# Patient Record
Sex: Female | Born: 1990 | Race: White | Hispanic: No | Marital: Single
Health system: Midwestern US, Community
[De-identification: ages and names within clinical notes are randomized; demographics above are authoritative.]

## PROBLEM LIST (undated history)

## (undated) DIAGNOSIS — F419 Anxiety disorder, unspecified: Secondary | ICD-10-CM

## (undated) DIAGNOSIS — D649 Anemia, unspecified: Secondary | ICD-10-CM

## (undated) DIAGNOSIS — Z789 Other specified health status: Secondary | ICD-10-CM

## (undated) DIAGNOSIS — F329 Major depressive disorder, single episode, unspecified: Secondary | ICD-10-CM

## (undated) DIAGNOSIS — F32A Depression, unspecified: Secondary | ICD-10-CM

## (undated) HISTORY — DX: Other specified health status: Z78.9

## (undated) HISTORY — DX: Anxiety disorder, unspecified: F41.9

## (undated) HISTORY — PX: OTHER SURGICAL HISTORY: SHX169

## (undated) HISTORY — PX: APPENDECTOMY: SHX54

## (undated) HISTORY — DX: Major depressive disorder, single episode, unspecified: F32.9

## (undated) HISTORY — DX: Depression, unspecified: F32.A

## (undated) HISTORY — DX: Anemia, unspecified: D64.9

---
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
## 2009-07-29 ENCOUNTER — Emergency Department (HOSPITAL_COMMUNITY): Admission: EM | Admit: 2009-07-29 | Discharge: 2009-07-29 | Payer: Self-pay | Admitting: Emergency Medicine

## 2010-12-11 LAB — POCT PREGNANCY, URINE: Preg Test, Ur: NEGATIVE

## 2010-12-11 LAB — POCT I-STAT, CHEM 8
BUN: 5 mg/dL — ABNORMAL LOW (ref 6–23)
Calcium, Ion: 1.05 mmol/L — ABNORMAL LOW (ref 1.12–1.32)
Glucose, Bld: 114 mg/dL — ABNORMAL HIGH (ref 70–99)
TCO2: 22 mmol/L (ref 0–100)

## 2010-12-11 LAB — RAPID URINE DRUG SCREEN, HOSP PERFORMED
Benzodiazepines: NOT DETECTED
Cocaine: NOT DETECTED
Tetrahydrocannabinol: NOT DETECTED

## 2010-12-11 LAB — ETHANOL: Alcohol, Ethyl (B): 182 mg/dL — ABNORMAL HIGH (ref 0–10)

## 2011-01-21 NOTE — H&P (Signed)
NAMEKRISHANA, LUTZE              ACCOUNT NO.:  [id number]   MEDICAL RECORD NO.:  [id number]          PATIENT TYPE:  EMS   LOCATION:  MAJO                         FACILITY:  [facility]   PHYSICIAN:  Clovis Pu. Cornett, M.D.DATE OF BIRTH:  1991/01/27   DATE OF ADMISSION:  04/20/2008  DATE OF DISCHARGE:                              HISTORY & PHYSICAL   CHIEF COMPLAINT:  Right lower quadrant pain.   HISTORY OF PRESENT ILLNESS:  The patient is an 20 year old female with  the onset of abdominal pain yesterday at about 3 o'clock.  The pain now  is in her right lower quadrant and is sharp in nature.  It is a 7 or 8  out of 10, especially with palpation.  There is no radiation.  The pain  is not radiating.  It is sharp in nature.  It is made worse with  movement and palpation.  Denies any dysuria or back pain.  She was seen  in the emergency room, and CT scan showed acute appendicitis.  I was  asked to see her for this reason.   PAST MEDICAL HISTORY:  None.   PAST SURGICAL HISTORY:  None.   FAMILY HISTORY:  Noncontributory.   SOCIAL HISTORY:  Denies tobacco use, does drink alcohol occasionally.   ALLERGIES:  AMOXICILLIN and PENICILLIN.   MEDICATIONS:  Currently none.   REVIEW OF SYSTEMS:  Negative x15.   PHYSICAL EXAMINATION:  VITAL SIGNS:  Temperature is 97, pulse 75, blood  pressure 112/68.  GENERAL APPEARANCE:  She is a pleasant female in no distress,  accompanied by her mother.  HEENT:  Extraocular movements are intact.  Oropharynx is clear.  No  evidence of jaundice.  NECK:  Supple, nontender.  Trachea midline.  CHEST:  Lungs sounds are clear bilaterally.  Chest wall motion normal.  CARDIOVASCULAR:  Regular rate and rhythm without rub, murmur, or gallop.  Occasional PVC heard.  ABDOMEN:  Tender right lower quadrant, but positive rebound and guarding  at McBurney point.  No abscess.  No hernia.  EXTREMITIES:  Warm and well perfused.  Muscle tone normal.  Range of  motion  normal.  NEURO:  Glasgow Coma Scale is 15.  Motor and sensory functions are  grossly intact.   DIAGNOSTIC STUDIES:  I reviewed her CT scan of her abdomen and pelvis,  which showed significant amount of bowel rupture, abscess, and  perforation.   Her white count is 11,200 with no left shift, hemoglobin is 12.8.  Sodium 139, potassium 3.6, chloride 104, CO2 28, BUN 10, creatinine 0.7,  glucose 95.  Urine pregnancy test is negative.  Urinalysis is normal.   IMPRESSION:  Acute appendicitis.   PLAN:  She will need laparoscopic or open appendectomy this morning.  We  will get her admitted to the operating room for the above.  Risk of  bleeding, infection, injury to the organs, as well as a potential need  for open surgery were discussed.  I will speak with Dr. Zachery Dakins who is  the __________ to coordinate this.      Thomas A. Cornett, M.D.  Electronically Signed  TAC/MEDQ  D:  04/20/2008  T:  04/21/2008  Job:  [id number]

## 2011-01-21 NOTE — Op Note (Signed)
NAMETIAJA, HAGAN              ACCOUNT NO.:  [id number]   MEDICAL RECORD NO.:  [id number]          PATIENT TYPE:  INP   LOCATION:  3030                         FACILITY:  [facility]   PHYSICIAN:  Anselm Pancoast. Weatherly, M.D.DATE OF BIRTH:  05-18-1991   DATE OF PROCEDURE:  DATE OF DISCHARGE:                               OPERATIVE REPORT   PREOPERATIVE DIAGNOSIS:  Acute appendicitis.   POSTOPERATIVE DIAGNOSIS:  Acute appendicitis.   OPERATION:  Open appendectomy.   ANESTHESIA:  General.   SURGEON:  Anselm Pancoast. Zachery Dakins, MD   ASSISTANT:  Nurse.   HISTORY:  Donna Leach is an 20 year old female who presented to the  emergency room about midnight with about 12+ hours of increase in right  lower quadrant abdominal pain.  She was seen by the ER physician.  Lab  studies showed a mildly elevated white count.  CT showed acutely  inflamed appendix in the right lower quadrant.  It is not truly  retrocecal but it is kind of down under the tip of the cecum and I saw  her and because of her very thin structure and etc, gave the option of  whether to proceed with a laparoscopic or an open appendectomy.  She  elected for the open appendectomy and because of her size, I think we  can do it through a very small incision.  The patient had been started  on antibiotics per Dr. Luisa Hart who saw her at approximately 6 a.m. and  no additional antibiotics were given.  The patient was taken to the  operative suite, induction of general anesthesia endotracheal tube, oral  tube to the stomach, and the abdomen was prepped with Betadine solution  and draped in a sterile manner.  A small incision in the skin fold at  McBurney point area was made, sharply dissected into the skin and  subcutaneous Scarpa fascia.  The external oblique was split in direction  of its fibers.  The internal oblique and transverse side walls were  picked up and then very carefully opened into the peritoneum.  Two [employer] photographer were used and I switched these to the [employer]  and the cecum was right under the area.  We followed the cecum down and  the appendix which was not truly retrocecal but was kind of within the  mesentery medially, was kind of flipped up.  I started freeing up the  mesentery in the antegrade fashion then could bring the most distal  portion of the inflamed appendix, so I could unclamp it with Kelly's.  The  little pedicles were tied with 2-0 Vicryl.  The appendix was  inflamed on the distal 3/4th of it but not ruptured.  The base of the  appendix was crushed and tied with a 2-0 Vicryl.  The appendix removed,  stump inverted, purse-string suture with 3-0 silk and then the cecum  dropped back in its normal position.  There was minimal bleeding and the  peritoneum and transverse side wall was closed with a running 2-0  Vicryl.  The internal oblique was also with couple of stitches of  running 2-0 Vicryl.  The external oblique was closed with a running 2-0  Vicryl.  Scarpa fasc1ia was closed with a couple of 4-0 Vicryl.  Subcuticular stitches and three 1/2-inch Steri-Strips on the incision.  I measured the incision, it is approximately 3 cm in length and  hopefully the patient will be able to tolerate a liquid diet and may be  discharged later this afternoon or in the morning, hold that if she is  nauseous.  She is hoping to enroll at [employer] today, and her mother will be  talking with the people there to see if she can somehow or another be  given a delay, so she should be able to enroll tomorrow.  Sponge and  needle counts were correct.  Estimated blood loss was minimal.           ______________________________  Anselm Pancoast. Zachery Dakins, M.D.     WJW/MEDQ  D:  04/20/2008  T:  04/21/2008  Job:  [id number]

## 2011-01-24 NOTE — Discharge Summary (Signed)
NAMESIMRANJIT, Donna Leach              ACCOUNT NO.:  [id number]   MEDICAL RECORD NO.:  [id number]          PATIENT TYPE:  INP   LOCATION:  3030                         FACILITY:  [facility]   PHYSICIAN:  Anselm Pancoast. Weatherly, M.D.DATE OF BIRTH:  1990-09-26   DATE OF ADMISSION:  04/20/2008  DATE OF DISCHARGE:  04/21/2008                               DISCHARGE SUMMARY   ADMITTING PHYSICIAN:  Maisie Fus A. Cornett, M.D.   DISCHARGING PHYSICIAN:  Anselm Pancoast. Zachery Dakins, MD   REASON FOR ADMISSION:  Ms. Smoots is an 20 year old female patient  who developed right lower quadrant pain.  On the date of admission, she  presented to the ER where she was found to have clinical exam and  tenderness of the right lower quadrant consistent with acute  appendicitis.  Her white count was 11,200.  CT scan was performed that  demonstrated appendicitis with abscess and possible perforation.   The patient was admitted with following diagnoses:  Acute appendicitis,  possible perforation.   HOSPITAL COURSE:  The patient was admitted during the night by Dr.  Luisa Hart in the early morning hours of April 20, 2008, Dr. Zachery Dakins  assumed care of the patient and later took her to the operating room  where she underwent an open appendectomy for acute appendicitis.   By postop day #1, the patient was afebrile, vital signs were stable.  She was tolerating a solid diet.  Her abdomen was soft.  Bowel sounds  were present and Steri-Strips were in place over her surgical incision.  She was tolerating oral pain medications and it was determined that she  could be discharged home with follow up later after discharge.   FINAL DISCHARGE DIAGNOSIS:  Acute appendicitis, status post appendectomy  per Dr. Zachery Dakins.   DISCHARGE MEDICATIONS:  Vicodin 5/325 1-2 tablets every 4-6 hours as  needed for pain, over-the-counter ibuprofen 1-3 tabs every 8 hours as  needed for pain, may take an addition of Vicodin, always take with  food.   WOUND CARE:  May shower beginning Saturday.   DIET:  No restrictions.   ACTIVITY:  Return to school next week, otherwise, no lifting greater  than 10 pounds for 2 weeks, no driving for 1 week.   FOLLOW UP APPOINTMENT:  She is to see Dr. Zachery Dakins in the office on  May 02, 2008, at 2:45 p.m.   OTHER INSTRUCTIONS:  She is to call the surgeon if fever greater than or  equal to 101.5 degrees Fahrenheit, pain, nausea, vomiting, diarrhea, see  redness or drainage from wounds.      Allison L. Rennis Harding, N.P.    ______________________________  Anselm Pancoast. Zachery Dakins, M.D.    ALE/MEDQ  D:  05/30/2008  T:  05/31/2008  Job:  [id number]

## 2011-06-06 LAB — DIFFERENTIAL
Eosinophils Relative: 1
Lymphocytes Relative: 25
Monocytes Absolute: 0.7
Monocytes Relative: 6
Neutro Abs: 7.6

## 2011-06-06 LAB — COMPREHENSIVE METABOLIC PANEL
AST: 17
Albumin: 4.2
BUN: 10
Chloride: 104
Creatinine, Ser: 0.73
GFR calc Af Amer: >60
Potassium: 3.6
Total Bilirubin: 1
Total Protein: 6.7

## 2011-06-06 LAB — URINALYSIS, ROUTINE W REFLEX MICROSCOPIC
Nitrite: NEGATIVE
Protein, ur: NEGATIVE
Specific Gravity, Urine: 1.008
Urobilinogen, UA: 1

## 2011-06-06 LAB — CBC
HCT: 36.5
Platelets: 205
RDW: 12.2
WBC: 11.2 — ABNORMAL HIGH

## 2011-06-06 LAB — POCT PREGNANCY, URINE: Preg Test, Ur: NEGATIVE

## 2011-08-28 ENCOUNTER — Ambulatory Visit (INDEPENDENT_AMBULATORY_CARE_PROVIDER_SITE_OTHER): Payer: BC Managed Care – PPO

## 2011-08-28 DIAGNOSIS — B9789 Other viral agents as the cause of diseases classified elsewhere: Secondary | ICD-10-CM

## 2011-12-03 ENCOUNTER — Encounter (INDEPENDENT_AMBULATORY_CARE_PROVIDER_SITE_OTHER): Payer: BC Managed Care – PPO | Admitting: Obstetrics and Gynecology

## 2011-12-03 DIAGNOSIS — Z30017 Encounter for initial prescription of implantable subdermal contraceptive: Secondary | ICD-10-CM

## 2011-12-31 ENCOUNTER — Encounter: Payer: BC Managed Care – PPO | Admitting: Obstetrics and Gynecology

## 2012-01-21 ENCOUNTER — Telehealth: Payer: Self-pay

## 2012-01-21 NOTE — Telephone Encounter (Signed)
Dr. Merla Riches,  Donna Leach is in need of a letter for school stating that she has been under a great deal of stress this past semester due to her father having cancer on top of already present anxiety and depression issues.

## 2012-01-26 ENCOUNTER — Encounter: Payer: Self-pay | Admitting: Internal Medicine

## 2012-01-26 NOTE — Telephone Encounter (Signed)
I routed a letter to [place] instead of clinical messaged pool/please let them know the letters ready and we can mail it to them or mail it to school or whomever needs it

## 2012-01-27 NOTE — Telephone Encounter (Signed)
Printed letter for pt and left in p/up drawer, but LMOM for pt to CB to let us know if she wants it mailed/faxed somewhere.

## 2012-01-27 NOTE — Telephone Encounter (Signed)
Left message to return call 

## 2012-01-28 NOTE — Telephone Encounter (Signed)
Spoke w/pt and she stated she will p/up letter.

## 2012-02-10 ENCOUNTER — Encounter: Payer: Self-pay | Admitting: Obstetrics and Gynecology

## 2012-02-11 ENCOUNTER — Ambulatory Visit (INDEPENDENT_AMBULATORY_CARE_PROVIDER_SITE_OTHER): Payer: BC Managed Care – PPO | Admitting: Obstetrics and Gynecology

## 2012-02-11 ENCOUNTER — Encounter: Payer: Self-pay | Admitting: Obstetrics and Gynecology

## 2012-02-11 VITALS — BP 98/52 | Wt 128.0 lb

## 2012-02-11 DIAGNOSIS — Z309 Encounter for contraceptive management, unspecified: Secondary | ICD-10-CM

## 2012-02-11 DIAGNOSIS — N390 Urinary tract infection, site not specified: Secondary | ICD-10-CM

## 2012-02-11 LAB — POCT URINALYSIS DIPSTICK
Bilirubin, UA: 1
Glucose, UA: NEGATIVE
Ketones, UA: NEGATIVE
Spec Grav, UA: 1.015
pH, UA: 5

## 2012-02-11 NOTE — Progress Notes (Signed)
F/u on nexplanon 12/03/11 Reports irregular spotting now improving C/O bladder pressure: U/A trace leukocytes now resolved  No other complaints Nexplanon nicely positioned in left arm  F-U AEX

## 2014-01-24 ENCOUNTER — Ambulatory Visit (INDEPENDENT_AMBULATORY_CARE_PROVIDER_SITE_OTHER): Payer: BC Managed Care – PPO | Admitting: Physician Assistant

## 2014-01-24 VITALS — BP 112/84 | HR 71 | Temp 99.1°F | Resp 20 | Ht 68.0 in | Wt 132.4 lb

## 2014-01-24 DIAGNOSIS — R350 Frequency of micturition: Secondary | ICD-10-CM

## 2014-01-24 DIAGNOSIS — R300 Dysuria: Secondary | ICD-10-CM

## 2014-01-24 DIAGNOSIS — N390 Urinary tract infection, site not specified: Secondary | ICD-10-CM

## 2014-01-24 DIAGNOSIS — R109 Unspecified abdominal pain: Secondary | ICD-10-CM

## 2014-01-24 LAB — POCT URINALYSIS DIPSTICK
Bilirubin, UA: NEGATIVE
Glucose, UA: NEGATIVE
Ketones, UA: NEGATIVE
Leukocytes, UA: NEGATIVE
Nitrite, UA: POSITIVE
Protein, UA: 30
Spec Grav, UA: 1.025
Urobilinogen, UA: 1
pH, UA: 5.5

## 2014-01-24 LAB — POCT CBC
Granulocyte percent: 63.6 %G (ref 37–80)
HCT, POC: 39.3 % (ref 37.7–47.9)
Hemoglobin: 12.9 g/dL (ref 12.2–16.2)
Lymph, poc: 2.1 (ref 0.6–3.4)
MCH, POC: 33 pg — AB (ref 27–31.2)
MCHC: 32.8 g/dL (ref 31.8–35.4)
MCV: 100.4 fL — AB (ref 80–97)
MID (cbc): 0.3 (ref 0–0.9)
MPV: 8.8 fL (ref 0–99.8)
POC Granulocyte: 4.3 (ref 2–6.9)
POC LYMPH PERCENT: 31.3 %L (ref 10–50)
POC MID %: 5.1 %M (ref 0–12)
Platelet Count, POC: 260 10*3/uL (ref 142–424)
RBC: 3.91 M/uL — AB (ref 4.04–5.48)
RDW, POC: 12.5 %
WBC: 6.7 10*3/uL (ref 4.6–10.2)

## 2014-01-24 LAB — POCT UA - MICROSCOPIC ONLY
Casts, Ur, LPF, POC: NEGATIVE
Crystals, Ur, HPF, POC: NEGATIVE
Mucus, UA: NEGATIVE
Yeast, UA: NEGATIVE

## 2014-01-24 MED ORDER — PHENAZOPYRIDINE HCL 200 MG PO TABS: 200.0000 mg | ORAL_TABLET | Freq: Three times a day (TID) | ORAL | Status: DC | PRN

## 2014-01-24 MED ORDER — CIPROFLOXACIN HCL 250 MG PO TABS: 250.0000 mg | ORAL_TABLET | Freq: Two times a day (BID) | ORAL | Status: DC

## 2014-01-24 NOTE — Progress Notes (Signed)
Subjective:    Patient ID: Donna SacramentoLena Leach, female    DOB: 06/01/1991, 23 y.o.   MRN: [id number]  HPI 23 year old female presents for evaluation of 1 month history of worsening UTI sx's.  States symptoms include urinary frequency and dysuria.  For the past 2-3 days, she has had significant worsening of symptoms to include flank pain, chills, subjective fever, and urinary urgency.  Denies nausea or vomiting.  Hx of UTI's in the past - takes cranberry juice tablets as needed. Last diagnosed UTI 2 years ago.   Patient is otherwise doing well with no other concerns today.     Review of Systems  Constitutional: Positive for fever (subjective) and chills.  Gastrointestinal: Positive for abdominal pain. Negative for nausea and vomiting.  Genitourinary: Positive for dysuria, frequency and flank pain.       Objective:   Physical Exam  Constitutional: She is oriented to person, place, and time. She appears well-developed and well-nourished.  HENT:  Head: Normocephalic and atraumatic.  Right Ear: External ear normal.  Left Ear: External ear normal.  Eyes: Conjunctivae are normal.  Neck: Normal range of motion.  Cardiovascular: Normal rate.   Pulmonary/Chest: Effort normal.  Abdominal: Soft. Bowel sounds are normal. There is no tenderness. There is CVA tenderness (minimal tenderness on right side). There is no rebound and no guarding.  Neurological: She is alert and oriented to person, place, and time.  Psychiatric: She has a normal mood and affect. Her behavior is normal. Judgment and thought content normal.     Results for orders placed in visit on 01/24/14  POCT CBC      Result Value Ref Range   WBC 6.7  4.6 - 10.2 K/uL   Lymph, poc 2.1  0.6 - 3.4   POC LYMPH PERCENT 31.3  10 - 50 %L   MID (cbc) 0.3  0 - 0.9   POC MID % 5.1  0 - 12 %M   POC Granulocyte 4.3  2 - 6.9   Granulocyte percent 63.6  37 - 80 %G   RBC 3.91 (*) 4.04 - 5.48 M/uL   Hemoglobin 12.9  12.2 - 16.2 g/dL   HCT, POC  40.939.3  81.137.7 - 47.9 %   MCV 100.4 (*) 80 - 97 fL   MCH, POC 33.0 (*) 27 - 31.2 pg   MCHC 32.8  31.8 - 35.4 g/dL   RDW, POC 91.412.5     Platelet Count, POC 260  142 - 424 K/uL   MPV 8.8  0 - 99.8 fL  POCT URINALYSIS DIPSTICK      Result Value Ref Range   Color, UA yellow     Clarity, UA clear     Glucose, UA neg     Bilirubin, UA neg     Ketones, UA neg     Spec Grav, UA 1.025     Blood, UA moderate     pH, UA 5.5     Protein, UA 30     Urobilinogen, UA 1.0     Nitrite, UA pos     Leukocytes, UA Negative    POCT UA - MICROSCOPIC ONLY      Result Value Ref Range   WBC, Ur, HPF, POC 1-5     RBC, urine, microscopic 4-12     Bacteria, U Microscopic 1+     Mucus, UA neg     Epithelial cells, urine per micros 0-3     Crystals, Ur, HPF, POC  neg     Casts, Ur, LPF, POC neg     Yeast, UA neg           Assessment & Plan:  Flank pain - Plan: POCT CBC, POCT urinalysis dipstick, POCT UA - Microscopic Only  Dysuria - Plan: POCT urinalysis dipstick, POCT UA - Microscopic Only  Urinary frequency - Plan: POCT urinalysis dipstick, POCT UA - Microscopic Only  Will treat as UTI - possible early pyelonephritis - with Cipro 250 mg bid x 7 days Pyridium 200 mg q8hours prn pain Push fluids Urine culture sent RTC precautions discussed.  Follow up if symptoms worsen or fail to improve.

## 2014-01-27 LAB — URINE CULTURE: Colony Count: 80000

## 2015-02-24 ENCOUNTER — Encounter (HOSPITAL_COMMUNITY): Payer: Self-pay | Admitting: *Deleted

## 2015-02-24 ENCOUNTER — Emergency Department (HOSPITAL_COMMUNITY)
Admission: EM | Admit: 2015-02-24 | Discharge: 2015-02-25 | Disposition: A | Discharge status: Home / Self Care | Payer: BLUE CROSS/BLUE SHIELD | Attending: Emergency Medicine | Admitting: Emergency Medicine

## 2015-02-24 DIAGNOSIS — S00402A Unspecified superficial injury of left ear, initial encounter: Secondary | ICD-10-CM | POA: Diagnosis present

## 2015-02-24 DIAGNOSIS — Y998 Other external cause status: Secondary | ICD-10-CM | POA: Diagnosis not present

## 2015-02-24 DIAGNOSIS — Z8659 Personal history of other mental and behavioral disorders: Secondary | ICD-10-CM | POA: Insufficient documentation

## 2015-02-24 DIAGNOSIS — Z880 Allergy status to penicillin: Secondary | ICD-10-CM | POA: Insufficient documentation

## 2015-02-24 DIAGNOSIS — Z720 Tobacco use: Secondary | ICD-10-CM | POA: Diagnosis not present

## 2015-02-24 DIAGNOSIS — W16812A Jumping or diving into other water striking water surface causing other injury, initial encounter: Secondary | ICD-10-CM | POA: Insufficient documentation

## 2015-02-24 DIAGNOSIS — S0922XA Traumatic rupture of left ear drum, initial encounter: Secondary | ICD-10-CM | POA: Diagnosis not present

## 2015-02-24 DIAGNOSIS — Y9315 Activity, underwater diving and snorkeling: Secondary | ICD-10-CM | POA: Diagnosis not present

## 2015-02-24 DIAGNOSIS — Y9289 Other specified places as the place of occurrence of the external cause: Secondary | ICD-10-CM | POA: Diagnosis not present
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
## 2015-02-25 MED ORDER — OFLOXACIN 0.3 % OT SOLN: 5.0000 [drp] | Freq: Two times a day (BID) | OTIC | Status: AC

## 2015-02-25 NOTE — Discharge Instructions (Signed)
You have a perforated ear drum on left ear.  Follow instruction below. If you are having worsening pain or fever, then use ear drops as prescribed.  Follow up with ENT as needed.  When shower, place a cotton ball with vaseline ointment to ear canal to help keep ear clean.  Take tylenol or ibuprofen as needed for pain.  Tympanic Membrane Perforation The eardrum (tympanic membrane) protects the inner ear from the outside environment. In addition to protection, the eardrum allows you to hear by transmitting sound waves to the bones in your ear and then to the nervous system. The tympanic membrane is easily perforated, which may result in damage to the inner ear. SYMPTOMS   Sometimes there are no symptoms.  Decreased hearing.  Fluid drainage from ear.  Ear pain. CAUSES   Most commonly, a middle ear infection from built-up pressure.  Injury from a cotton swab.  Traumatic injury to the side of the head. RISK INCREASES WITH:  Frequent middle ear infections.  Use of cotton swabs. PREVENTION   Do not use cotton swabs to clean the ear canal.  If you have ear pain or pressure, see your caregiver to rule out an ear infection that needs treatment. TREATMENT  Protecting the inner ear and allowing the membrane to heal on its own is how tympanic membrane rupture is usually treated. Healing may take several weeks. In order to protect the inner ear, do not allow any fluid to enter the ear canal. Avoid being submerged in water. The use of ear drops may prevent an ear infection from developing, but they should be used with caution, as ear drops can also cause damage to the inner ear. It is important to follow up with your caregiver to confirm healing of the tympanic membrane. If the membrane does not heal, permanent hearing loss may occur. To avoid serious complications, tympanic membranes that do not heal on their own are repaired with surgery. Document Released: 08/25/2005 Document Revised: 11/17/2011  Document Reviewed: 12/07/2008 [facility] Patient Information 2015 [place], [place]. This information is not intended to replace advice given to you by your health care provider. Make sure you discuss any questions you have with your health care provider.

## 2015-08-06 ENCOUNTER — Encounter: Payer: Self-pay | Admitting: Internal Medicine

## 2016-03-12 ENCOUNTER — Ambulatory Visit (INDEPENDENT_AMBULATORY_CARE_PROVIDER_SITE_OTHER): Payer: PRIVATE HEALTH INSURANCE | Admitting: Family Medicine

## 2016-03-12 VITALS — BP 114/78 | HR 89 | Temp 98.2°F | Resp 18 | Ht 68.0 in | Wt 130.0 lb

## 2016-03-12 DIAGNOSIS — R599 Enlarged lymph nodes, unspecified: Secondary | ICD-10-CM

## 2016-03-12 DIAGNOSIS — J029 Acute pharyngitis, unspecified: Secondary | ICD-10-CM

## 2016-03-12 DIAGNOSIS — R591 Generalized enlarged lymph nodes: Secondary | ICD-10-CM

## 2016-03-12 LAB — POCT RAPID STREP A (OFFICE): Rapid Strep A Screen: NEGATIVE

## 2016-03-12 NOTE — Patient Instructions (Addendum)
IF you received an x-ray today, you will receive an invoice from [facility]. Please contact [facility] at [phone] with questions or concerns regarding your invoice.   IF you received labwork today, you will receive an invoice from [facility]. Please contact [facility] at [phone] with questions or concerns regarding your invoice.   Our billing staff will not be able to assist you with questions regarding bills from these companies.  You will be contacted with the lab results as soon as they are available. The fastest way to get your results is to activate your My Chart account. Instructions are located on the last page of this paperwork. If you have not heard from us regarding the results in 2 weeks, please contact this office.    Your strep test was negative, so I will send out the throat culture. For now try sore throat lozenges, Tylenol or Motrin as needed, and fluids as much as possible. If the swollen lymph node is not improving in the next 2 weeks, could consider other testing or treatment, or return sooner if increase in size or worsening.  Return to the clinic or go to the nearest emergency room if any of your symptoms worsen or new symptoms occur.  Sore Throat A sore throat is pain, burning, irritation, or scratchiness of the throat. There is often pain or tenderness when swallowing or talking. A sore throat may be accompanied by other symptoms, such as coughing, sneezing, fever, and swollen neck glands. A sore throat is often the first sign of another sickness, such as a cold, flu, strep throat, or mononucleosis (commonly known as mono). Most sore throats go away without medical treatment. CAUSES  The most common causes of a sore throat include:  A viral infection, such as a cold, flu, or mono.  A bacterial infection, such as strep throat, tonsillitis, or whooping cough.  Seasonal allergies.  Dryness in the  air.  Irritants, such as smoke or pollution.  Gastroesophageal reflux disease (GERD). HOME CARE INSTRUCTIONS   Only take over-the-counter medicines as directed by your caregiver.  Drink enough fluids to keep your urine clear or pale yellow.  Rest as needed.  Try using throat sprays, lozenges, or sucking on hard candy to ease any pain (if older than 4 years or as directed).  Sip warm liquids, such as broth, herbal tea, or warm water with honey to relieve pain temporarily. You may also eat or drink cold or frozen liquids such as frozen ice pops.  Gargle with salt water (mix 1 tsp salt with 8 oz of water).  Do not smoke and avoid secondhand smoke.  Put a cool-mist humidifier in your bedroom at night to moisten the air. You can also turn on a hot shower and sit in the bathroom with the door closed for 5-10 minutes. SEEK IMMEDIATE MEDICAL CARE IF:  You have difficulty breathing.  You are unable to swallow fluids, soft foods, or your saliva.  You have increased swelling in the throat.  Your sore throat does not get better in 7 days.  You have nausea and vomiting.  You have a fever or persistent symptoms for more than 2-3 days.  You have a fever and your symptoms suddenly get worse. MAKE SURE YOU:   Understand these instructions.  Will watch your condition.  Will get help right away if you are not doing well or get worse.   This information is not intended to replace advice given to  you by your health care provider. Make sure you discuss any questions you have with your health care provider.   Document Released: 10/02/2004 Document Revised: 09/15/2014 Document Reviewed: 05/02/2012 Elsevier Interactive Patient Education 2016 Elsevier Inc.  Lymphadenopathy Lymphadenopathy refers to swollen or enlarged lymph glands, also called lymph nodes. Lymph glands are part of your body's defense (immune) system, which protects the body from infections, germs, and diseases. Lymph glands  are found in many locations in your body, including the neck, underarm, and groin.  Many things can cause lymph glands to become enlarged. When your immune system responds to germs, such as viruses or bacteria, infection-fighting cells and fluid build up. This causes the glands to grow in size. Usually, this is not something to worry about. The swelling and any soreness often go away without treatment. However, swollen lymph glands can also be caused by a number of diseases. Your health care provider may do various tests to help determine the cause. If the cause of your swollen lymph glands cannot be found, it is important to monitor your condition to make sure the swelling goes away. HOME CARE INSTRUCTIONS Watch your condition for any changes. The following actions may help to lessen any discomfort you are feeling:  Get plenty of rest.  Take medicines only as directed by your health care provider. Your health care provider may recommend over-the-counter medicines for pain.  Apply moist heat compresses to the site of swollen lymph nodes as directed by your health care provider. This can help reduce any pain.  Check your lymph nodes daily for any changes.  Keep all follow-up visits as directed by your health care provider. This is important. SEEK MEDICAL CARE IF:  Your lymph nodes are still swollen after 2 weeks.  Your swelling increases or spreads to other areas.  Your lymph nodes are hard, seem fixed to the skin, or are growing rapidly.  Your skin over the lymph nodes is red and inflamed.  You have a fever.  You have chills.  You have fatigue.  You develop a sore throat.  You have abdominal pain.  You have weight loss.  You have night sweats. SEEK IMMEDIATE MEDICAL CARE IF:  You notice fluid leaking from the area of the enlarged lymph node.  You have severe pain in any area of your body.  You have chest pain.  You have shortness of breath.   This information is not  intended to replace advice given to you by your health care provider. Make sure you discuss any questions you have with your health care provider.   Document Released: 06/03/2008 Document Revised: 09/15/2014 Document Reviewed: 03/30/2014 Elsevier Interactive Patient Education [employer]2016 Elsevier Inc.

## 2016-03-12 NOTE — Progress Notes (Signed)
By signing my name below I, Shelah Lewandowsky, attest that this documentation has been prepared under the direction and in the presence of Shade Flood, MD. Electonically Signed. Shelah Lewandowsky, Scribe 03/12/2016 at 5:15 PM  Subjective:    Patient ID: Donna Leach, female    DOB: [birth year], 25 y.o.   MRN: [id number]  Chief Complaint  Patient presents with  . Sore Throat    x 1day    HPI Donna Leach is a 25 y.o. female who presents to the [facility] complaining of sore throat for the past day. Pt also reports bilat ear soreness. Pt denies any fever. Pt denies any known sick contacts. Pt has been drinking fluids. Pt states she coughed up a minor amount of blood once this morning. Pt denies any runny nose or nasal congestion. Pt denies trying any OTC medications or honey or teas.  Pt also reports having swollen lymph node on the left side of her neck that was first noticed after the sore throat started.   Pt denies any recent cat scratch or bite. Pt also denies any scratches or rashes.  PT states that she is only taking 10mg  zoloft. Pt states that she occasionally uses her trazodone at night.    Pt works as a Manufacturing engineer.  There are no active problems to display for this patient.  Past Medical History  Diagnosis Date  . No pertinent past medical history   . Depression    Past Surgical History  Procedure Laterality Date  . Appendectomy    . Adnoids     Allergies  Allergen Reactions  . Amoxicillin Hives  . Penicillins Hives  . Suprax [Cefixime]    Prior to Admission medications   Medication Sig Start Date End Date Taking? Authorizing Provider  etonogestrel (NEXPLANON) 68 MG IMPL implant Inject 1 each into the skin once. 11/11/11  Yes Historical Provider, MD  sertraline (ZOLOFT) 25 MG tablet Take 25 mg by mouth daily.   Yes Historical Provider, MD  sertraline (ZOLOFT) 50 MG tablet Take 50 mg by mouth daily. 02/18/16   Historical Provider, MD    traZODone (DESYREL) 50 MG tablet TAKE 1 TO 3 TABLETS AT BEDTIME 02/18/16   Historical Provider, MD   Social History   Social History  . Marital Status: Single    Spouse Name: N/A  . Number of Children: N/A  . Years of Education: N/A   Occupational History  . Not on file.   Social History Main Topics  . Smoking status: Current Some Day Smoker -- 2 years  . Smokeless tobacco: Never Used     Comment: 3 cigs per day  . Alcohol Use: 0.5 oz/week    1 drink(s) per week  . Drug Use: No  . Sexual Activity: Yes    Birth Control/ Protection: Implant   Other Topics Concern  . Not on file   Social History Narrative      Review of Systems  Constitutional: Negative for fever.  HENT: Positive for ear pain and sore throat. Negative for congestion and rhinorrhea.   Respiratory: Positive for cough.   Skin: Negative for rash.  Hematological: Positive for adenopathy.       Objective:   Physical Exam  Constitutional: She is oriented to person, place, and time. She appears well-developed and well-nourished. No distress.  HENT:  Head: Normocephalic and atraumatic.  Right Ear: Hearing, tympanic membrane, external ear and ear canal normal.  Left Ear: Hearing and external  ear normal. Tympanic membrane is injected (minimal).  Nose: Nose normal.  Mouth/Throat: Posterior oropharyngeal erythema (minimal L>R) present. No oropharyngeal exudate.  No tonsillar hypertrophy.   Eyes: Conjunctivae and EOM are normal. Pupils are equal, round, and reactive to light.  Cardiovascular: Normal rate, regular rhythm, normal heart sounds and intact distal pulses.   No murmur heard. Pulmonary/Chest: Effort normal and breath sounds normal. No respiratory distress. She has no wheezes. She has no rhonchi.  Lymphadenopathy:  Pt has a few small, scattered, mobile lymph nodes on the left side of her neck behind the SCM.  Neurological: She is alert and oriented to person, place, and time.  Skin: Skin is warm and  dry. No rash noted.  Psychiatric: She has a normal mood and affect. Her behavior is normal.  Vitals reviewed.    Filed Vitals:   03/12/16 1614  BP: 114/78  Pulse: 89  Temp: 98.2 F (36.8 C)  TempSrc: Oral  Resp: 18  Height: 5\' 8"  (1.727 m)  Weight: 130 lb (58.968 kg)  SpO2: 98%   Results for orders placed or performed in visit on 03/12/16  POCT rapid strep A  Result Value Ref Range   Rapid Strep A Screen Negative Negative         Assessment & Plan:   Donna Leach is a 25 y.o. female Sore throat  Lymphadenopathy of head and neck - Plan: Culture, Group A Strep, POCT rapid strep A  Small mobile lymphadenopathy left neck, associated ear pain and sore throat. Possible early viral illness versus less likely also negative strep testing.   -Check throat culture. Symptomatic care discussed, RTC precautions given in after visit summary. Meds ordered this encounter  Medications  . sertraline (ZOLOFT) 25 MG tablet    Sig: Take 25 mg by mouth daily.  Marland Kitchen. DISCONTD: sertraline (ZOLOFT) 50 MG tablet    Sig: Take 50 mg by mouth daily.    Refill:  12  . traZODone (DESYREL) 50 MG tablet    Sig: TAKE 1 TO 3 TABLETS AT BEDTIME    Refill:  12   Patient Instructions       IF you received an x-ray today, you will receive an invoice from [facility]. Please contact [facility] at [phone] with questions or concerns regarding your invoice.   IF you received labwork today, you will receive an invoice from [facility]. Please contact [facility] at [phone] with questions or concerns regarding your invoice.   Our billing staff will not be able to assist you with questions regarding bills from these companies.  You will be contacted with the lab results as soon as they are available. The fastest way to get your results is to activate your My Chart account. Instructions are located on the last page of this paperwork. If you have not heard  from us regarding the results in 2 weeks, please contact this office.    Your strep test was negative, so I will send out the throat culture. For now try sore throat lozenges, Tylenol or Motrin as needed, and fluids as much as possible. If the swollen lymph node is not improving in the next 2 weeks, could consider other testing or treatment, or return sooner if increase in size or worsening.  Return to the clinic or go to the nearest emergency room if any of your symptoms worsen or new symptoms occur.  Sore Throat A sore throat is pain, burning, irritation, or scratchiness of the throat. There is  often pain or tenderness when swallowing or talking. A sore throat may be accompanied by other symptoms, such as coughing, sneezing, fever, and swollen neck glands. A sore throat is often the first sign of another sickness, such as a cold, flu, strep throat, or mononucleosis (commonly known as mono). Most sore throats go away without medical treatment. CAUSES  The most common causes of a sore throat include:  A viral infection, such as a cold, flu, or mono.  A bacterial infection, such as strep throat, tonsillitis, or whooping cough.  Seasonal allergies.  Dryness in the air.  Irritants, such as smoke or pollution.  Gastroesophageal reflux disease (GERD). HOME CARE INSTRUCTIONS   Only take over-the-counter medicines as directed by your caregiver.  Drink enough fluids to keep your urine clear or pale yellow.  Rest as needed.  Try using throat sprays, lozenges, or sucking on hard candy to ease any pain (if older than 4 years or as directed).  Sip warm liquids, such as broth, herbal tea, or warm water with honey to relieve pain temporarily. You may also eat or drink cold or frozen liquids such as frozen ice pops.  Gargle with salt water (mix 1 tsp salt with 8 oz of water).  Do not smoke and avoid secondhand smoke.  Put a cool-mist humidifier in your bedroom at night to moisten the air. You  can also turn on a hot shower and sit in the bathroom with the door closed for 5-10 minutes. SEEK IMMEDIATE MEDICAL CARE IF:  You have difficulty breathing.  You are unable to swallow fluids, soft foods, or your saliva.  You have increased swelling in the throat.  Your sore throat does not get better in 7 days.  You have nausea and vomiting.  You have a fever or persistent symptoms for more than 2-3 days.  You have a fever and your symptoms suddenly get worse. MAKE SURE YOU:   Understand these instructions.  Will watch your condition.  Will get help right away if you are not doing well or get worse.   This information is not intended to replace advice given to you by your health care provider. Make sure you discuss any questions you have with your health care provider.   Document Released: 10/02/2004 Document Revised: 09/15/2014 Document Reviewed: 05/02/2012 Elsevier Interactive Patient Education 2016 Elsevier Inc.  Lymphadenopathy Lymphadenopathy refers to swollen or enlarged lymph glands, also called lymph nodes. Lymph glands are part of your body's defense (immune) system, which protects the body from infections, germs, and diseases. Lymph glands are found in many locations in your body, including the neck, underarm, and groin.  Many things can cause lymph glands to become enlarged. When your immune system responds to germs, such as viruses or bacteria, infection-fighting cells and fluid build up. This causes the glands to grow in size. Usually, this is not something to worry about. The swelling and any soreness often go away without treatment. However, swollen lymph glands can also be caused by a number of diseases. Your health care provider may do various tests to help determine the cause. If the cause of your swollen lymph glands cannot be found, it is important to monitor your condition to make sure the swelling goes away. HOME CARE INSTRUCTIONS Watch your condition for any  changes. The following actions may help to lessen any discomfort you are feeling:  Get plenty of rest.  Take medicines only as directed by your health care provider. Your health care provider may  recommend over-the-counter medicines for pain.  Apply moist heat compresses to the site of swollen lymph nodes as directed by your health care provider. This can help reduce any pain.  Check your lymph nodes daily for any changes.  Keep all follow-up visits as directed by your health care provider. This is important. SEEK MEDICAL CARE IF:  Your lymph nodes are still swollen after 2 weeks.  Your swelling increases or spreads to other areas.  Your lymph nodes are hard, seem fixed to the skin, or are growing rapidly.  Your skin over the lymph nodes is red and inflamed.  You have a fever.  You have chills.  You have fatigue.  You develop a sore throat.  You have abdominal pain.  You have weight loss.  You have night sweats. SEEK IMMEDIATE MEDICAL CARE IF:  You notice fluid leaking from the area of the enlarged lymph node.  You have severe pain in any area of your body.  You have chest pain.  You have shortness of breath.   This information is not intended to replace advice given to you by your health care provider. Make sure you discuss any questions you have with your health care provider.   Document Released: 06/03/2008 Document Revised: 09/15/2014 Document Reviewed: 03/30/2014 Elsevier Interactive Patient Education [employer] Elsevier Inc.     I personally performed the services described in this documentation, which was scribed in my presence. The recorded information has been reviewed and considered, and addended by me as needed.   Signed,   Meredith StaggersJeffrey Kadelyn Dimascio, MD [facility].  03/12/2016 5:24 PM

## 2016-03-14 LAB — CULTURE, GROUP A STREP: ORGANISM ID, BACTERIA: NORMAL

## 2016-04-08 ENCOUNTER — Ambulatory Visit (INDEPENDENT_AMBULATORY_CARE_PROVIDER_SITE_OTHER): Payer: PRIVATE HEALTH INSURANCE | Admitting: Family Medicine

## 2016-04-08 VITALS — BP 110/72 | HR 73 | Temp 98.3°F | Resp 16 | Ht 68.0 in | Wt 129.0 lb

## 2016-04-08 DIAGNOSIS — R590 Localized enlarged lymph nodes: Secondary | ICD-10-CM | POA: Insufficient documentation

## 2016-04-08 DIAGNOSIS — R599 Enlarged lymph nodes, unspecified: Secondary | ICD-10-CM | POA: Diagnosis not present

## 2016-04-08 DIAGNOSIS — R5383 Other fatigue: Secondary | ICD-10-CM

## 2016-04-08 DIAGNOSIS — F329 Major depressive disorder, single episode, unspecified: Secondary | ICD-10-CM

## 2016-04-08 DIAGNOSIS — R490 Dysphonia: Secondary | ICD-10-CM

## 2016-04-08 DIAGNOSIS — F32A Depression, unspecified: Secondary | ICD-10-CM

## 2016-04-08 NOTE — Assessment & Plan Note (Signed)
Persistent LAD with ongoing symptoms, will check CBC, epstein-barre, HIV.  Depression flare considered as well.  Follow up next week.   

## 2016-04-08 NOTE — Patient Instructions (Addendum)
  Please follow up 1 week to go over lab work.  Avoid contact with pregnant women for time being.  Continue fluids/rest.   IF you received an x-ray today, you will receive an invoice from [facility]. Please contact [facility] at [phone] with questions or concerns regarding your invoice.   IF you received labwork today, you will receive an invoice from [facility]. Please contact [facility] at [phone] with questions or concerns regarding your invoice.   Our billing staff will not be able to assist you with questions regarding bills from these companies.  You will be contacted with the lab results as soon as they are available. The fastest way to get your results is to activate your My Chart account. Instructions are located on the last page of this paperwork. If you have not heard from [place] regarding the results in 2 weeks, please contact this office.

## 2016-04-08 NOTE — Progress Notes (Signed)
Subjective:    Patient ID: Donna Leach, female    DOB: [birth year], 25 y.o.   MRN: [id number]  HPI Presents in follow up for lymphadenopathy and continuation of fatigue and some upper respiratory symptoms.  She reports the lymph nodes have not changed in size.  She endorses some ear pain, but less than previous and minimal sore throat. She has noticed that her voice has become more hoarse than previous.  She denies cough, chest pain.  Has been feeling more fatigue when climbing stairs.  No fevers, chills, change in weight, bone pain.  She is a smoker.  She is sexually active with 1 partner and they have been monogamous for years.  She has had only 1 sexual partner.   She has a history of depression and has a psychiatrist that she sees on a regular basis.  She denies SI, HI.  She states that she feels more depressed when she does not exercise as much.  She recently went on some trips over the past couple weekends, which were in different time zones.  She does not feel as though she has completely adjusted to being back home.     Review of Systems  Constitutional: Positive for fatigue. Negative for chills and fever.  HENT: Negative for congestion and rhinorrhea.   Respiratory: Positive for chest tightness. Negative for cough and shortness of breath.   Cardiovascular: Negative for chest pain and leg swelling.  Gastrointestinal: Negative for abdominal pain and nausea.  Genitourinary: Negative for dysuria, urgency and vaginal discharge.  Musculoskeletal: Negative for arthralgias, joint swelling and myalgias.  Skin: Negative for rash and wound.  Psychiatric/Behavioral: Negative for agitation and confusion.  All other systems reviewed and are negative.      Objective:   Physical Exam  Constitutional: She is oriented to person, place, and time. She appears well-developed and well-nourished. No distress.  HENT:  Head: Normocephalic and atraumatic.  Right Ear: External ear normal.  Left Ear:  External ear normal.  Mild erythema in posterior oropharynx TM without effusion, retraction Nares without erythema, discharge  Neck: Normal range of motion. Neck supple.  0.5cm cervical LN in posterior cervical chain with 2 0.2 cm LN superior on the left No LAD in right cervical chain, pre/postauricular, occipital, supraclavicular  Cardiovascular: Normal rate, regular rhythm, normal heart sounds and intact distal pulses.  Exam reveals no gallop and no friction rub.   No murmur heard. Pulmonary/Chest: Effort normal and breath sounds normal. No respiratory distress. She has no wheezes. She has no rales. She exhibits no tenderness.  Musculoskeletal: Normal range of motion. She exhibits no edema.  Lymphadenopathy:    She has cervical adenopathy.  Neurological: She is alert and oriented to person, place, and time.  Skin: Skin is warm. No rash noted. She is not diaphoretic. No erythema.  Psychiatric: She has a normal mood and affect. Her behavior is normal. Judgment and thought content normal.  Nursing note and vitals reviewed.  BP 110/72 (BP Location: Right Arm, Patient Position: Sitting, Cuff Size: Normal)   Pulse 73   Temp 98.3 F (36.8 C)   Resp 16   Ht  (1.727 m)   Wt 129 lb (58.5 kg)   LMP 04/08/2016   SpO2 100%   BMI 19.61 kg/m         Assessment & Plan:  Lymphadenopathy of left cervical region Persistent LAD with ongoing symptoms, will check CBC, epstein-barBrock Mokryepression flare considered as well.  Follow up next  week.    Hoarseness Will assess next week how this is doing and see if she needs to go to ENT, given that she is a smoker.    Other fatigue Persistent LAD with ongoing symptoms, will check CBC, epstein-barre, HIV.  Depression flare considered as well.  Follow up next week.    Depression This is managed by outside psychiatrist, her screen was positive because she has not been active and this is abnormal for her.

## 2016-04-08 NOTE — Assessment & Plan Note (Addendum)
Persistent LAD with ongoing symptoms, will check CBC, epstein-barre, HIV.  Depression flare considered as well.  Follow up next week.

## 2016-04-08 NOTE — Assessment & Plan Note (Signed)
This is managed by outside psychiatrist, her screen was positive because she has not been active and this is abnormal for her.

## 2016-04-08 NOTE — Assessment & Plan Note (Signed)
Will assess next week how this is doing and see if she needs to go to ENT, given that she is a smoker.

## 2016-04-09 LAB — CBC WITH DIFFERENTIAL/PLATELET
BASOS PCT: 0 %
Basophils Absolute: 0 cells/uL (ref 0–200)
EOS PCT: 1 %
Eosinophils Absolute: 83 cells/uL (ref 15–500)
HEMATOCRIT: 40.3 % (ref 35.0–45.0)
Hemoglobin: 13.9 g/dL (ref 11.7–15.5)
LYMPHS ABS: 2656 {cells}/uL (ref 850–3900)
LYMPHS PCT: 32 %
MCH: 33.5 pg — ABNORMAL HIGH (ref 27.0–33.0)
MCHC: 34.5 g/dL (ref 32.0–36.0)
MCV: 97.1 fL (ref 80.0–100.0)
MPV: 9.7 fL (ref 7.5–12.5)
Monocytes Absolute: 664 cells/uL (ref 200–950)
Monocytes Relative: 8 %
NEUTROS PCT: 59 %
Neutro Abs: 4897 cells/uL (ref 1500–7800)
Platelets: 254 10*3/uL (ref 140–400)
RBC: 4.15 MIL/uL (ref 3.80–5.10)
RDW: 12.8 % (ref 11.0–15.0)
WBC: 8.3 10*3/uL (ref 3.8–10.8)

## 2016-04-09 LAB — HIV ANTIBODY (ROUTINE TESTING W REFLEX): HIV 1&2 Ab, 4th Generation: NONREACTIVE

## 2016-04-09 NOTE — Progress Notes (Signed)
Note reviewed, and agree with documentation and plan. Patient examined with Dr. Zachery Dauer. Agree with assessment and plan of care per her note.   Signed,   Meredith Staggers, MD [facility].  04/09/16 8:44 AM

## 2016-04-10 LAB — EPSTEIN-BARR VIRUS VCA ANTIBODY PANEL
EBV NA IGG: 26.2 U/mL — AB
EBV VCA IGG: 149 U/mL — AB

## 2016-04-11 LAB — CYTOMEGALOVIRUS ANTIBODY, IGG: Cytomegalovirus Ab-IgG: <0.6 U/mL (ref <0.60)

## 2016-04-14 ENCOUNTER — Telehealth: Payer: Self-pay

## 2016-04-15 NOTE — Addendum Note (Signed)
Addended by: Cardell PeachHITANAND, Mirtie Bastyr B on: 04/15/2016 02:46 PM   Modules accepted: Orders

## 2016-04-16 NOTE — Telephone Encounter (Signed)
Phone message placed 

## 2016-07-14 ENCOUNTER — Telehealth: Payer: Self-pay

## 2016-07-14 DIAGNOSIS — R590 Localized enlarged lymph nodes: Secondary | ICD-10-CM

## 2016-07-14 NOTE — Telephone Encounter (Signed)
Patient is requesting a referral to ENT.   States she had one, we were unable to reach her so it was cancelled.    Please call her at [phone] ([phone]

## 2016-07-17 NOTE — Telephone Encounter (Signed)
Referrals, please complete referral again.  See phone message.

## 2016-10-02 ENCOUNTER — Ambulatory Visit (INDEPENDENT_AMBULATORY_CARE_PROVIDER_SITE_OTHER): Payer: PRIVATE HEALTH INSURANCE | Admitting: Physician Assistant

## 2016-10-02 VITALS — BP 122/80 | HR 75 | Temp 98.2°F | Resp 18 | Ht 68.0 in | Wt 134.2 lb

## 2016-10-02 DIAGNOSIS — R300 Dysuria: Secondary | ICD-10-CM

## 2016-10-02 DIAGNOSIS — R350 Frequency of micturition: Secondary | ICD-10-CM | POA: Diagnosis not present

## 2016-10-02 DIAGNOSIS — N3001 Acute cystitis with hematuria: Secondary | ICD-10-CM

## 2016-10-02 LAB — POCT URINALYSIS DIP (MANUAL ENTRY)
BILIRUBIN UA: NEGATIVE
BILIRUBIN UA: NEGATIVE
GLUCOSE UA: NEGATIVE
Nitrite, UA: POSITIVE — AB
Spec Grav, UA: 1.02
Urobilinogen, UA: 1
pH, UA: 7.5

## 2016-10-02 LAB — POC MICROSCOPIC URINALYSIS (UMFC): MUCUS RE: ABSENT

## 2016-10-02 LAB — POCT URINE PREGNANCY: PREG TEST UR: NEGATIVE

## 2016-10-02 MED ORDER — NITROFURANTOIN MONOHYD MACRO 100 MG PO CAPS: 100.0000 mg | ORAL_CAPSULE | Freq: Two times a day (BID) | ORAL | 0 refills | Status: DC

## 2016-10-02 NOTE — Progress Notes (Signed)
10/02/2016 2:34 PM   DOB: 11/15/1990 / MRN: [id number]  SUBJECTIVE:  Donna Leach is a 26 y.o. female presenting for urinary frequency along with some improved dysuria 2/2 to azo.  Denies fever, chills, nausea.  Denies changes in appetite.  Feels that she is getting worse.    She is concerned that she may have the flu as she has been exposed to her boss.   She is allergic to amoxicillin; penicillins; and suprax [cefixime].   She  has a past medical history of Depression and No pertinent past medical history.    She  reports that she has been smoking.  She has smoked for the past 2.00 years. She has never used smokeless tobacco. She reports that she drinks about 0.5 oz of alcohol per week . She reports that she does not use drugs. She  reports that she currently engages in sexual activity. She reports using the following method of birth control/protection: Implant. The patient  has a past surgical history that includes Appendectomy and Adnoids.  Her family history includes Diabetes in her father and mother; Hypertension in her father.  Review of Systems  Constitutional: Negative for chills and fever.  Respiratory: Negative for cough.   Gastrointestinal: Negative for nausea.  Genitourinary: Positive for dysuria, frequency, hematuria and urgency. Negative for flank pain.  Musculoskeletal: Positive for myalgias.  Skin: Negative for rash.  Neurological: Negative for dizziness.    The problem list and medications were reviewed and updated by myself where necessary and exist elsewhere in the encounter.   OBJECTIVE:  BP 122/80   Pulse 75   Temp 98.2 F (36.8 C) (Oral)   Resp 18   Ht 5\' 8"  (1.727 m)   Wt 134 lb 3.2 oz (60.9 kg)   SpO2 96%   BMI 20.41 kg/m   Physical Exam  Constitutional: She is oriented to person, place, and time.  Cardiovascular: Normal rate and regular rhythm.   Pulmonary/Chest: Effort normal and breath sounds normal.  Abdominal: Soft. Bowel sounds are normal.  She exhibits no distension and no mass. There is no tenderness. There is no rebound, no guarding and no CVA tenderness.  Musculoskeletal: Normal range of motion.  Neurological: She is alert and oriented to person, place, and time. No cranial nerve deficit.  Skin: Skin is warm and dry.  Psychiatric: She has a normal mood and affect.  Vitals reviewed.   Results for orders placed or performed in visit on 10/02/16 (from the past 72 hour(s))  POCT urinalysis dipstick     Status: Abnormal   Collection Time: 10/02/16  2:08 PM  Result Value Ref Range   Color, UA yellow yellow   Clarity, UA cloudy (A) clear   Glucose, UA negative negative   Bilirubin, UA negative negative   Ketones, POC UA negative negative   Spec Grav, UA 1.020    Blood, UA small (A) negative   pH, UA 7.5    Protein Ur, POC =100 (A) negative   Urobilinogen, UA 1.0    Nitrite, UA Positive (A) Negative   Leukocytes, UA small (1+) (A) Negative  POCT Microscopic Urinalysis ([facility])     Status: Abnormal   Collection Time: 10/02/16  2:18 PM  Result Value Ref Range   WBC,UR,HPF,POC Many (A) None WBC/hpf   RBC,UR,HPF,POC Few (A) None RBC/hpf   Bacteria Many (A) None, Too numerous to count   Mucus Absent Absent   Epithelial Cells, UR Per Microscopy Moderate (A) None, Too numerous to count cells/hpf  POCT urine pregnancy     Status: None   Collection Time: 10/02/16  2:18 PM  Result Value Ref Range   Preg Test, Ur Negative Negative    No results found.  ASSESSMENT AND PLAN:  [name] was seen today for hematuria, urinary frequency and back pain.  Diagnoses and all orders for this visit:  Dysuria -     POCT urinalysis dipstick -     POCT Microscopic Urinalysis ([facility])  Urinary frequency -     POCT urinalysis dipstick -     POCT Microscopic Urinalysis ([facility])  Acute cystitis with hematuria -     POCT urine pregnancy -     Urine culture -     nitrofurantoin, macrocrystal-monohydrate, (MACROBID) 100 MG capsule; Take 1  capsule (100 mg total) by mouth 2 (two) times daily.    The patient is advised to call or return to clinic if she does not see an improvement in symptoms, or to seek the care of the closest emergency department if she worsens with the above plan.   Deliah Boston, MHS, PA-C [facility] 10/02/2016 2:34 PM

## 2016-10-02 NOTE — Patient Instructions (Signed)
     IF you received an x-ray today, you will receive an invoice from [facility]. Please contact [facility] at [phone] with questions or concerns regarding your invoice.   IF you received labwork today, you will receive an invoice from [facility]. Please contact [facility] at [phone] with questions or concerns regarding your invoice.   Our billing staff will not be able to assist you with questions regarding bills from these companies.  You will be contacted with the lab results as soon as they are available. The fastest way to get your results is to activate your My Chart account. Instructions are located on the last page of this paperwork. If you have not heard from us regarding the results in 2 weeks, please contact this office.     

## 2016-10-04 LAB — URINE CULTURE

## 2016-11-06 ENCOUNTER — Ambulatory Visit (INDEPENDENT_AMBULATORY_CARE_PROVIDER_SITE_OTHER): Payer: PRIVATE HEALTH INSURANCE | Admitting: Emergency Medicine

## 2016-11-06 VITALS — BP 102/68 | HR 67 | Temp 98.2°F | Resp 16 | Ht 68.0 in | Wt 137.0 lb

## 2016-11-06 DIAGNOSIS — S6000XA Contusion of unspecified finger without damage to nail, initial encounter: Secondary | ICD-10-CM

## 2016-11-06 DIAGNOSIS — S60022A Contusion of left index finger without damage to nail, initial encounter: Secondary | ICD-10-CM

## 2016-11-06 NOTE — Progress Notes (Signed)
Donna Leach 26 y.o.   Chief Complaint  Patient presents with  . Bleeding/Bruising    Left hand 2nd digit, hit against weight, feels tingly per pt    HISTORY OF PRESENT ILLNESS: This is a 26 y.o. female complaining of injury to left index finger that happened yesterday.  Hand Pain   The incident occurred 2 days ago. The injury mechanism was a direct blow. The pain is present in the left fingers. The quality of the pain is described as aching. The pain does not radiate. The pain is at a severity of 4/10. The pain has been intermittent since the incident. Associated symptoms include tingling.     Prior to Admission medications   Medication Sig Start Date End Date Taking? Authorizing Provider  etonogestrel (NEXPLANON) 68 MG IMPL implant Inject 1 each into the skin once. 11/11/11  Yes Historical Provider, MD  sertraline (ZOLOFT) 25 MG tablet Take 25 mg by mouth daily.   Yes Historical Provider, MD  traZODone (DESYREL) 50 MG tablet TAKE 1 TO 3 TABLETS AT BEDTIME 02/18/16  Yes Historical Provider, MD  nitrofurantoin, macrocrystal-monohydrate, (MACROBID) 100 MG capsule Take 1 capsule (100 mg total) by mouth 2 (two) times daily. Patient not taking: Reported on 11/06/2016 10/02/16   Donna Neas, PA-C  Phenazopyridine HCl (AZO TABS PO) Take by mouth.    Historical Provider, MD    Allergies  Allergen Reactions  . Amoxicillin Hives  . Penicillins Hives  . Suprax [Cefixime]     Patient Active Problem List   Diagnosis Date Noted  . Traumatic hematoma of finger 11/06/2016  . Depression 04/08/2016    Past Medical History:  Diagnosis Date  . Depression   . No pertinent past medical history     Past Surgical History:  Procedure Laterality Date  . Adnoids    . APPENDECTOMY      Social History   Social History  . Marital status: Single    Spouse name: N/A  . Number of children: N/A  . Years of education: N/A   Occupational History  . Not on file.   Social History Main  Topics  . Smoking status: Current Some Day Smoker    Years: 2.00  . Smokeless tobacco: Never Used     Comment: 3 cigs per day  . Alcohol use 0.5 oz/week    1 drink(s) per week  . Drug use: No  . Sexual activity: Yes    Birth control/ protection: Implant   Other Topics Concern  . Not on file   Social History Narrative  . No narrative on file    Family History  Problem Relation Age of Onset  . Hypertension Father   . Diabetes Father   . Diabetes Mother      Review of Systems  Constitutional: Negative for chills and fever.  Respiratory: Negative for shortness of breath.   Gastrointestinal: Negative for nausea and vomiting.  Musculoskeletal:       +pain left index finger  Skin:       +bruising  Neurological: Positive for tingling.  All other systems reviewed and are negative.  Vitals:   11/06/16 1515  BP: 102/68  Pulse: 67  Resp: 16  Temp: 98.2 F (36.8 C)     Physical Exam  Constitutional: She is oriented to person, place, and time. She appears well-developed and well-nourished.  HENT:  Head: Normocephalic and atraumatic.  Eyes: EOM are normal. Pupils are equal, round, and reactive to light.  Neck: Normal  range of motion. Neck supple.  Cardiovascular: Normal rate.   Pulmonary/Chest: Effort normal.  Musculoskeletal:  Left index finger: +proximal and lateral tender hematoma with abrasion; NVI with FROM; mild swelling  Neurological: She is alert and oriented to person, place, and time.  Skin: Skin is warm and dry. Capillary refill takes less than 2 seconds.  Psychiatric: She has a normal mood and affect. Her behavior is normal.  Vitals reviewed.    ASSESSMENT & PLAN: Donna Leach was seen today for bleeding/bruising.  Diagnoses and all orders for this visit:  Contusion of left index finger without damage to nail, initial encounter  Traumatic hematoma of finger, initial encounter    Patient Instructions       IF you received an x-ray today, you will  [facility]. Please contact [facility] at [phone] with questions or concerns regarding your invoice.   IF you received labwork today, you will receive an invoice from [facility]. Please contact [facility] at [phone] with questions or concerns regarding your invoice.   Our billing staff will not be able to assist you with questions regarding bills from these companies.  You will be contacted with the lab results as soon as they are available. The fastest way to get your results is to activate your My Chart account. Instructions are located on the last page of this paperwork. If you have not heard from us regarding the results in 2 weeks, please contact this office.     Hematoma A hematoma is a collection of blood. The collection of blood can turn into a hard, painful lump under the skin. Your skin may turn blue or yellow if the hematoma is close to the surface of the skin. Most hematomas get better in a few days to weeks. Some hematomas are serious and need medical care. Hematomas can be very small or very big. Follow these instructions at home:  Apply ice to the injured area:  Put ice in a plastic bag.  Place a towel between your skin and the bag.  Leave the ice on for 20 minutes, 2-3 times a day for the first 1 to 2 days.  After the first 2 days, switch to using warm packs on the injured area.  Raise (elevate) the injured area to lessen pain and puffiness (swelling). You may also wrap the area with an elastic bandage. Make sure the bandage is not wrapped too tight.  If you have a painful hematoma on your leg or foot, you may use crutches for a couple days.  Only take medicines as told by your doctor. Get help right away if:  Your pain gets worse.  Your pain is not controlled with medicine.  You have a fever.  Your puffiness gets worse.  Your skin turns more blue or yellow.  Your skin over the hematoma breaks or starts  bleeding.  Your hematoma is in your chest or belly (abdomen) and you are short of breath, feel weak, or have a change in consciousness.  Your hematoma is on your scalp and you have a headache that gets worse or a change in alertness or consciousness. This information is not intended to replace advice given to you by your health care provider. Make sure you discuss any questions you have with your health care provider. Document Released: 10/02/2004 Document Revised: 01/31/2016 Document Reviewed: 02/02/2013 Elsevier Interactive Patient Education  2017 Elsevier Inc.      Edwina BarthMiguel Reagen Goates, MD [facility]

## 2016-11-06 NOTE — Patient Instructions (Addendum)
     IF you received an x-ray today, you will receive an invoice from [facility]. Please contact [facility] at [phone] with questions or concerns regarding your invoice.   IF you received labwork today, you will receive an invoice from [facility]. Please contact [facility] at [phone] with questions or concerns regarding your invoice.   Our billing staff will not be able to assist you with questions regarding bills from these companies.  You will be contacted with the lab results as soon as they are available. The fastest way to get your results is to activate your My Chart account. Instructions are located on the last page of this paperwork. If you have not heard from us regarding the results in 2 weeks, please contact this office.      Hematoma A hematoma is a collection of blood. The collection of blood can turn into a hard, painful lump under the skin. Your skin may turn blue or yellow if the hematoma is close to the surface of the skin. Most hematomas get better in a few days to weeks. Some hematomas are serious and need medical care. Hematomas can be very small or very big. Follow these instructions at home:  Apply ice to the injured area:  Put ice in a plastic bag.  Place a towel between your skin and the bag.  Leave the ice on for 20 minutes, 2-3 times a day for the first 1 to 2 days.  After the first 2 days, switch to using warm packs on the injured area.  Raise (elevate) the injured area to lessen pain and puffiness (swelling). You may also wrap the area with an elastic bandage. Make sure the bandage is not wrapped too tight.  If you have a painful hematoma on your leg or foot, you may use crutches for a couple days.  Only take medicines as told by your doctor. Get help right away if:  Your pain gets worse.  Your pain is not controlled with medicine.  You have a fever.  Your puffiness gets worse.  Your skin turns more blue or  yellow.  Your skin over the hematoma breaks or starts bleeding.  Your hematoma is in your chest or belly (abdomen) and you are short of breath, feel weak, or have a change in consciousness.  Your hematoma is on your scalp and you have a headache that gets worse or a change in alertness or consciousness. This information is not intended to replace advice given to you by your health care provider. Make sure you discuss any questions you have with your health care provider. Document Released: 10/02/2004 Document Revised: 01/31/2016 Document Reviewed: 02/02/2013 Elsevier Interactive Patient Education  2017 Elsevier Inc.  

## 2016-11-18 ENCOUNTER — Ambulatory Visit (INDEPENDENT_AMBULATORY_CARE_PROVIDER_SITE_OTHER): Payer: PRIVATE HEALTH INSURANCE

## 2016-11-18 ENCOUNTER — Ambulatory Visit (INDEPENDENT_AMBULATORY_CARE_PROVIDER_SITE_OTHER): Payer: PRIVATE HEALTH INSURANCE | Admitting: Physician Assistant

## 2016-11-18 VITALS — BP 112/68 | HR 63 | Temp 97.7°F | Resp 16 | Ht 68.0 in | Wt 138.0 lb

## 2016-11-18 DIAGNOSIS — M79602 Pain in left arm: Secondary | ICD-10-CM | POA: Diagnosis not present

## 2016-11-18 DIAGNOSIS — M79601 Pain in right arm: Secondary | ICD-10-CM

## 2016-11-18 LAB — POCT CBC
Granulocyte percent: 53.2 %G (ref 37–80)
HEMATOCRIT: 37.6 % — AB (ref 37.7–47.9)
HEMOGLOBIN: 13.3 g/dL (ref 12.2–16.2)
Lymph, poc: 2.7 (ref 0.6–3.4)
MCH, POC: 34.1 pg — AB (ref 27–31.2)
MCHC: 35.4 g/dL (ref 31.8–35.4)
MCV: 96 fL (ref 80–97)
MID (cbc): 0.4 (ref 0–0.9)
MPV: 7.7 fL (ref 0–99.8)
POC GRANULOCYTE: 3.5 (ref 2–6.9)
POC LYMPH PERCENT: 40.7 %L (ref 10–50)
POC MID %: 6.1 % (ref 0–12)
Platelet Count, POC: 204 10*3/uL (ref 142–424)
RBC: 3.92 M/uL — AB (ref 4.04–5.48)
RDW, POC: 12.1 %
WBC: 6.6 10*3/uL (ref 4.6–10.2)

## 2016-11-18 LAB — POCT URINALYSIS DIP (MANUAL ENTRY)
Bilirubin, UA: NEGATIVE
Blood, UA: NEGATIVE
GLUCOSE UA: NEGATIVE
Ketones, POC UA: NEGATIVE
NITRITE UA: NEGATIVE
PROTEIN UA: NEGATIVE
Spec Grav, UA: 1.01
UROBILINOGEN UA: 0.2
pH, UA: 7

## 2016-11-18 LAB — POCT URINE PREGNANCY: Preg Test, Ur: NEGATIVE

## 2016-11-18 IMAGING — DX DG CERVICAL SPINE COMPLETE 4+V
5 series · 5 of 5 positions shown · non-contrast
Comparison: None

CLINICAL DATA: BILATERAL arm pain for 4 days

EXAM:
CERVICAL SPINE - COMPLETE 4+ VIEW

[c-spine lat]
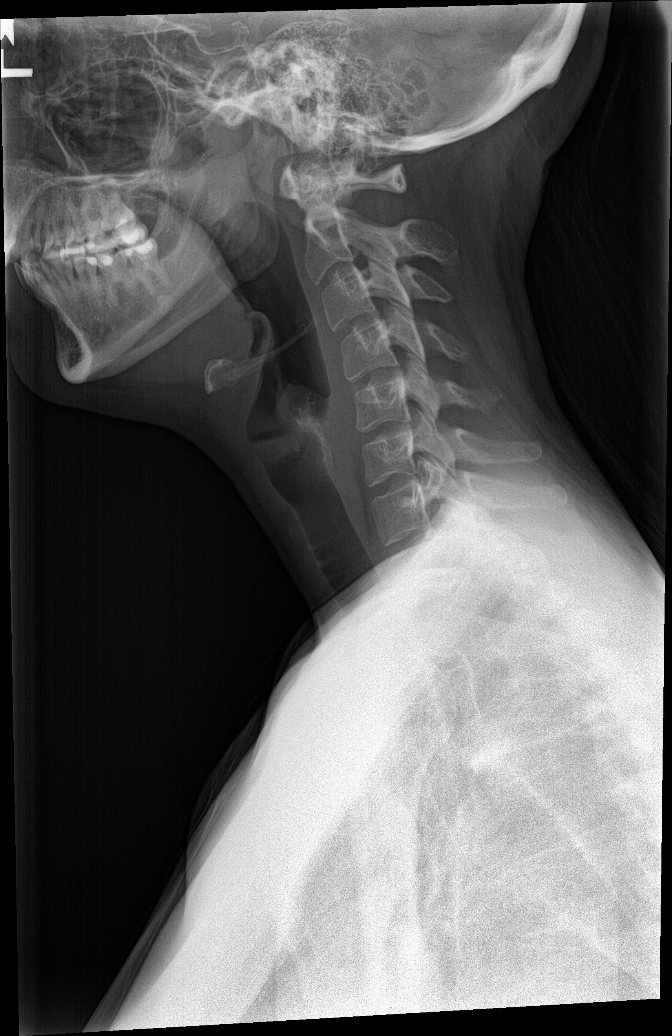

[c-spine obl (1 of 2)]
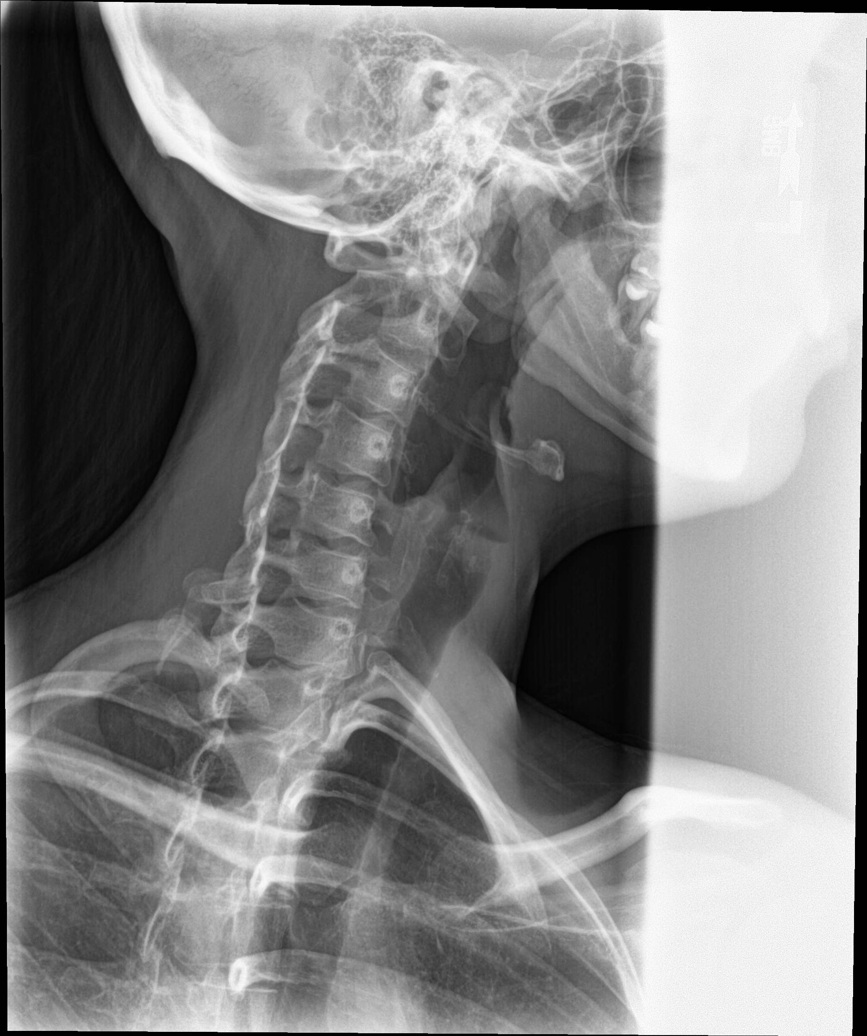

[c-spine obl (2 of 2)]
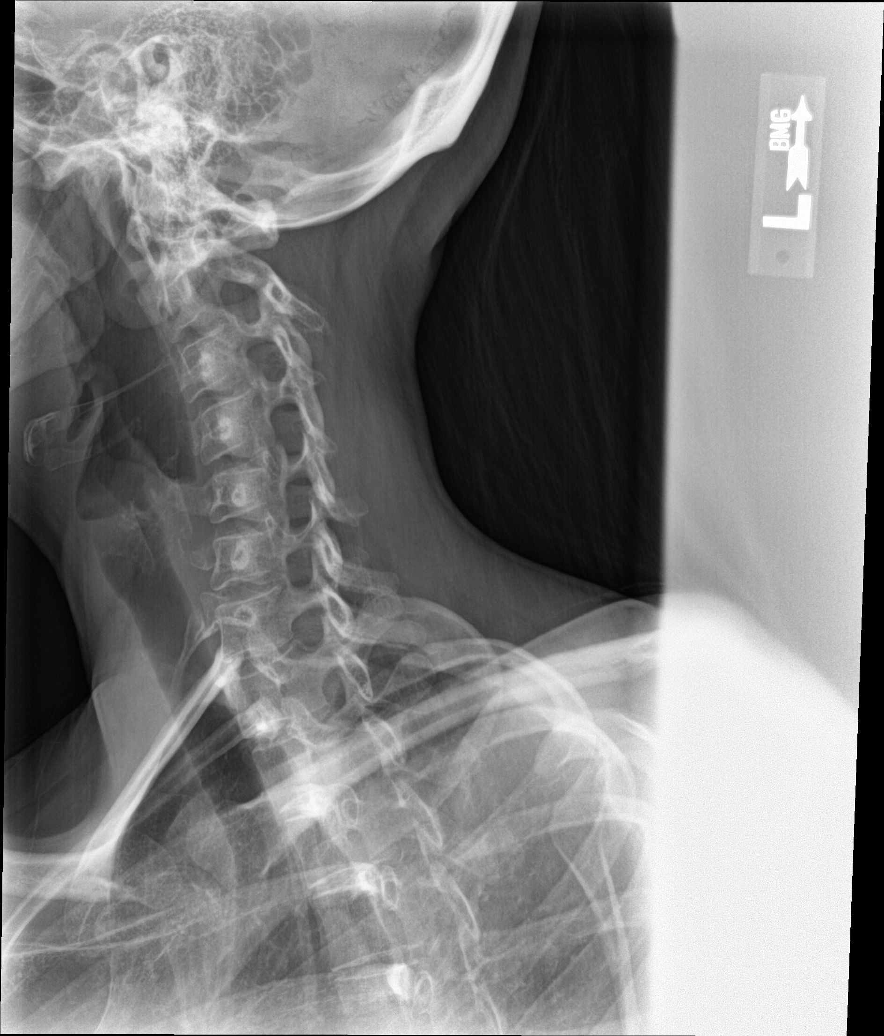

[c-spine ap]
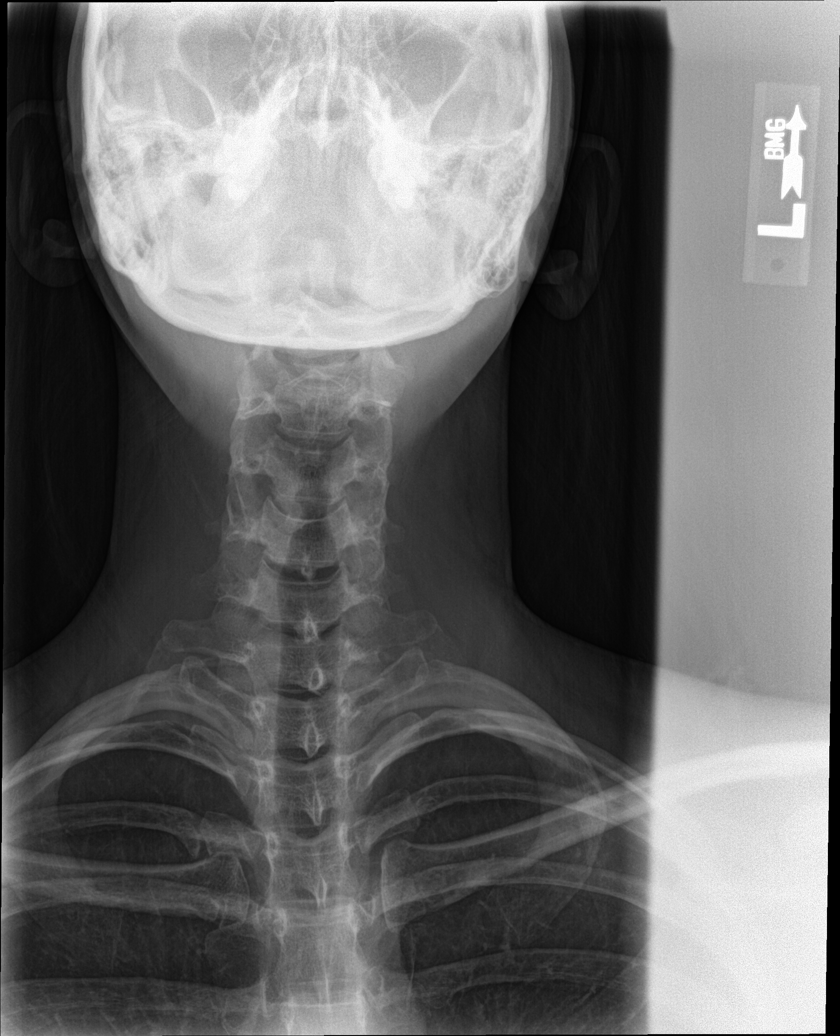

[c-spine open mouth]
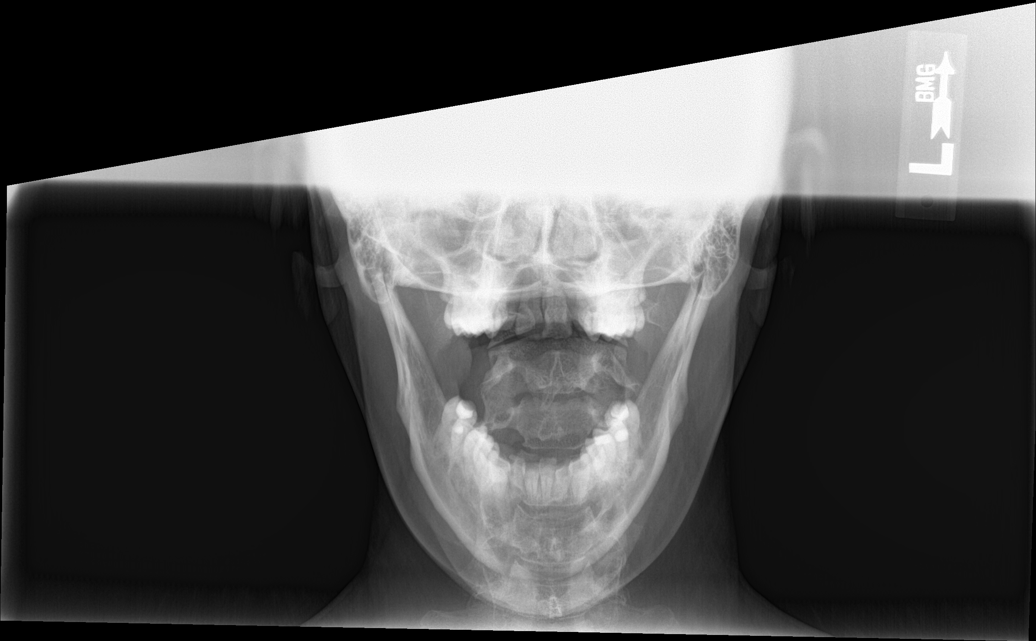

[5 of 5 positions shown; findings below may reference images not displayed]

FINDINGS: Reversal of cervical lordosis question muscle spasm.

Osseous mineralization normal for technique.

Prevertebral soft tissues normal thickness.

Vertebral body and disc space heights maintained.

Bony foramina patent.

No acute fracture, subluxation or bone destruction.

Lung apices appear clear
IMPRESSION: Question muscle spasm ; otherwise negative exam.

## 2016-11-18 MED ORDER — NAPROXEN 500 MG PO TABS: 500.0000 mg | ORAL_TABLET | Freq: Two times a day (BID) | ORAL | 0 refills | Status: DC

## 2016-11-18 MED ORDER — CYCLOBENZAPRINE HCL 10 MG PO TABS: 5.0000 mg | ORAL_TABLET | Freq: Three times a day (TID) | ORAL | 0 refills | Status: DC | PRN

## 2016-11-18 NOTE — Progress Notes (Signed)
11/21/2016 9:22 AM   DOB: 05-[name]-1991 / MRN: [id number]  SUBJECTIVE:  Donna Leach is a 26 y.o. female presenting for bilateral arm pain from the shoulders to the wrists. This started on 3 days ago. She describes the pain as sore muscles and she tells me she has never felt this way before.  She associates tension about the neck and thoracic muscle tightness along with neck pain. Arm abduction makes the pain worse.  Also feels that the arms are "deflated." She did do some hot yoga on Saturday but tells me this is nothing new for her.  She denies any other strenuous/new physical activities, however she does tell me she is very physically active.   Denies any new medications or supplements.   She is allergic to amoxicillin; penicillins; and suprax [cefixime].   She  has a past medical history of Depression and No pertinent past medical history.    She  reports that she has been smoking.  She has smoked for the past 2.00 years. She has never used smokeless tobacco. She reports that she drinks about 0.5 oz of alcohol per week . She reports that she does not use drugs. She  reports that she currently engages in sexual activity. She reports using the following method of birth control/protection: Implant. The patient  has a past surgical history that includes Appendectomy and Adnoids.  Her family history includes Diabetes in her father and mother; Hypertension in her father.  Review of Systems  Constitutional: Negative for chills and fever.  HENT: Negative for sore throat.   Musculoskeletal: Positive for back pain, myalgias and neck pain. Negative for falls and joint pain.  Skin: Negative for rash.  Neurological: Negative for dizziness, focal weakness and headaches.    The problem list and medications were reviewed and updated by myself where necessary and exist elsewhere in the encounter.   OBJECTIVE:   BP 112/68   Pulse 63   Temp 97.7 F (36.5 C) (Oral)   Resp 16   Ht _0  (1.727 m)   Wt  138 lb (62.6 kg)   SpO2 100%   BMI 20.98 kg/m   Physical Exam  Constitutional: She is oriented to person, place, and time. She appears well-nourished. No distress.  Eyes: EOM are normal. Pupils are equal, round, and reactive to light.  Cardiovascular: Normal rate.   Pulmonary/Chest: Effort normal.  Abdominal: She exhibits no distension.  Musculoskeletal:       Cervical back: She exhibits tenderness (upper trapeziuss bilaterally), pain and spasm. She exhibits no bony tenderness, no swelling, no edema and no deformity.       Right upper arm: Normal.       Left upper arm: Normal.       Right forearm: Normal.       Left forearm: Normal.  Neurological: She is alert and oriented to person, place, and time. No cranial nerve deficit. Gait normal.  Skin: Skin is dry. She is not diaphoretic.  Psychiatric: She has a normal mood and affect.  Vitals reviewed.   Results for orders placed or performed in visit on 11/18/16 (from the past 72 hour(s))  CMP14+EGFR     Status: None   Collection Time: 11/18/16  3:57 PM  Result Value Ref Range   Glucose 87 65 - 99 mg/dL   BUN 17 6 - 20 mg/dL   Creatinine, Ser 0.78 0.57 - 1.00 mg/dL   GFR calc non Af Amer 105 >59 mL/min/1.73   GFR calc Af  Amer 120 >59 mL/min/1.73   BUN/Creatinine Ratio 22 9 - 23   Sodium 139 134 - 144 mmol/L   Potassium 4.0 3.5 - 5.2 mmol/L   Chloride 101 96 - 106 mmol/L   CO2 28 18 - 29 mmol/L   Calcium 9.2 8.7 - 10.2 mg/dL   Total Protein 6.9 6.0 - 8.5 g/dL   Albumin 4.4 3.5 - 5.5 g/dL   Globulin, Total 2.5 1.5 - 4.5 g/dL   Albumin/Globulin Ratio 1.8 1.2 - 2.2   Bilirubin Total 0.6 0.0 - 1.2 mg/dL   Alkaline Phosphatase 40 39 - 117 IU/L   AST 15 0 - 40 IU/L   ALT 13 0 - 32 IU/L  CK     Status: None   Collection Time: 11/18/16  3:57 PM  Result Value Ref Range   Total CK 95 24 - 173 U/L  POCT urine pregnancy     Status: None   Collection Time: 11/18/16  4:07 PM  Result Value Ref Range   Preg Test, Ur Negative Negative   POCT urinalysis dipstick     Status: Abnormal   Collection Time: 11/18/16  4:11 PM  Result Value Ref Range   Color, UA yellow yellow   Clarity, UA clear clear   Glucose, UA negative negative   Bilirubin, UA negative negative   Ketones, POC UA negative negative   Spec Grav, UA 1.010 1.003, 1.005, 1.010, 1.015, 1.020, 1.025, 1.030, 1.035   Blood, UA negative negative   pH, UA 7.0 5.0, 5.5, 6.0, 6.5, 7.0, 7.5, 8.0   Protein Ur, POC negative negative   Urobilinogen, UA 0.2 0.2, 1.0   Nitrite, UA Negative Negative   Leukocytes, UA Trace (A) Negative  POCT CBC     Status: Abnormal   Collection Time: 11/18/16  4:11 PM  Result Value Ref Range   WBC 6.6 4.6 - 10.2 K/uL   Lymph, poc 2.7 0.6 - 3.4   POC LYMPH PERCENT 40.7 10 - 50 %L   MID (cbc) 0.4 0 - 0.9   POC MID % 6.1 0 - 12 %M   POC Granulocyte 3.5 2 - 6.9   Granulocyte percent 53.2 37 - 80 %G   RBC 3.92 (A) 4.04 - 5.48 M/uL   Hemoglobin 13.3 12.2 - 16.2 g/dL   HCT, POC 37.6 (A) 37.7 - 47.9 %   MCV 96.0 80 - 97 fL   MCH, POC 34.1 (A) 27 - 31.2 pg   MCHC 35.4 31.8 - 35.4 g/dL   RDW, POC 12.1 %   Platelet Count, POC 204 142 - 424 K/uL   MPV 7.7 0 - 99.8 fL    No results found.  ASSESSMENT AND PLAN: [id number]   Shearon was seen today for arm pain.  Diagnoses and all orders for this visit:  Bilateral arm pain: She has an odd complaint, however her exam is normal, there is no protein in her urine, and she has loss of the cervical lordosis suggesting this is possibly radicular in nature.  Will screen a CK however this is very low likelihood.   -     DG Cervical Spine Complete; Future -     POCT SEDIMENTATION RATE -     POCT urine pregnancy -     POCT urinalysis dipstick -     POCT CBC -     CMP14+EGFR -     CK -     cyclobenzaprine (FLEXERIL) 10 MG tablet; Take 0.5-1 tablets (5-10 mg total)  by mouth 3 (three) times daily as needed for muscle spasms. -     naproxen (NAPROSYN) 500 MG tablet; Take 1 tablet (500 mg total) by  mouth 2 (two) times daily with a meal.    The patient is advised to call or return to clinic if she does not see an improvement in symptoms, or to seek the care of the closest emergency department if she worsens with the above plan.   Philis Fendt, MHS, PA-C [facility] 11/21/2016 9:22 AM

## 2016-11-18 NOTE — Patient Instructions (Signed)
     IF you received an x-ray today, you will receive an invoice from [facility]. Please contact [facility] at [phone] with questions or concerns regarding your invoice.   IF you received labwork today, you will receive an invoice from [facility]. Please contact [facility] at [phone] with questions or concerns regarding your invoice.   Our billing staff will not be able to assist you with questions regarding bills from these companies.  You will be contacted with the lab results as soon as they are available. The fastest way to get your results is to activate your My Chart account. Instructions are located on the last page of this paperwork. If you have not heard from us regarding the results in 2 weeks, please contact this office.     

## 2016-11-18 NOTE — Progress Notes (Signed)
N

## 2016-11-19 LAB — CMP14+EGFR
ALT: 13 IU/L (ref 0–32)
AST: 15 IU/L (ref 0–40)
Albumin/Globulin Ratio: 1.8 (ref 1.2–2.2)
Albumin: 4.4 g/dL (ref 3.5–5.5)
Alkaline Phosphatase: 40 IU/L (ref 39–117)
BUN/Creatinine Ratio: 22 (ref 9–23)
BUN: 17 mg/dL (ref 6–20)
Bilirubin Total: 0.6 mg/dL (ref 0.0–1.2)
CALCIUM: 9.2 mg/dL (ref 8.7–10.2)
CO2: 28 mmol/L (ref 18–29)
CREATININE: 0.78 mg/dL (ref 0.57–1.00)
Chloride: 101 mmol/L (ref 96–106)
GFR, EST AFRICAN AMERICAN: 120 mL/min/{1.73_m2} (ref >59)
GFR, EST NON AFRICAN AMERICAN: 105 mL/min/{1.73_m2} (ref >59)
Globulin, Total: 2.5 g/dL (ref 1.5–4.5)
Glucose: 87 mg/dL (ref 65–99)
Potassium: 4 mmol/L (ref 3.5–5.2)
Sodium: 139 mmol/L (ref 134–144)
Total Protein: 6.9 g/dL (ref 6.0–8.5)

## 2016-11-19 LAB — CK: Total CK: 95 U/L (ref 24–173)

## 2017-03-13 ENCOUNTER — Emergency Department (HOSPITAL_COMMUNITY)
Admission: EM | Admit: 2017-03-13 | Discharge: 2017-03-13 | Disposition: A | Discharge status: Home / Self Care | Payer: BLUE CROSS/BLUE SHIELD | Attending: Emergency Medicine | Admitting: Emergency Medicine

## 2017-03-13 ENCOUNTER — Encounter (HOSPITAL_COMMUNITY): Payer: Self-pay | Admitting: Emergency Medicine

## 2017-03-13 DIAGNOSIS — H722X2 Other marginal perforations of tympanic membrane, left ear: Secondary | ICD-10-CM | POA: Insufficient documentation

## 2017-03-13 DIAGNOSIS — F17210 Nicotine dependence, cigarettes, uncomplicated: Secondary | ICD-10-CM | POA: Insufficient documentation

## 2017-03-13 DIAGNOSIS — Z79899 Other long term (current) drug therapy: Secondary | ICD-10-CM | POA: Insufficient documentation

## 2017-03-13 DIAGNOSIS — H7292 Unspecified perforation of tympanic membrane, left ear: Secondary | ICD-10-CM

## 2017-03-13 DIAGNOSIS — Z203 Contact with and (suspected) exposure to rabies: Secondary | ICD-10-CM | POA: Insufficient documentation

## 2017-03-13 DIAGNOSIS — Z2914 Encounter for prophylactic rabies immune globin: Secondary | ICD-10-CM | POA: Insufficient documentation

## 2017-03-13 DIAGNOSIS — Z23 Encounter for immunization: Secondary | ICD-10-CM | POA: Insufficient documentation

## 2017-03-13 DIAGNOSIS — W540XXA Bitten by dog, initial encounter: Secondary | ICD-10-CM | POA: Insufficient documentation

## 2017-03-13 MED ORDER — TETANUS-DIPHTH-ACELL PERTUSSIS 5-2.5-18.5 LF-MCG/0.5 IM SUSP
0.5000 mL | Freq: Once | INTRAMUSCULAR | Status: AC
  Administered 2017-03-13: 0.5 mL via INTRAMUSCULAR
  Filled 2017-03-13: qty 0.5

## 2017-03-13 MED ORDER — RABIES VACCINE, PCEC IM SUSR
1.0000 mL | Freq: Once | INTRAMUSCULAR | Status: AC
  Administered 2017-03-13: 1 mL via INTRAMUSCULAR
  Filled 2017-03-13: qty 1

## 2017-03-13 MED ORDER — RABIES IMMUNE GLOBULIN 150 UNIT/ML IM INJ
20.0000 [IU]/kg | INJECTION | Freq: Once | INTRAMUSCULAR | Status: AC
  Administered 2017-03-13: 1200 [IU] via INTRAMUSCULAR
  Filled 2017-03-13: qty 8

## 2017-03-13 MED ORDER — OFLOXACIN 0.3 % OT SOLN: 10.0000 [drp] | Freq: Every day | OTIC | 0 refills | Status: AC

## 2017-03-13 MED ORDER — AZITHROMYCIN 250 MG PO TABS: 250.0000 mg | ORAL_TABLET | Freq: Every day | ORAL | 0 refills | Status: DC

## 2017-03-13 NOTE — ED Provider Notes (Signed)
[facility] Provider Note   CSN: [id number] Arrival date & time: 03/13/17  1929  By signing my name below, I, Cynda Acres, attest that this documentation has been prepared under the direction and in the presence of [employer]. Electronically Signed: Cynda Acres, Scribe. 03/13/17. 9:13 PM.  History   Chief Complaint Chief Complaint  Patient presents with  . Otalgia   HPI Comments: Donna Leach is a 26 y.o. female with no pertinent past medical history, who presents to the Emergency Department complaining of sudden-onset, constant left ear decreased hearing that began several days ago. Patient reports initially developing left ear pain after swimming several days ago, which has now resolved. Patient reports having similar symptoms in the past (4-5 times, last occurrence was two years ago). Patient reports associated drainage, which has also resolved. Patient reports applying over the counter ear drops, which she discontinued after developing more severe pain. She denies pain at this time. No medications taken. Patient is allergic to penicillins. Patient denies any fever, chills, cough or any additional symptoms.   Patient is also complaining of a dog bite to the lip, which occurred earlier today. Patient reports attempting to keep two fighting dogs out of the road when the dog bit her on the lip. It is unknown if the dog is up to date on immunizations. Last tetanus shot was 10/11/08. Patient denies any pain or bleeding from site of the injury.   The history is provided by the patient. No language interpreter was used.    Past Medical History:  Diagnosis Date  . Depression   . No pertinent past medical history     Patient Active Problem List   Diagnosis Date Noted  . Traumatic hematoma of finger 11/06/2016  . Depression 04/08/2016    Past Surgical History:  Procedure Laterality Date  . Adnoids    . APPENDECTOMY      OB History    Gravida Para Term Preterm AB  Living   0             SAB TAB Ectopic Multiple Live Births                   Home Medications    Prior to Admission medications   Medication Sig Start Date End Date Taking? Authorizing Provider  azithromycin (ZITHROMAX) 250 MG tablet Take 1 tablet (250 mg total) by mouth daily. Take first 2 tablets together, then 1 every day until finished. 03/13/17   Vidhi Delellis, Waylan Boga, PA-C  cyclobenzaprine (FLEXERIL) 10 MG tablet Take 0.5-1 tablets (5-10 mg total) by mouth 3 (three) times daily as needed for muscle spasms. 11/18/16   Ofilia Neas, PA-C  etonogestrel (NEXPLANON) 68 MG IMPL implant Inject 1 each into the skin once. 11/11/11   [provider]  naproxen (NAPROSYN) 500 MG tablet Take 1 tablet (500 mg total) by mouth 2 (two) times daily with a meal. 11/18/16   Ofilia Neas, PA-C  ofloxacin (FLOXIN) 0.3 % OTIC solution Place 10 drops into the left ear daily. 03/13/17 03/23/17  Emi Holes, PA-C  sertraline (ZOLOFT) 25 MG tablet Take 25 mg by mouth daily.    [provider]  traZODone (DESYREL) 50 MG tablet TAKE 1 TO 3 TABLETS AT BEDTIME 02/18/16   [provider]    Family History Family History  Problem Relation Age of Onset  . Hypertension Father   . Diabetes Father   . Diabetes Mother     Social  History Social History  Substance Use Topics  . Smoking status: Current Some Day Smoker    Years: 2.00  . Smokeless tobacco: Never Used     Comment: 3 cigs per day  . Alcohol use 0.5 oz/week    1 Standard drinks or equivalent per week     Allergies   Amoxicillin; Penicillins; and Suprax [cefixime]   Review of Systems Review of Systems  Constitutional: Negative for chills and fever.  HENT: Positive for ear discharge (left) and ear pain (left).   Respiratory: Negative for cough.   Neurological: Negative for weakness and numbness.     Physical Exam Updated Vital Signs BP 107/69 (BP Location: Right Arm)   Pulse 84   Temp 98.5 F (36.9 C)  (Oral)   Resp 16   Ht 5\' 8"  (1.727 m)   Wt 61.2 kg (135 lb)   SpO2 100%   BMI 20.53 kg/m   Physical Exam  Constitutional: She appears well-developed and well-nourished. No distress.  HENT:  Head: Normocephalic and atraumatic.  Mouth/Throat: Oropharynx is clear and moist. No oropharyngeal exudate.  Tympanic perforation in the left TM. No findings on the lip.   Eyes: Conjunctivae are normal. Pupils are equal, round, and reactive to light. Right eye exhibits no discharge. Left eye exhibits no discharge. No scleral icterus.  Neck: Normal range of motion. Neck supple. No thyromegaly present.  Cardiovascular: Normal rate, regular rhythm, normal heart sounds and intact distal pulses.  Exam reveals no gallop and no friction rub.   No murmur heard. Pulmonary/Chest: Effort normal and breath sounds normal. No stridor. No respiratory distress. She has no wheezes. She has no rales.  Musculoskeletal: She exhibits no edema.  Lymphadenopathy:    She has no cervical adenopathy.  Neurological: She is alert. Coordination normal.  Skin: Skin is warm and dry. No rash noted. She is not diaphoretic. No pallor.  Psychiatric: She has a normal mood and affect.  Nursing note and vitals reviewed.    ED Treatments / Results  DIAGNOSTIC STUDIES: Oxygen Saturation is 100% on RA, normal by my interpretation.    COORDINATION OF CARE: 9:11 PM Discussed treatment plan with pt at bedside and pt agreed to plan, which includes a rabies injection and antibiotics.    Labs (all labs ordered are listed, but only abnormal results are displayed) Labs Reviewed - No data to display  EKG  EKG Interpretation None       Radiology No results found.  Procedures Procedures (including critical care time)  Medications Ordered in ED Medications  Tdap (BOOSTRIX) injection 0.5 mL (0.5 mLs Intramuscular Given 03/13/17 2200)  rabies vaccine (RABAVERT) injection 1 mL (1 mL Intramuscular Given 03/13/17 2158)  rabies immune  globulin (HYPERAB/KEDRAB) injection 1,200 Units (1,200 Units Intramuscular Given 03/13/17 2159)     Initial Impression / Assessment and Plan / ED Course  I have reviewed the triage vital signs and the nursing notes.  Pertinent labs & imaging results that were available during my care of the patient were reviewed by me and considered in my medical decision making (see chart for details).     Patient with tympanic perforation. No signs of acute otitis media, however we'll cover with azithromycin and ofloxacin drops. Patient has amoxicillin allergy. Considering patient's dog bite, an azithromycin will cover prophylaxis as well. I do not see any break in the skin, however patient reportedly had a puncture wound. She is concerned about rabies exposure as she does not know the dog. Tetanus updated  in the ED. Rabies series initiated. Patient advised to come back for her series. Strict return precautions given. Patient given follow-up to ENT. She understands and agrees with plan. Patient vitals stable throughout ED course and discharged in satisfactory condition.  Final Clinical Impressions(s) / ED Diagnoses   Final diagnoses:  Perforation of left tympanic membrane  Dog bite, initial encounter  Need for post exposure prophylaxis for rabies    New Prescriptions Discharge Medication List as of 03/13/2017 10:49 PM    START taking these medications   Details  azithromycin (ZITHROMAX) 250 MG tablet Take 1 tablet (250 mg total) by mouth daily. Take first 2 tablets together, then 1 every day until finished., Starting Fri 03/13/2017, Print    ofloxacin (FLOXIN) 0.3 % OTIC solution Place 10 drops into the left ear daily., Starting Fri 03/13/2017, Until Mon 03/23/2017, Print       I personally performed the services described in this documentation, which was scribed in my presence. The recorded information has been reviewed and is accurate.     Emi HolesLaw, Kairon Shock M, PA-C 03/14/17 Mariann [id number]    Arby BarrettePfeiffer, Marcy,  MD 03/14/17 [id number]

## 2017-03-13 NOTE — ED Triage Notes (Signed)
Pt reports L ear ache, states she think she ruptured her ear drum. Pt reports some hearing difficulty and drainage.

## 2017-03-13 NOTE — Discharge Instructions (Signed)
Medications: Azithromycin, ofloxacin  Treatment: Take azithromycin as prescribed for 5 days. Use ofloxacin drops as prescribed for 7 days. Try to avoid getting any other liquid in your ear.  Follow-up: Please follow-up with the ear nose and throat doctor, Dr. Jearld FentonByers, for follow-up and further evaluation and treatment of your eardrum perforation. Please return to the emergency department if he develop any new or worsening symptoms. Otherwise, please return (or go to the urgent care across the parking lot) according to your rabies vaccination schedule.

## 2017-03-16 ENCOUNTER — Emergency Department (HOSPITAL_COMMUNITY)
Admission: EM | Admit: 2017-03-16 | Discharge: 2017-03-16 | Disposition: A | Discharge status: Home / Self Care | Payer: BLUE CROSS/BLUE SHIELD | Attending: Emergency Medicine | Admitting: Emergency Medicine

## 2017-03-16 ENCOUNTER — Encounter (HOSPITAL_COMMUNITY): Payer: Self-pay

## 2017-03-16 DIAGNOSIS — Z23 Encounter for immunization: Secondary | ICD-10-CM | POA: Insufficient documentation

## 2017-03-16 DIAGNOSIS — Z203 Contact with and (suspected) exposure to rabies: Secondary | ICD-10-CM | POA: Insufficient documentation

## 2017-03-16 MED ORDER — RABIES VACCINE, PCEC IM SUSR
1.0000 mL | Freq: Once | INTRAMUSCULAR | Status: AC
  Administered 2017-03-16: 1 mL via INTRAMUSCULAR
  Filled 2017-03-16: qty 1

## 2017-03-16 NOTE — ED Triage Notes (Addendum)
Pt here for 2nd rabies shot due to a dog bite last Friday. Pt has no complaints. VSS.

## 2017-03-16 NOTE — Discharge Instructions (Signed)
Please follow[facility] at [facility], or at the health department for your further vaccines                                              Your Vaccine Injections will be given at the Urgent [facility]([address]) at [facility].  [facility] is open from 8:00AM-9:00PM Monday thru Friday and 10:00AM-9:00PM on Saturdays and Sundays.  Please call [phone] Urgent Care Center, if you have any problems with making your scheduled appointments.  This will assure you prompt attention on your arrival and allow us to be prepared for your return visit.  There will be a minimal fee for the injection that will be billed to your insurance company along with the charge for the vaccine. F

## 2017-03-21 ENCOUNTER — Encounter (HOSPITAL_COMMUNITY): Payer: Self-pay | Admitting: *Deleted

## 2017-03-21 ENCOUNTER — Ambulatory Visit (HOSPITAL_COMMUNITY)
Admission: EM | Admit: 2017-03-21 | Discharge: 2017-03-21 | Disposition: A | Discharge status: Home / Self Care | Payer: BLUE CROSS/BLUE SHIELD | Attending: Emergency Medicine | Admitting: Emergency Medicine

## 2017-03-21 DIAGNOSIS — Z23 Encounter for immunization: Secondary | ICD-10-CM

## 2017-03-21 DIAGNOSIS — Z203 Contact with and (suspected) exposure to rabies: Secondary | ICD-10-CM

## 2017-03-21 MED ORDER — RABIES VACCINE, PCEC IM SUSR
1.0000 mL | Freq: Once | INTRAMUSCULAR | Status: AC
  Administered 2017-03-21: 1 mL via INTRAMUSCULAR

## 2017-03-21 MED ORDER — RABIES VACCINE, PCEC IM SUSR
INTRAMUSCULAR | Status: AC
  Filled 2017-03-21: qty 1

## 2017-03-21 NOTE — ED Triage Notes (Signed)
Presents for rabies vaccination.  Was due yesterday, but had family emergency.  Verified with pt that she may still stay on schedule to have next dose 7/20 following today.

## 2017-03-21 NOTE — Discharge Instructions (Signed)
Return on 7/20 for last injection.

## 2017-03-27 ENCOUNTER — Encounter (HOSPITAL_COMMUNITY): Payer: Self-pay | Admitting: Emergency Medicine

## 2017-03-27 ENCOUNTER — Ambulatory Visit (HOSPITAL_COMMUNITY)
Admission: EM | Admit: 2017-03-27 | Discharge: 2017-03-27 | Disposition: A | Discharge status: Home / Self Care | Payer: BLUE CROSS/BLUE SHIELD | Attending: Internal Medicine | Admitting: Internal Medicine

## 2017-03-27 DIAGNOSIS — Z23 Encounter for immunization: Secondary | ICD-10-CM

## 2017-03-27 DIAGNOSIS — Z203 Contact with and (suspected) exposure to rabies: Secondary | ICD-10-CM

## 2017-03-27 MED ORDER — RABIES VACCINE, PCEC IM SUSR
1.0000 mL | Freq: Once | INTRAMUSCULAR | Status: AC
  Administered 2017-03-27: 1 mL via INTRAMUSCULAR

## 2017-03-27 MED ORDER — RABIES VACCINE, PCEC IM SUSR
INTRAMUSCULAR | Status: AC
  Filled 2017-03-27: qty 1

## 2017-03-27 NOTE — ED Triage Notes (Signed)
PT reports contact with a dog. PT is here for last rabies vaccine.

## 2018-01-22 IMAGING — US US THYROID
1 series · 13 of 25 positions shown · non-contrast
Comparison: None.

CLINICAL DATA: Hyperthyroid.

EXAM:
THYROID ULTRASOUND
TECHNIQUE: Ultrasound examination of the thyroid gland and adjacent soft
tissues was performed.

[Series 1: us thyroid · 0.06mm/px · 13 of 37 slices shown]
[im 1/37]
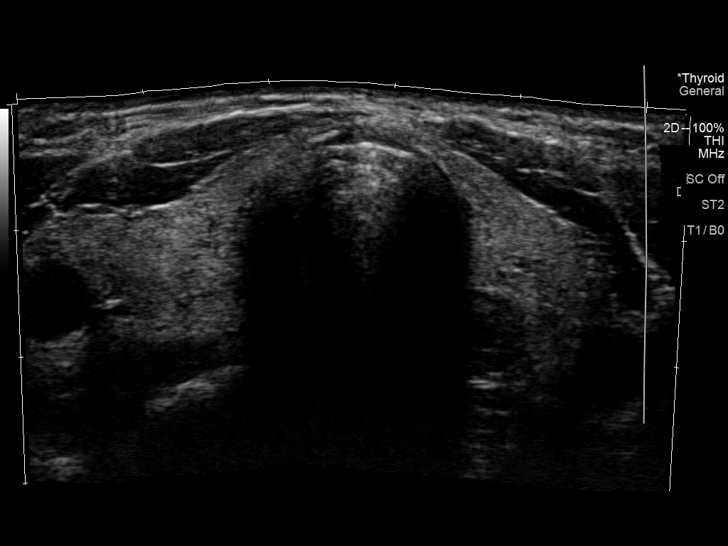
[im 4/37]
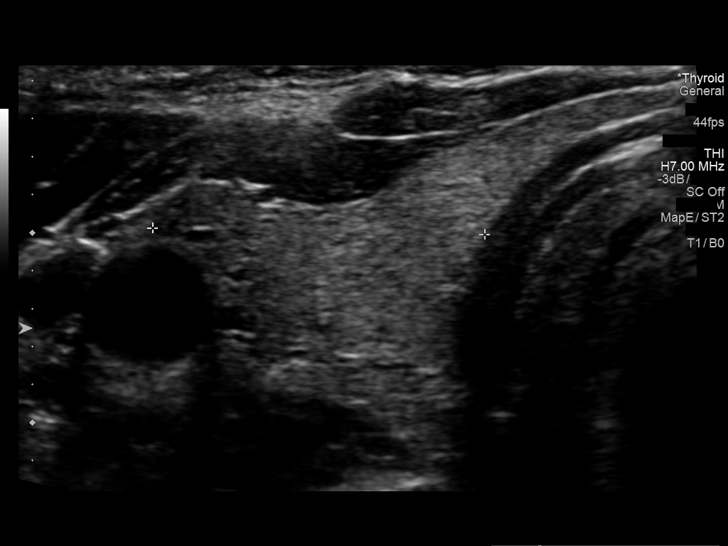
[im 7/37]
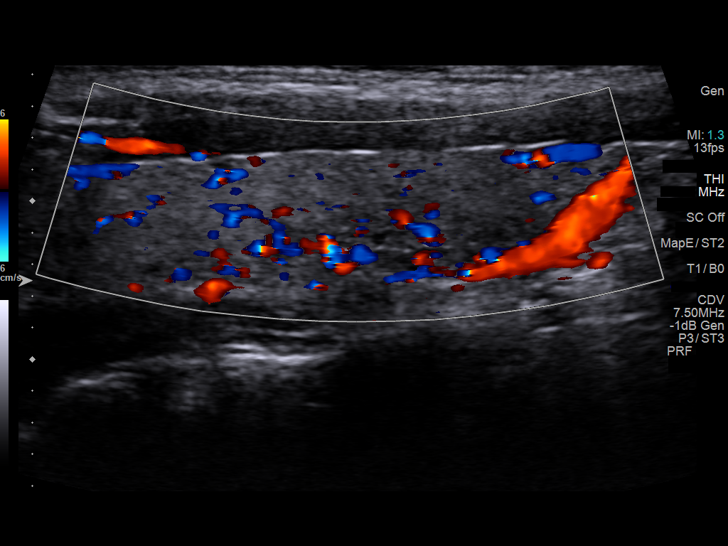
[im 10/37]
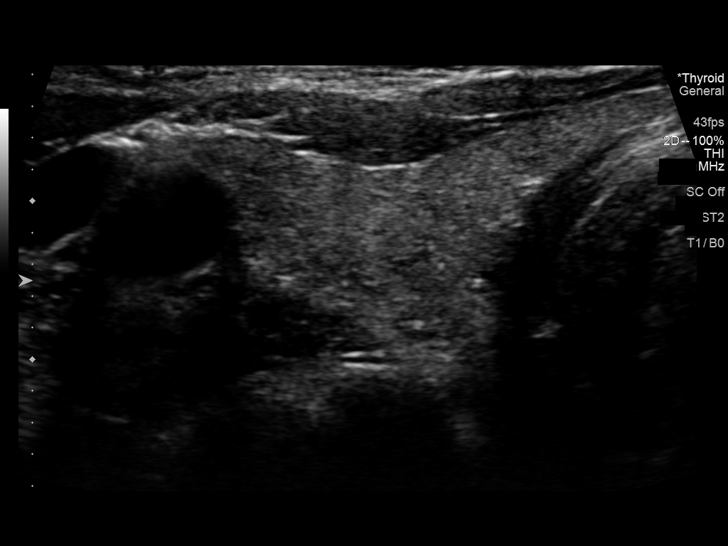
[im 13/37]
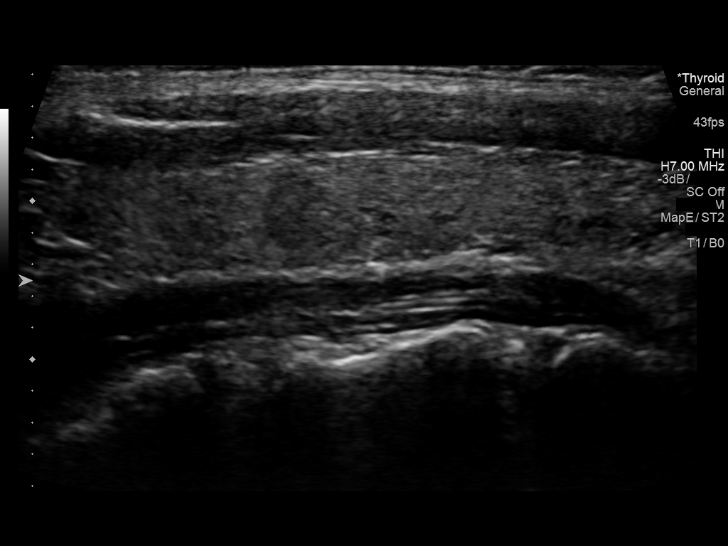
[im 16/37]
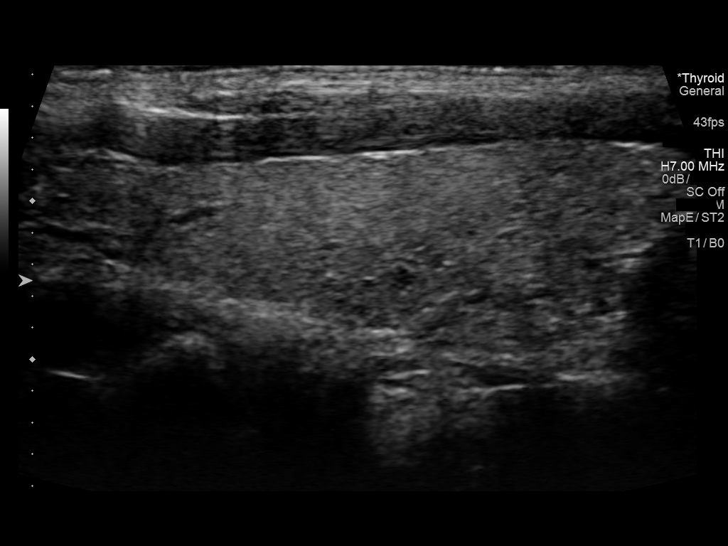
[im 19/37]
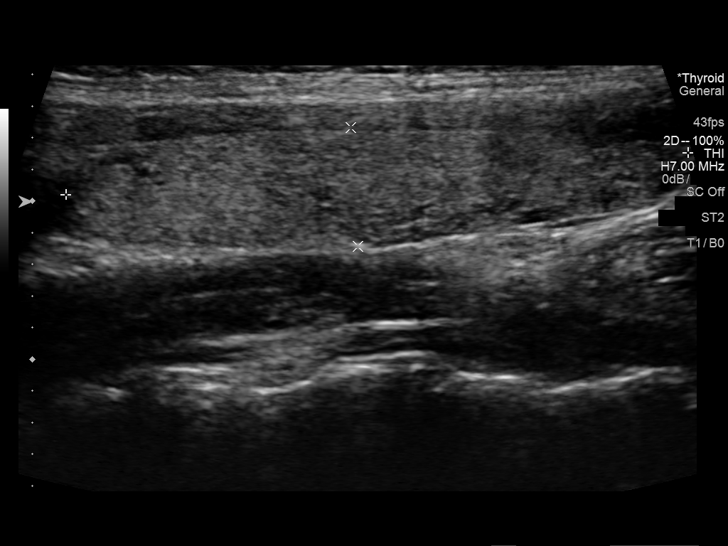
[im 22/37]
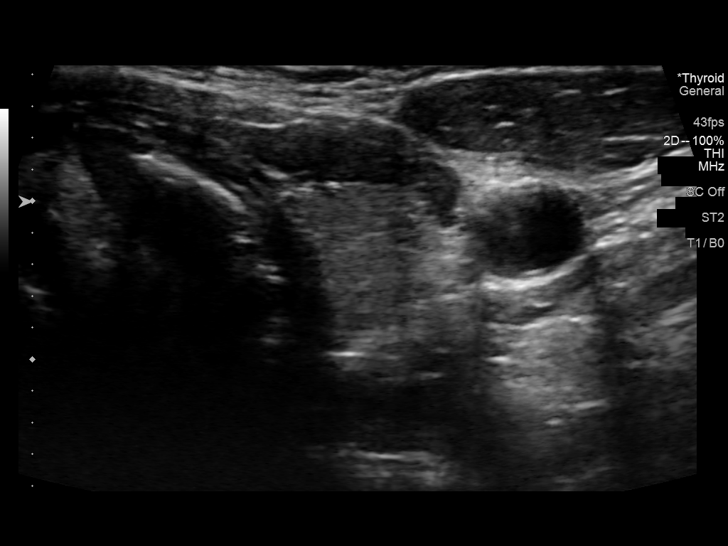
[im 25/37]
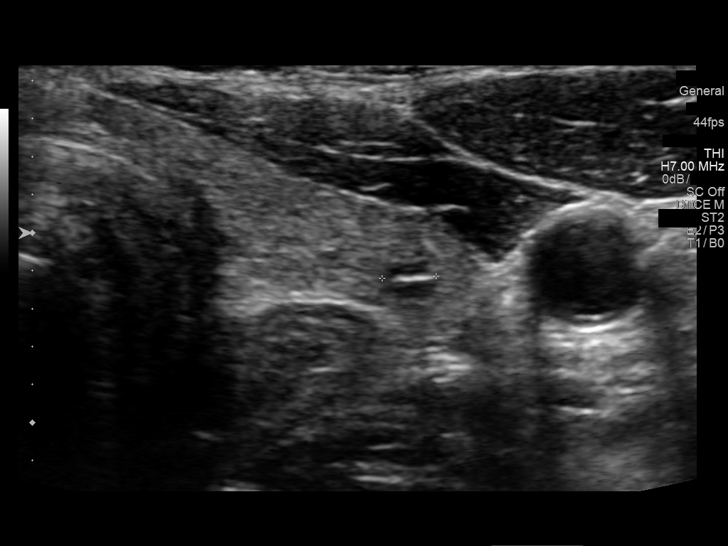
[im 28/37]
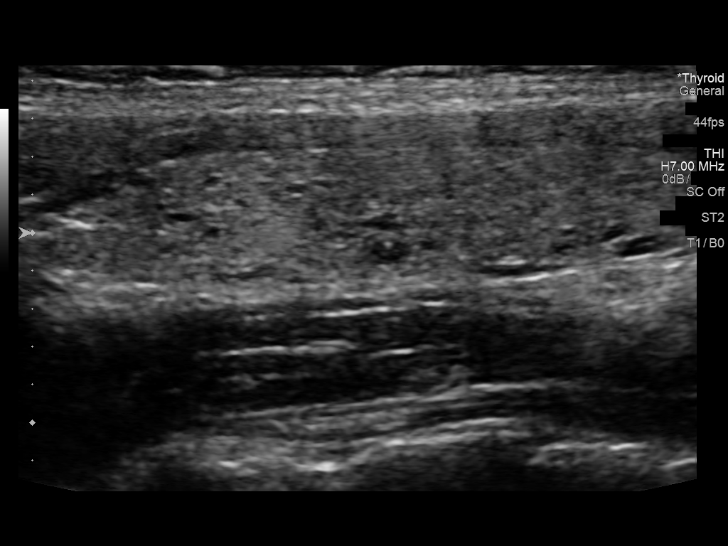
[im 31/37]
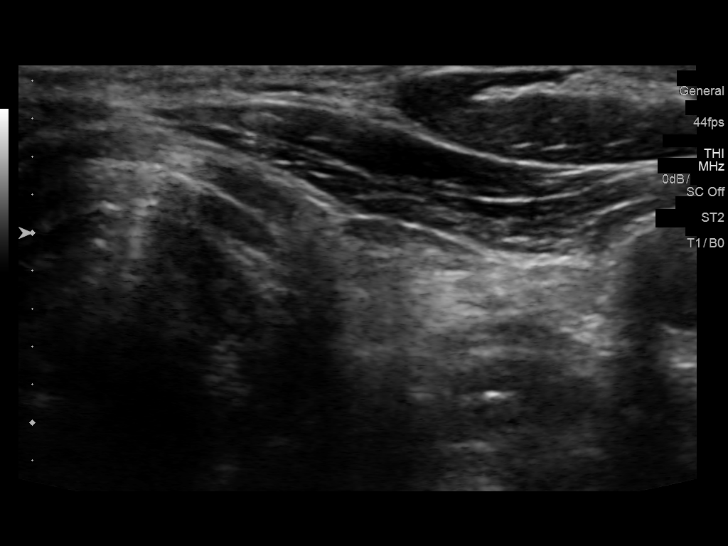
[im 34/37]
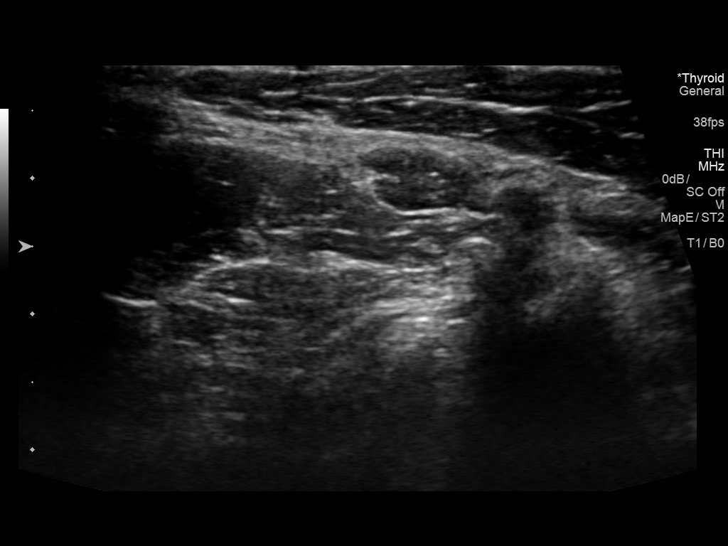
[im 37/37]
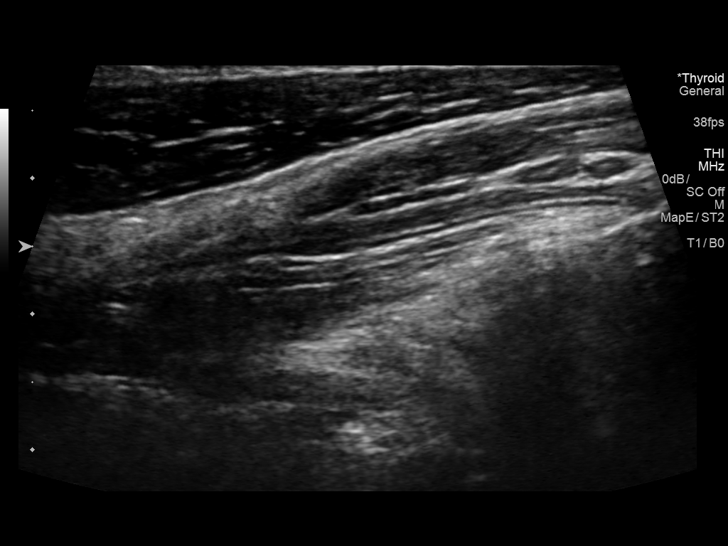

[13 of 25 positions shown; findings below may reference images not displayed]

FINDINGS: Parenchymal Echotexture: Mildly heterogenous - potential mild
glandular hyperemia (images 9

Isthmus: Normal in size measures 0.2 cm in diameter

Right lobe: Slightly atrophic measuring 4.3 x 1.3 x 1.8 cm

Left lobe: There is slightly atrophic measuring 4.0 x 0.8 x 1.6 cm

_________________________________________________________

Estimated total number of nodules >/= 1 cm: 0

Number of spongiform nodules >/=  2 cm not described below (TR1): 0

Number of mixed cystic and solid nodules >/= 1.5 cm not described
below (TR2): 0

_________________________________________________________

Note is made of a punctate (approximately 0.3 cm) cyst within the
mid/inferior pole of the left lobe of the thyroid which contains an
internal echogenic foci with ring down artifact compatible with
benign colloid. This benign colloid containing cyst does not meet
imaging criteria to recommend percutaneous sampling or continued
dedicated follow-up.
IMPRESSION: Slightly atrophic, mildly heterogeneous and potentially hyperemic
thyroid gland without discrete worrisome nodule or mass. Findings
are nonspecific though could be seen in the setting of a
thyroiditis. Clinical correlation is advised.

## 2018-02-24 LAB — HM PAP SMEAR

## 2018-03-24 ENCOUNTER — Encounter: Payer: Self-pay | Admitting: *Deleted

## 2018-04-21 ENCOUNTER — Ambulatory Visit (HOSPITAL_COMMUNITY)
Admission: EM | Admit: 2018-04-21 | Discharge: 2018-04-21 | Disposition: A | Discharge status: Home / Self Care | Payer: Self-pay | Attending: Family Medicine | Admitting: Family Medicine

## 2018-04-21 ENCOUNTER — Encounter (HOSPITAL_COMMUNITY): Payer: Self-pay | Admitting: Family Medicine

## 2018-04-21 DIAGNOSIS — R229 Localized swelling, mass and lump, unspecified: Secondary | ICD-10-CM

## 2018-04-21 NOTE — ED Triage Notes (Signed)
Pt has a knot on her nose and its been there a long time. Its been there for years pt is seeing changes in her nose size.

## 2018-04-26 NOTE — ED Provider Notes (Signed)
[facility]   [id number] 04/21/18 Arrival Time: 1705  ASSESSMENT & PLAN:  1. Subcutaneous mass    Recommend ENT evaluation. May f/u here as needed. Reassured that I do not feel this is anything dangerous. Reviewed expectations re: course of current medical issues. Questions answered. Outlined signs and symptoms indicating need for more acute intervention. Patient verbalized understanding. After Visit Summary given.   SUBJECTIVE:  Donna Leach is a 27 y.o. female who reports "a knot" on her nose. Present for many years but recently she feels it has been enlarging very slowly. No injury. Non-painful. Otherwise well. Has not seen a doctor for this in the past. No specific aggravating or alleviating factors reported. No home treatment. No overlying skin changes.  ROS: As per HPI.  OBJECTIVE: Vitals:   04/21/18 1743 04/21/18 1744  BP: (!) 113/96   Pulse: 71   Resp: 16   Temp: (!) 97.5 F (36.4 C)   SpO2: 100%   Weight:  61.2 kg    General appearance: alert; no distress HEENT: very slight hard area on left tip of nose; 2-33mm at most; non-tender; no overlying skin changes Lungs: clear to auscultation bilaterally Heart: regular rate and rhythm Extremities: no edema Skin: warm and dry Psychological: alert and cooperative; normal mood and affect  Allergies  Allergen Reactions  . Amoxicillin Hives  . Penicillins Hives  . Suprax [Cefixime]     Past Medical History:  Diagnosis Date  . Depression   . No pertinent past medical history    Social History   Socioeconomic History  . Marital status: Single    Spouse name: Not on file  . Number of children: Not on file  . Years of education: Not on file  . Highest education level: Not on file  Occupational History  . Not on file  Social Needs  . Financial resource strain: Not on file  . Food insecurity:    Worry: Not on file    Inability: Not on file  . Transportation needs:    Medical: Not on file   Non-medical: Not on file  Tobacco Use  . Smoking status: Former Smoker    Years: 2.00    Last attempt to quit: 11/14/2016    Years since quitting: 1.4  . Smokeless tobacco: Never Used  . Tobacco comment: 3 cigs per day  Substance and Sexual Activity  . Alcohol use: Yes    Alcohol/week: 1.0 standard drinks    Types: 1 Standard drinks or equivalent per week    Comment: occ  . Drug use: No  . Sexual activity: Yes    Birth control/protection: Implant  Lifestyle  . Physical activity:    Days per week: Not on file    Minutes per session: Not on file  . Stress: Not on file  Relationships  . Social connections:    Talks on phone: Not on file    Gets together: Not on file    Attends religious service: Not on file    Active member of club or organization: Not on file    Attends meetings of clubs or organizations: Not on file    Relationship status: Not on file  . Intimate partner violence:    Fear of current or ex partner: Not on file    Emotionally abused: Not on file    Physically abused: Not on file    Forced sexual activity: Not on file  Other Topics Concern  . Not on file  Social History  Narrative  . Not on file   Family History  Problem Relation Age of Onset  . Hypertension Father   . Diabetes Father   . Diabetes Mother    Past Surgical History:  Procedure Laterality Date  . Adnoids    . Christy GentlesAPPENDECTOMY       Trenyce Loera, MD 04/26/18 [phone]

## 2018-07-23 ENCOUNTER — Encounter (HOSPITAL_COMMUNITY): Payer: Self-pay | Admitting: Emergency Medicine

## 2018-07-23 ENCOUNTER — Ambulatory Visit (HOSPITAL_COMMUNITY)
Admission: EM | Admit: 2018-07-23 | Discharge: 2018-07-23 | Disposition: A | Discharge status: Home / Self Care | Payer: Self-pay | Attending: Physician Assistant | Admitting: Physician Assistant

## 2018-07-23 ENCOUNTER — Other Ambulatory Visit: Payer: Self-pay

## 2018-07-23 DIAGNOSIS — R195 Other fecal abnormalities: Secondary | ICD-10-CM

## 2018-07-23 DIAGNOSIS — R1084 Generalized abdominal pain: Secondary | ICD-10-CM

## 2018-07-23 DIAGNOSIS — Z833 Family history of diabetes mellitus: Secondary | ICD-10-CM | POA: Insufficient documentation

## 2018-07-23 DIAGNOSIS — Z87891 Personal history of nicotine dependence: Secondary | ICD-10-CM | POA: Insufficient documentation

## 2018-07-23 DIAGNOSIS — R2231 Localized swelling, mass and lump, right upper limb: Secondary | ICD-10-CM

## 2018-07-23 DIAGNOSIS — Z3202 Encounter for pregnancy test, result negative: Secondary | ICD-10-CM

## 2018-07-23 DIAGNOSIS — Z8249 Family history of ischemic heart disease and other diseases of the circulatory system: Secondary | ICD-10-CM | POA: Insufficient documentation

## 2018-07-23 DIAGNOSIS — Z881 Allergy status to other antibiotic agents status: Secondary | ICD-10-CM | POA: Insufficient documentation

## 2018-07-23 DIAGNOSIS — R634 Abnormal weight loss: Secondary | ICD-10-CM

## 2018-07-23 DIAGNOSIS — Z880 Allergy status to penicillin: Secondary | ICD-10-CM | POA: Insufficient documentation

## 2018-07-23 LAB — CBC WITH DIFFERENTIAL/PLATELET
ABS IMMATURE GRANULOCYTES: 0.01 10*3/uL (ref 0.00–0.07)
BASOS ABS: 0 10*3/uL (ref 0.0–0.1)
BASOS PCT: 1 %
EOS PCT: 1 %
Eosinophils Absolute: 0.1 10*3/uL (ref 0.0–0.5)
HCT: 38.2 % (ref 36.0–46.0)
Hemoglobin: 13 g/dL (ref 12.0–15.0)
Immature Granulocytes: 0 %
LYMPHS PCT: 31 %
Lymphs Abs: 1.9 10*3/uL (ref 0.7–4.0)
MCH: 33.2 pg (ref 26.0–34.0)
MCHC: 34 g/dL (ref 30.0–36.0)
MCV: 97.7 fL (ref 80.0–100.0)
MONO ABS: 0.5 10*3/uL (ref 0.1–1.0)
Monocytes Relative: 7 %
NEUTROS ABS: 3.6 10*3/uL (ref 1.7–7.7)
NRBC: 0 % (ref 0.0–0.2)
Neutrophils Relative %: 60 %
PLATELETS: 258 10*3/uL (ref 150–400)
RBC: 3.91 MIL/uL (ref 3.87–5.11)
RDW: 11.5 % (ref 11.5–15.5)
WBC: 6.1 10*3/uL (ref 4.0–10.5)

## 2018-07-23 LAB — POCT URINALYSIS DIP (DEVICE)
Bilirubin Urine: NEGATIVE
GLUCOSE, UA: NEGATIVE mg/dL
KETONES UR: NEGATIVE mg/dL
LEUKOCYTES UA: NEGATIVE
Nitrite: NEGATIVE
PROTEIN: NEGATIVE mg/dL
SPECIFIC GRAVITY, URINE: 1.02 (ref 1.005–1.030)
Urobilinogen, UA: 1 mg/dL (ref 0.0–1.0)
pH: 5.5 (ref 5.0–8.0)

## 2018-07-23 LAB — POCT I-STAT, CHEM 8
BUN: 18 mg/dL (ref 6–20)
CHLORIDE: 103 mmol/L (ref 98–111)
Calcium, Ion: 1.18 mmol/L (ref 1.15–1.40)
Creatinine, Ser: 0.9 mg/dL (ref 0.44–1.00)
Glucose, Bld: 88 mg/dL (ref 70–99)
HEMATOCRIT: 37 % (ref 36.0–46.0)
HEMOGLOBIN: 12.6 g/dL (ref 12.0–15.0)
POTASSIUM: 4 mmol/L (ref 3.5–5.1)
SODIUM: 139 mmol/L (ref 135–145)
TCO2: 28 mmol/L (ref 22–32)

## 2018-07-23 LAB — POCT PREGNANCY, URINE: PREG TEST UR: NEGATIVE

## 2018-07-23 NOTE — ED Triage Notes (Addendum)
Pt reports three days of abdominal pain, with increased pain after eating.  She states the pain is primarily on the left but she is having generalized abdominal pain as well.  Pt also reports a 10 lb unintentional weight loss over the last month.  She states that she has been eating more in the last week because of the weight loss, but is not gaining weight.  She states her BM today was not like her normal.  She reports it being darker.  Pt also reports a lump under her right arm that developed one day ago.  Pt reports being in [place] last month with her sister and they are both having digestive issues.

## 2018-07-23 NOTE — ED Provider Notes (Signed)
[facility]    CSN: [id number] Arrival date & time: 07/23/18  1722     History   Chief Complaint Chief Complaint  Patient presents with  . Abdominal Pain  . Weight Loss    HPI Donna Leach is a 27 y.o. female.   The history is provided by the patient. No language interpreter was used.  Abdominal Pain  Pain location:  Generalized Pain quality: aching   Pain radiates to:  Does not radiate Pain severity:  No pain Timing:  Constant Progression:  Worsening Chronicity:  New Relieved by:  Nothing Worsened by:  Nothing Ineffective treatments:  None tried Associated symptoms: anorexia   Risk factors: multiple surgeries   Risk factors: no alcohol abuse   Pt reports she has had abdominal discomfort for the past 3 days.  Pt reports she has had 10 pounds of weight loss in the past month.  Pt reports she has increased her food intake and decreased her exercise.   Past Medical History:  Diagnosis Date  . Depression   . No pertinent past medical history     Patient Active Problem List   Diagnosis Date Noted  . Traumatic hematoma of finger 11/06/2016  . Depression 04/08/2016    Past Surgical History:  Procedure Laterality Date  . Adnoids    . APPENDECTOMY      OB History    Gravida  0   Para      Term      Preterm      AB      Living        SAB      TAB      Ectopic      Multiple      Live Births               Home Medications    Prior to Admission medications   Medication Sig Start Date End Date Taking? Authorizing Provider  etonogestrel (NEXPLANON) 68 MG IMPL implant Inject 1 each into the skin once. 11/11/11  Yes [provider]  naproxen (NAPROSYN) 500 MG tablet Take 1 tablet (500 mg total) by mouth 2 (two) times daily with a meal. 11/18/16   Ofilia Neas, PA-C    Family History Family History  Problem Relation Age of Onset  . Hypertension Father   . Diabetes Father   . Diabetes Mother     Social  History Social History   Tobacco Use  . Smoking status: Former Smoker    Years: 2.00    Last attempt to quit: 11/14/2016    Years since quitting: 1.6  . Smokeless tobacco: Never Used  . Tobacco comment: 3 cigs per day  Substance Use Topics  . Alcohol use: Yes    Alcohol/week: 1.0 standard drinks    Types: 1 Standard drinks or equivalent per week    Comment: occ  . Drug use: No     Allergies   Amoxicillin; Penicillins; and Suprax [cefixime]   Review of Systems Review of Systems  Gastrointestinal: Positive for abdominal pain and anorexia.  All other systems reviewed and are negative.    Physical Exam Triage Vital Signs ED Triage Vitals  Enc Vitals Group     BP 07/23/18 1748 114/74     Pulse Rate 07/23/18 1748 73     Resp --      Temp 07/23/18 1748 98.2 F (36.8 C)     Temp Source 07/23/18 1748 Oral  SpO2 07/23/18 1748 100 %     Weight --      Height --      Head Circumference --      Peak Flow --      Pain Score 07/23/18 1749 3     Pain Loc --      Pain Edu? --      Excl. in GC? --    No data found.  Updated Vital Signs BP 114/74 (BP Location: Left Arm)   Pulse 73   Temp 98.2 F (36.8 C) (Oral)   SpO2 100%   Visual Acuity Right Eye Distance:   Left Eye Distance:   Bilateral Distance:    Right Eye Near:   Left Eye Near:    Bilateral Near:     Physical Exam  Constitutional: She is oriented to person, place, and time. She appears well-developed and well-nourished.  HENT:  Head: Normocephalic.  Mouth/Throat: Oropharynx is clear and moist.  Eyes: Pupils are equal, round, and reactive to light.  Cardiovascular: Normal rate, regular rhythm and normal heart sounds.  Pulmonary/Chest: Effort normal.  Abdominal: Normal appearance and bowel sounds are normal. There is tenderness.  Neurological: She is alert and oriented to person, place, and time.  Skin: Skin is warm.  Psychiatric: She has a normal mood and affect.  Nursing note and vitals  reviewed.    UC Treatments / Results  Labs (all labs ordered are listed, but only abnormal results are displayed) Labs Reviewed  POCT URINALYSIS DIP (DEVICE) - Abnormal; Notable for the following components:      Result Value   Hgb urine dipstick TRACE (*)    All other components within normal limits  POCT I-STAT, CHEM 8  POCT PREGNANCY, URINE    EKG None  Radiology No results found.  Procedures Procedures (including critical care time)  Medications Ordered in UC Medications - No data to display  Initial Impression / Assessment and Plan / UC Course  I have reviewed the triage vital signs and the nursing notes.  Pertinent labs & imaging results that were available during my care of the patient were reviewed by me and considered in my medical decision making (see chart for details).     MDM  ua trace of blood, hemoglobin normal upt negative.   Pt advised to follow up with her Physician for recheck.  Final Clinical Impressions(s) / UC Diagnoses   Final diagnoses:  Generalized abdominal pain     Discharge Instructions     See your Physician for recheck.   Go to the Emergency department for evaluation if symptoms worsen or change.      ED Prescriptions    None     Controlled Substance Prescriptions [place] Controlled Substance Registry consulted? Not Applicable   Elson AreasSofia, Toron Bowring K, [place]PA-C 07/23/18 [id number]

## 2018-07-23 NOTE — Discharge Instructions (Addendum)
See your Physician for recheck or return here for recheck if symptoms persist. Go to the Emergency department for evaluation if symptoms worsen or change.

## 2018-07-26 ENCOUNTER — Encounter (HOSPITAL_COMMUNITY): Payer: Self-pay | Admitting: Emergency Medicine

## 2018-07-26 ENCOUNTER — Emergency Department (HOSPITAL_COMMUNITY)
Admission: EM | Admit: 2018-07-26 | Discharge: 2018-07-26 | Disposition: A | Discharge status: Home / Self Care | Payer: Self-pay | Attending: Emergency Medicine | Admitting: Emergency Medicine

## 2018-07-26 DIAGNOSIS — Z87891 Personal history of nicotine dependence: Secondary | ICD-10-CM | POA: Insufficient documentation

## 2018-07-26 DIAGNOSIS — R1084 Generalized abdominal pain: Secondary | ICD-10-CM | POA: Insufficient documentation

## 2018-07-26 DIAGNOSIS — Z79899 Other long term (current) drug therapy: Secondary | ICD-10-CM | POA: Insufficient documentation

## 2018-07-26 LAB — CBC
HCT: 40.3 % (ref 36.0–46.0)
HEMOGLOBIN: 13.1 g/dL (ref 12.0–15.0)
MCH: 33.2 pg (ref 26.0–34.0)
MCHC: 32.5 g/dL (ref 30.0–36.0)
MCV: 102.3 fL — AB (ref 80.0–100.0)
Platelets: 225 10*3/uL (ref 150–400)
RBC: 3.94 MIL/uL (ref 3.87–5.11)
RDW: 11.8 % (ref 11.5–15.5)
WBC: 6.7 10*3/uL (ref 4.0–10.5)
nRBC: 0 % (ref 0.0–0.2)

## 2018-07-26 LAB — COMPREHENSIVE METABOLIC PANEL
ALBUMIN: 4.3 g/dL (ref 3.5–5.0)
ALK PHOS: 31 U/L — AB (ref 38–126)
ALT: 12 U/L (ref 0–44)
AST: 16 U/L (ref 15–41)
Anion gap: 8 (ref 5–15)
BILIRUBIN TOTAL: 1 mg/dL (ref 0.3–1.2)
BUN: 17 mg/dL (ref 6–20)
CO2: 26 mmol/L (ref 22–32)
Calcium: 8.8 mg/dL — ABNORMAL LOW (ref 8.9–10.3)
Chloride: 105 mmol/L (ref 98–111)
Creatinine, Ser: 0.84 mg/dL (ref 0.44–1.00)
GFR calc Af Amer: >60 mL/min (ref >60)
GFR calc non Af Amer: >60 mL/min (ref >60)
GLUCOSE: 107 mg/dL — AB (ref 70–99)
Potassium: 3.7 mmol/L (ref 3.5–5.1)
SODIUM: 139 mmol/L (ref 135–145)
TOTAL PROTEIN: 7.2 g/dL (ref 6.5–8.1)

## 2018-07-26 LAB — I-STAT BETA HCG BLOOD, ED (MC, WL, AP ONLY)

## 2018-07-26 LAB — URINALYSIS, ROUTINE W REFLEX MICROSCOPIC
Bilirubin Urine: NEGATIVE
GLUCOSE, UA: NEGATIVE mg/dL
HGB URINE DIPSTICK: NEGATIVE
Ketones, ur: NEGATIVE mg/dL
Leukocytes, UA: NEGATIVE
Nitrite: NEGATIVE
PH: 9 — AB (ref 5.0–8.0)
Protein, ur: NEGATIVE mg/dL
SPECIFIC GRAVITY, URINE: 1.018 (ref 1.005–1.030)

## 2018-07-26 LAB — LIPASE, BLOOD: Lipase: 40 U/L (ref 11–51)

## 2018-07-26 MED ORDER — DICYCLOMINE HCL 20 MG PO TABS: 20.0000 mg | ORAL_TABLET | Freq: Three times a day (TID) | ORAL | 0 refills | Status: DC

## 2018-07-26 NOTE — ED Notes (Signed)
This nurse attempted to drawl ordered lab, unsuccessful, pt refusing to allow phelbotomist to drawl at this time.

## 2018-07-26 NOTE — ED Provider Notes (Signed)
[facility]-EMERGENCY DEPT Provider Note  CSN: [id number] Arrival date & time: 07/26/18  1557    History   Chief Complaint Chief Complaint  Patient presents with  . Abdominal Pain  . Bloated    HPI Donna Leach is a 27 y.o. female with no significant medical history who presented to the ED for    Abdominal Pain   This is a new problem. Episode onset: 1 month ago. Episode frequency: after meals. The problem has not changed since onset.The pain is associated with eating. The pain is located in the LUQ and LLQ. The quality of the pain is cramping. The pain is moderate. Associated symptoms include diarrhea and constipation. Pertinent negatives include fever, hematochezia, melena, nausea, vomiting, dysuria, frequency and hematuria. Associated symptoms comments: Endorses 10lb weight loss, but has been able to regain weight. The symptoms are aggravated by eating. Nothing relieves the symptoms. Past workup does not include GI consult or CT scan. Her past medical history does not include GERD, ulcerative colitis, Crohn's disease or irritable bowel syndrome.    Past Medical History:  Diagnosis Date  . Depression   . No pertinent past medical history     Patient Active Problem List   Diagnosis Date Noted  . Traumatic hematoma of finger 11/06/2016  . Depression 04/08/2016    Past Surgical History:  Procedure Laterality Date  . Adnoids    . APPENDECTOMY       OB History    Gravida  0   Para      Term      Preterm      AB      Living        SAB      TAB      Ectopic      Multiple      Live Births               Home Medications    Prior to Admission medications   Medication Sig Start Date End Date Taking? Authorizing Provider  etonogestrel (NEXPLANON) 68 MG IMPL implant Inject 1 each into the skin once. 11/11/11  Yes [provider]  dicyclomine (BENTYL) 20 MG tablet Take 1 tablet (20 mg total) by mouth 4 (four) times daily -   before meals and at bedtime. 07/26/18 08/25/18  Mortis, Jerrel Ivory I, PA-C  naproxen (NAPROSYN) 500 MG tablet Take 1 tablet (500 mg total) by mouth 2 (two) times daily with a meal. Patient not taking: Reported on 07/26/2018 11/18/16   Ofilia Neas, PA-C    Family History Family History  Problem Relation Age of Onset  . Hypertension Father   . Diabetes Father   . Diabetes Mother     Social History Social History   Tobacco Use  . Smoking status: Former Smoker    Years: 2.00    Last attempt to quit: 11/14/2016    Years since quitting: 1.6  . Smokeless tobacco: Never Used  . Tobacco comment: 3 cigs per day  Substance Use Topics  . Alcohol use: Yes    Alcohol/week: 1.0 standard drinks    Types: 1 Standard drinks or equivalent per week    Comment: occ  . Drug use: No     Allergies   Amoxicillin; Penicillins; and Suprax [cefixime]   Review of Systems Review of Systems  Constitutional: Negative for fever.  Gastrointestinal: Positive for abdominal pain, constipation and diarrhea. Negative for abdominal distention, hematochezia, melena, nausea and vomiting.  Genitourinary: Negative  for dysuria, frequency and hematuria.  All other systems reviewed and are negative.    Physical Exam Updated Vital Signs BP 130/86 (BP Location: Right Arm)   Pulse 79   Temp (!) 97.5 F (36.4 C) (Oral)   Resp 17   LMP 07/05/2018   SpO2 100%   Physical Exam  Constitutional: Vital signs are normal. She appears well-developed and well-nourished. She is cooperative. No distress.  Cardiovascular: Normal rate, regular rhythm, normal heart sounds and intact distal pulses.  Pulmonary/Chest: Effort normal and breath sounds normal.  Abdominal: Soft. Normal appearance and bowel sounds are normal. She exhibits no distension. There is no tenderness.  Neurological: She is alert.  Skin: Skin is warm and intact. Capillary refill takes less than 2 seconds.  Nursing note and vitals reviewed.    ED  Treatments / Results  Labs (all labs ordered are listed, but only abnormal results are displayed) Labs Reviewed  COMPREHENSIVE METABOLIC PANEL - Abnormal; Notable for the following components:      Result Value   Glucose, Bld 107 (*)    Calcium 8.8 (*)    Alkaline Phosphatase 31 (*)    All other components within normal limits  CBC - Abnormal; Notable for the following components:   MCV 102.3 (*)    All other components within normal limits  URINALYSIS, ROUTINE W REFLEX MICROSCOPIC - Abnormal; Notable for the following components:   APPearance CLOUDY (*)    pH 9.0 (*)    All other components within normal limits  LIPASE, BLOOD  I-STAT BETA HCG BLOOD, ED ([facility], [facility], [facility] ONLY)    EKG None  Radiology No results found.  Procedures Procedures (including critical care time)  Medications Ordered in ED Medications - No data to display   Initial Impression / Assessment and Plan / ED Course  Triage vital signs and the nursing notes have been reviewed.  Pertinent labs & imaging results that were available during care of the patient were reviewed and considered in medical decision making (see chart for details).  Patient presents to the ED afebrile and well appearing with a 1 month history of intermittent abdominal pain that is triggered by eating. Patient endorses a 10lb weight loss over the last 3-4 weeks, but states she has been able to gain some of it back. She also endorses changes in bowel habits with fluctuating diarrhea and constipation. History consistent with IBS or IBD. Physical exam unremarkable today which was expected and there are no s/s consistent with an acute abdomen. No indication for imaging at this moment. Will order basic abdominal labs to evaluate for infection, pancreatitis and electrolyte abnormalities that may have resulted from bowel changes.  Clinical Course as of Jul 26 1842  [facility] Jul 26, 2018  1820 Labs unremarkable.   [GM]    Clinical Course User  Index [GM] Mortis, Sharyon Medicus, PA-C   Final Clinical Impressions(s) / ED Diagnoses  1. Abdominal Pain. Possible IBS vs IBD given history. Rx for Bentyl prescribed for abdominal cramping. Referral to GI for further evaluation. Education provided on red flag s/s that warrant follow-up to emergency vs PCP or GI.  Dispo: Home. After thorough clinical evaluation, this patient is determined to be medically stable and can be safely discharged with the previously mentioned treatment and/or outpatient follow-up/referral(s). At this time, there are no other apparent medical conditions that require further screening, evaluation or treatment.  Final diagnoses:  Generalized abdominal pain    ED Discharge Orders  Ordered    dicyclomine (BENTYL) 20 MG tablet  3 times daily before meals & bedtime     07/26/18 1840    Ambulatory referral to Gastroenterology     07/26/18 [address]            Mortis, Roanna RaiderGabrielle I, PA-C 07/26/18 [address]1852    Knapp, Jon, MD 07/27/18 [id number]

## 2018-07-26 NOTE — Discharge Instructions (Addendum)
Your symptoms are consistent with IBS. I have placed a referral to a GI specialist that you can follow-up with. You can call them in the morning to schedule a follow-up appointment.  I have prescribed you Bentyl which should help with the cramping when you eat. You may also start taking a heartburn medicine such as Prilosec, Nexium, Zantac, etc to help with the bloating feeling.  It was good seeing you today. Take care!

## 2018-07-26 NOTE — ED Triage Notes (Signed)
Pt c/o left sided pains with bloating esp after eating. Pt reports this has been going on for weeks. Reports that she didn't notice that she wasn't eating over the past 2 weeks. Was seen at UC 3 days ago and was told labs and urine were good but if continues to go to ED. Reports her PCP retired and just has GYn. Reports that she canceled her health insurance because was very expensive and horrible coverage so she can try to get [employer] Care.

## 2018-07-27 ENCOUNTER — Encounter: Payer: Self-pay | Admitting: Gastroenterology

## 2018-08-03 ENCOUNTER — Ambulatory Visit (HOSPITAL_COMMUNITY)
Admission: EM | Admit: 2018-08-03 | Discharge: 2018-08-03 | Disposition: A | Discharge status: Home / Self Care | Payer: Self-pay | Attending: Family Medicine | Admitting: Family Medicine

## 2018-08-03 ENCOUNTER — Encounter (HOSPITAL_COMMUNITY): Payer: Self-pay | Admitting: Emergency Medicine

## 2018-08-03 DIAGNOSIS — Z880 Allergy status to penicillin: Secondary | ICD-10-CM | POA: Insufficient documentation

## 2018-08-03 DIAGNOSIS — L720 Epidermal cyst: Secondary | ICD-10-CM | POA: Insufficient documentation

## 2018-08-03 DIAGNOSIS — Z881 Allergy status to other antibiotic agents status: Secondary | ICD-10-CM | POA: Insufficient documentation

## 2018-08-03 DIAGNOSIS — R1084 Generalized abdominal pain: Secondary | ICD-10-CM

## 2018-08-03 DIAGNOSIS — Z9889 Other specified postprocedural states: Secondary | ICD-10-CM | POA: Insufficient documentation

## 2018-08-03 DIAGNOSIS — Z87891 Personal history of nicotine dependence: Secondary | ICD-10-CM | POA: Insufficient documentation

## 2018-08-03 DIAGNOSIS — Z79899 Other long term (current) drug therapy: Secondary | ICD-10-CM | POA: Insufficient documentation

## 2018-08-03 DIAGNOSIS — N390 Urinary tract infection, site not specified: Secondary | ICD-10-CM | POA: Insufficient documentation

## 2018-08-03 DIAGNOSIS — Z833 Family history of diabetes mellitus: Secondary | ICD-10-CM | POA: Insufficient documentation

## 2018-08-03 LAB — POCT URINALYSIS DIP (DEVICE)
BILIRUBIN URINE: NEGATIVE
Glucose, UA: NEGATIVE mg/dL
KETONES UR: NEGATIVE mg/dL
Nitrite: NEGATIVE
PH: 7 (ref 5.0–8.0)
PROTEIN: NEGATIVE mg/dL
SPECIFIC GRAVITY, URINE: 1.01 (ref 1.005–1.030)
Urobilinogen, UA: 0.2 mg/dL (ref 0.0–1.0)

## 2018-08-03 LAB — POCT PREGNANCY, URINE: PREG TEST UR: NEGATIVE

## 2018-08-03 MED ORDER — NITROFURANTOIN MONOHYD MACRO 100 MG PO CAPS: 100.0000 mg | ORAL_CAPSULE | Freq: Two times a day (BID) | ORAL | 0 refills | Status: DC

## 2018-08-03 NOTE — Discharge Instructions (Signed)
Make sure that you are drinking plenty of water I have prescribed nitrofurantoin for bladder infection.  Take 1 2 times a day for 5 days See the GI specialist as scheduled. Follow high-fiber diet As we discussed the cyst under your arm is nothing to worry about Find a PCP for follow-up  We did lab testing during this visit.  If there are any abnormal findings that require change in medicine or indicate a positive result, you will be notified.  If all of your tests are normal, you will not be called.

## 2018-08-03 NOTE — ED Provider Notes (Signed)
[facility]    CSN: [id number] Arrival date & time: 08/03/18  1100     History   Chief Complaint Chief Complaint  Patient presents with  . Abdominal Pain    HPI Donna Leach is a 27 y.o. female.   HPI  Patient is here for a routine visit.  When asked her reason for visit she states "oh, several things".  She continues to have abdominal pain.  She is been seen for abdominal pain twice this month.  She has been told she has irritable bowel syndrome.  She has been provided with Bentyl.  She has a GI specialty appointment coming up in a couple weeks.  We briefly discussed IBS, high-fiber diet, and the need to follow-up with GI specialty.  Abdominal pain, chronic, is expected with this diagnosis. She also has some cold symptoms.  The started in the waiting room.  I told her that she likely had a virus and will be happy to see her if she gets worse instead of better. She has a small bump under her right arm.  She showed it to several physicians.  It worries her because she knows that lumps in the armpit could be from breast problems or breast cancer.  She would like this checked again. She worries she might have a bladder infection.  She is  worried about STDs, has had unprotected sexual relations, has no symptoms  Past Medical History:  Diagnosis Date  . Depression   . No pertinent past medical history     Patient Active Problem List   Diagnosis Date Noted  . Traumatic hematoma of finger 11/06/2016  . Depression 04/08/2016    Past Surgical History:  Procedure Laterality Date  . Adnoids    . APPENDECTOMY      OB History    Gravida  0   Para      Term      Preterm      AB      Living        SAB      TAB      Ectopic      Multiple      Live Births               Home Medications    Prior to Admission medications   Medication Sig Start Date End Date Taking? Authorizing Provider  dicyclomine (BENTYL) 20 MG tablet Take 1 tablet (20 mg  total) by mouth 4 (four) times daily -  before meals and at bedtime. 07/26/18 08/25/18  Mortis, Sharyon Medicus, PA-C  etonogestrel (NEXPLANON) 68 MG IMPL implant Inject 1 each into the skin once. 11/11/11   [provider]  nitrofurantoin, macrocrystal-monohydrate, (MACROBID) 100 MG capsule Take 1 capsule (100 mg total) by mouth 2 (two) times daily. 08/03/18   Eustace Moore, MD    Family History Family History  Problem Relation Age of Onset  . Hypertension Father   . Diabetes Father   . Diabetes Mother     Social History Social History   Tobacco Use  . Smoking status: Former Smoker    Years: 2.00    Last attempt to quit: 11/14/2016    Years since quitting: 1.7  . Smokeless tobacco: Never Used  . Tobacco comment: 3 cigs per day  Substance Use Topics  . Alcohol use: Yes    Alcohol/week: 1.0 standard drinks    Types: 1 Standard drinks or equivalent per week    Comment: occ  .  Drug use: No     Allergies   Amoxicillin; Penicillins; and Suprax [cefixime]   Review of Systems Review of Systems  Constitutional: Negative for chills and fever.  HENT: Positive for congestion. Negative for ear pain and sore throat.   Eyes: Negative for pain and visual disturbance.  Respiratory: Negative for cough and shortness of breath.   Cardiovascular: Negative for chest pain and palpitations.  Gastrointestinal: Positive for abdominal distention and abdominal pain. Negative for constipation, diarrhea and vomiting.  Genitourinary: Negative for dysuria and hematuria.  Musculoskeletal: Negative for arthralgias and back pain.  Skin: Negative for color change and rash.       Bump in axilla  Neurological: Negative for seizures and syncope.  All other systems reviewed and are negative.    Physical Exam Triage Vital Signs ED Triage Vitals  Enc Vitals Group     BP 08/03/18 1158 122/85     Pulse Rate 08/03/18 1158 63     Resp 08/03/18 1158 18     Temp 08/03/18 1158 98.1 F (36.7 C)      Temp Source 08/03/18 1158 Oral     SpO2 08/03/18 1158 100 %     Weight --      Height --      Head Circumference --      Peak Flow --      Pain Score 08/03/18 1159 4     Pain Loc --      Pain Edu? --      Excl. in GC? --    No data found.  Updated Vital Signs BP 122/85 (BP Location: Right Arm)   Pulse 63   Temp 98.1 F (36.7 C) (Oral)   Resp 18   LMP 07/05/2018   SpO2 100%      Physical Exam  Constitutional: She appears well-developed and well-nourished.  Non-toxic appearance. No distress.  HENT:  Head: Normocephalic and atraumatic.  Mouth/Throat: Oropharynx is clear and moist.  Eyes: Pupils are equal, round, and reactive to light. Conjunctivae are normal.  Neck: Normal range of motion.  Cardiovascular: Normal rate.  Pulmonary/Chest: Effort normal. No respiratory distress.  Abdominal: Soft. Bowel sounds are normal. She exhibits no distension. There is no hepatosplenomegaly. There is no tenderness.  Musculoskeletal: Normal range of motion. She exhibits no edema.  Neurological: She is alert.  Skin: Skin is warm and dry.  In the right axilla there is a 1 cm mobile skin deep nodule.  With pressure on the nodule sebum is expressed.  This is demonstrated to the patient.  This is a simple cyst.  She states that she is quite relieved.  Psychiatric: Her behavior is normal.  Seems anxious     UC Treatments / Results  Labs (all labs ordered are listed, but only abnormal results are displayed) Labs Reviewed  POCT URINALYSIS DIP (DEVICE) - Abnormal; Notable for the following components:      Result Value   Hgb urine dipstick TRACE (*)    Leukocytes, UA TRACE (*)    All other components within normal limits  POCT PREGNANCY, URINE  CERVICOVAGINAL ANCILLARY ONLY    EKG None  Radiology No results found.  Procedures Procedures (including critical care time)  Medications Ordered in UC Medications - No data to display  Initial Impression / Assessment and Plan / UC  Course  I have reviewed the triage vital signs and the nursing notes.  Pertinent labs & imaging results that were available during my care of the patient  were reviewed by me and considered in my medical decision making (see chart for details).     Chronic medical problems.  Suspect some anxiety.  IBS with chronic abdominal pain. Only abnormality UA.  Will send for culture, and cover her with Macrobid. Final Clinical Impressions(s) / UC Diagnoses   Final diagnoses:  Generalized abdominal pain  Lower urinary tract infection  Epithelial inclusion cyst     Discharge Instructions     Make sure that you are drinking plenty of water I have prescribed nitrofurantoin for bladder infection.  Take 1 2 times a day for 5 days See the GI specialist as scheduled. Follow high-fiber diet As we discussed the cyst under your arm is nothing to worry about Find a PCP for follow-up  We did lab testing during this visit.  If there are any abnormal findings that require change in medicine or indicate a positive result, you will be notified.  If all of your tests are normal, you will not be called.       ED Prescriptions    Medication Sig Dispense Auth. Provider   nitrofurantoin, macrocrystal-monohydrate, (MACROBID) 100 MG capsule Take 1 capsule (100 mg total) by mouth 2 (two) times daily. 10 capsule Eustace MooreNelson,  Sue, MD     Controlled Substance Prescriptions [place] Controlled Substance Registry consulted? Not Applicable   Eustace MooreNelson,  Sue, MD 08/03/18 2031

## 2018-08-03 NOTE — ED Triage Notes (Signed)
Pt here with lower abd pain with issues with both constipation and diarrhea; pt sts some dry skin; pt sts UTI sx; pt sts sore throat

## 2018-08-04 ENCOUNTER — Telehealth (HOSPITAL_COMMUNITY): Payer: Self-pay | Admitting: Emergency Medicine

## 2018-08-04 LAB — CERVICOVAGINAL ANCILLARY ONLY
Chlamydia: NEGATIVE
Neisseria Gonorrhea: NEGATIVE
Trichomonas: POSITIVE — AB

## 2018-08-04 MED ORDER — METRONIDAZOLE 500 MG PO TABS: 2000.0000 mg | ORAL_TABLET | Freq: Once | ORAL | 0 refills | Status: AC

## 2018-08-04 NOTE — Telephone Encounter (Signed)
Trichomonas is positive. Rx  for Flagyl 2 grams, once was sent to the pharmacy of record. Pt needs education to refrain from sexual intercourse for 7 days to give the medicine time to work. Sexual partners need to be notified and tested/treated. Condoms may reduce risk of reinfection. Recheck for further evaluation if symptoms are not improving.   Attempted to reach patient. No answer at this time. Voicemail left.     

## 2018-08-07 ENCOUNTER — Telehealth (HOSPITAL_COMMUNITY): Payer: Self-pay | Admitting: Emergency Medicine

## 2018-08-07 MED ORDER — METRONIDAZOLE 500 MG PO TABS: ORAL_TABLET | ORAL | 0 refills | Status: DC

## 2018-08-07 NOTE — Telephone Encounter (Signed)
Patient called asking about her results. All questions answered.

## 2018-08-20 ENCOUNTER — Ambulatory Visit: Payer: Self-pay | Admitting: Gastroenterology

## 2018-09-18 DIAGNOSIS — Z113 Encounter for screening for infections with a predominantly sexual mode of transmission: Secondary | ICD-10-CM | POA: Diagnosis not present

## 2018-09-20 DIAGNOSIS — Z113 Encounter for screening for infections with a predominantly sexual mode of transmission: Secondary | ICD-10-CM | POA: Diagnosis not present

## 2018-09-20 NOTE — Progress Notes (Deleted)
Referring Provider: Windy Carina, PA* Primary Care Physician:  Silverio Lay, MD   Reason for Consultation: Abdominal pain and weight loss  IMPRESSION:  ***  PLAN: ***   HPI: Donna Leach is a 28 y.o. female seen in consultation at the request of PA mortise for further evaluation of abdominal pain and weight loss.  The history is obtained to the patient and review of her electronic health record.  She has had 4 visits to the emergency department for the symptoms since August 2019.  She was told that she had irritable bowel syndrome and treated with Bentyl.  Labs from 07/26/2018 show a normal comprehensive metabolic panel including normal liver enzymes.  Her lipase was 40.  CBC is normal with a hemoglobin of 13.1, MCV 102, RDW 11.8.   Quality: Amount: Duration: Timing: Progression: Chronicity: Context: Similar prior episodes: Relieved by: Worsened by: Effective treatments: Ineffective treatments: Associated symptoms: Risks factors:   Past Medical History:  Diagnosis Date  . Depression   . No pertinent past medical history     Past Surgical History:  Procedure Laterality Date  . Adnoids    . APPENDECTOMY      Current Outpatient Medications  Medication Sig Dispense Refill  . dicyclomine (BENTYL) 20 MG tablet Take 1 tablet (20 mg total) by mouth 4 (four) times daily -  before meals and at bedtime. 120 tablet 0  . etonogestrel (NEXPLANON) 68 MG IMPL implant Inject 1 each into the skin once.    . metroNIDAZOLE (FLAGYL) 500 MG tablet Take all four tablets at once (2000 mg) for one dose 4 tablet 0  . nitrofurantoin, macrocrystal-monohydrate, (MACROBID) 100 MG capsule Take 1 capsule (100 mg total) by mouth 2 (two) times daily. 10 capsule 0   No current facility-administered medications for this visit.     Allergies as of 09/21/2018 - Review Complete 08/03/2018  Allergen Reaction Noted  . Amoxicillin Hives 12/30/2011  . Penicillins Hives 12/30/2011  .  Suprax [cefixime]  01/24/2014    Family History  Problem Relation Age of Onset  . Hypertension Father   . Diabetes Father   . Diabetes Mother     Social History   Socioeconomic History  . Marital status: Single    Spouse name: Not on file  . Number of children: Not on file  . Years of education: Not on file  . Highest education level: Not on file  Occupational History  . Not on file  Social Needs  . Financial resource strain: Not on file  . Food insecurity:    Worry: Not on file    Inability: Not on file  . Transportation needs:    Medical: Not on file    Non-medical: Not on file  Tobacco Use  . Smoking status: Former Smoker    Years: 2.00    Last attempt to quit: 11/14/2016    Years since quitting: 1.8  . Smokeless tobacco: Never Used  . Tobacco comment: 3 cigs per day  Substance and Sexual Activity  . Alcohol use: Yes    Alcohol/week: 1.0 standard drinks    Types: 1 Standard drinks or equivalent per week    Comment: occ  . Drug use: No  . Sexual activity: Yes    Birth control/protection: Implant  Lifestyle  . Physical activity:    Days per week: Not on file    Minutes per session: Not on file  . Stress: Not on file  Relationships  . Social connections:  Talks on phone: Not on file    Gets together: Not on file    Attends religious service: Not on file    Active member of club or organization: Not on file    Attends meetings of clubs or organizations: Not on file    Relationship status: Not on file  . Intimate partner violence:    Fear of current or ex partner: Not on file    Emotionally abused: Not on file    Physically abused: Not on file    Forced sexual activity: Not on file  Other Topics Concern  . Not on file  Social History Narrative  . Not on file    Review of Systems: 12 system ROS is negative except as noted above.  There were no vitals filed for this visit.  Physical Exam: Vital signs were reviewed. General:   Alert, well-nourished,  pleasant and cooperative in NAD Head:  Normocephalic and atraumatic. Eyes:  Sclera clear, no icterus.   Conjunctiva pink. Mouth:  No deformity or lesions.   Neck:  Supple; no thyromegaly. Lungs:  Clear throughout to auscultation.   No wheezes.  Heart:  Regular rate and rhythm; no murmurs Abdomen:  Soft, nontender, normal bowel sounds. No rebound or guarding. No hepatosplenomegaly Rectal:  Deferred  Msk:  Symmetrical without gross deformities. Extremities:  No gross deformities or edema. Neurologic:  Alert and  oriented x4;  grossly nonfocal Skin:  No rash or bruise. Psych:  Alert and cooperative. Normal mood and affect.   Carma Dwiggins L. Orvan Falconer, MD, MPH [facility] 09/20/2018, 2:40 PM

## 2018-09-21 ENCOUNTER — Ambulatory Visit: Payer: Self-pay | Admitting: Gastroenterology

## 2018-09-29 ENCOUNTER — Ambulatory Visit (HOSPITAL_COMMUNITY)
Admission: EM | Admit: 2018-09-29 | Discharge: 2018-09-29 | Disposition: A | Discharge status: Home / Self Care | Payer: BLUE CROSS/BLUE SHIELD | Attending: Family Medicine | Admitting: Family Medicine

## 2018-09-29 ENCOUNTER — Encounter (HOSPITAL_COMMUNITY): Payer: Self-pay

## 2018-09-29 DIAGNOSIS — B360 Pityriasis versicolor: Secondary | ICD-10-CM

## 2018-09-29 MED ORDER — FLUCONAZOLE 200 MG PO TABS: ORAL_TABLET | ORAL | 0 refills | Status: DC

## 2018-09-29 NOTE — ED Provider Notes (Signed)
[facility]    CSN: [id number] Arrival date & time: 09/29/18  1247     History   Chief Complaint Chief Complaint  Patient presents with  . Swollen lymph nodes in neck  . Knots/Swollen on back of neck    HPI Donna Leach is a 28 y.o. female.   This is an established 28 year old woman who presents to the [facility] urgent care with complaints of swollen lymph nodes in side of neck and knots/swollen area on  Back of neck area.  Associated with sore throat.  Onset:  4 months  Past evaluations:  X 4 with no dx forthcoming     Past Medical History:  Diagnosis Date  . Depression   . No pertinent past medical history     Patient Active Problem List   Diagnosis Date Noted  . Traumatic hematoma of finger 11/06/2016  . Depression 04/08/2016    Past Surgical History:  Procedure Laterality Date  . Adnoids    . APPENDECTOMY      OB History    Gravida  0   Para      Term      Preterm      AB      Living        SAB      TAB      Ectopic      Multiple      Live Births               Home Medications    Prior to Admission medications   Medication Sig Start Date End Date Taking? Authorizing Provider  dicyclomine (BENTYL) 20 MG tablet Take 1 tablet (20 mg total) by mouth 4 (four) times daily -  before meals and at bedtime. 07/26/18 08/25/18  Mortis, Sharyon Medicus, PA-C  etonogestrel (NEXPLANON) 68 MG IMPL implant Inject 1 each into the skin once. 11/11/11   [provider]  fluconazole (DIFLUCAN) 200 MG tablet Two tablets today 09/29/18   Elvina Sidle, MD    Family History Family History  Problem Relation Age of Onset  . Hypertension Father   . Diabetes Father   . Diabetes Mother     Social History Social History   Tobacco Use  . Smoking status: Former Smoker    Years: 2.00    Last attempt to quit: 11/14/2016    Years since quitting: 1.8  . Smokeless tobacco: Never Used  . Tobacco comment: 3 cigs per day  Substance  Use Topics  . Alcohol use: Yes    Alcohol/week: 1.0 standard drinks    Types: 1 Standard drinks or equivalent per week    Comment: occ  . Drug use: No     Allergies   Amoxicillin; Penicillins; and Suprax [cefixime]   Review of Systems Review of Systems   Physical Exam Triage Vital Signs ED Triage Vitals  Enc Vitals Group     BP 09/29/18 1351 119/68     Pulse Rate 09/29/18 1351 62     Resp 09/29/18 1351 18     Temp 09/29/18 1351 98.1 F (36.7 C)     Temp Source 09/29/18 1351 Oral     SpO2 09/29/18 1351 100 %     Weight --      Height --      Head Circumference --      Peak Flow --      Pain Score 09/29/18 1354 4     Pain Loc --  Pain Edu? --      Excl. in GC? --    No data found.  Updated Vital Signs BP 119/68 (BP Location: Right Arm)   Pulse 62   Temp 98.1 F (36.7 C) (Oral)   Resp 18   SpO2 100%    Physical Exam Vitals signs and nursing note reviewed.  Constitutional:      Appearance: Normal appearance.  HENT:     Head: Normocephalic.     Right Ear: Tympanic membrane normal.     Left Ear: Tympanic membrane normal.     Mouth/Throat:     Mouth: Mucous membranes are moist.     Pharynx: Oropharynx is clear.  Neck:     Musculoskeletal: Normal range of motion and neck supple.  Musculoskeletal: Normal range of motion.  Lymphadenopathy:     Cervical: No cervical adenopathy.  Skin:    General: Skin is warm and dry.     Comments: Diffuse body rash that is scaly and salmon-colored and confluent over the entire torso and back of her neck.  Neurological:     General: No focal deficit present.     Mental Status: She is alert and oriented to person, place, and time.  Psychiatric:        Mood and Affect: Mood normal.      UC Treatments / Results  Labs (all labs ordered are listed, but only abnormal results are displayed) Labs Reviewed - No data to display  EKG None  Radiology No results found.  Procedures Procedures (including critical care  time)  Medications Ordered in UC Medications - No data to display  Initial Impression / Assessment and Plan / UC Course  I have reviewed the triage vital signs and the nursing notes.  Pertinent labs & imaging results that were available during my care of the patient were reviewed by me and considered in my medical decision making (see chart for details).    Final Clinical Impressions(s) / UC Diagnoses   Final diagnoses:  Tinea versicolor   Discharge Instructions   None    ED Prescriptions    Medication Sig Dispense Auth. Provider   fluconazole (DIFLUCAN) 200 MG tablet Two tablets today 2 tablet Elvina Sidle, MD     Controlled Substance Prescriptions [place] Controlled Substance Registry consulted? Not Applicable   Elvina Sidle, MD 09/29/18 [phone]

## 2018-09-29 NOTE — ED Triage Notes (Signed)
Pt presents with swollen lymph nodes in side of neck and knots/swollen area on  Back of neck area.

## 2018-10-04 DIAGNOSIS — M545 Low back pain: Secondary | ICD-10-CM | POA: Diagnosis not present

## 2018-10-04 DIAGNOSIS — M542 Cervicalgia: Secondary | ICD-10-CM | POA: Diagnosis not present

## 2018-10-04 DIAGNOSIS — M546 Pain in thoracic spine: Secondary | ICD-10-CM | POA: Diagnosis not present

## 2018-10-29 ENCOUNTER — Ambulatory Visit (INDEPENDENT_AMBULATORY_CARE_PROVIDER_SITE_OTHER): Payer: BLUE CROSS/BLUE SHIELD | Admitting: Internal Medicine

## 2018-10-29 ENCOUNTER — Encounter: Payer: Self-pay | Admitting: Internal Medicine

## 2018-10-29 ENCOUNTER — Ambulatory Visit: Payer: BLUE CROSS/BLUE SHIELD | Admitting: Internal Medicine

## 2018-10-29 VITALS — BP 110/70 | HR 100 | Temp 98.8°F | Ht 68.0 in | Wt 133.2 lb

## 2018-10-29 DIAGNOSIS — R634 Abnormal weight loss: Secondary | ICD-10-CM | POA: Diagnosis not present

## 2018-10-29 DIAGNOSIS — Z113 Encounter for screening for infections with a predominantly sexual mode of transmission: Secondary | ICD-10-CM | POA: Diagnosis not present

## 2018-10-29 DIAGNOSIS — R5383 Other fatigue: Secondary | ICD-10-CM

## 2018-10-29 DIAGNOSIS — E049 Nontoxic goiter, unspecified: Secondary | ICD-10-CM | POA: Diagnosis not present

## 2018-10-29 DIAGNOSIS — Z7289 Other problems related to lifestyle: Secondary | ICD-10-CM | POA: Diagnosis not present

## 2018-10-29 DIAGNOSIS — Z9189 Other specified personal risk factors, not elsewhere classified: Secondary | ICD-10-CM

## 2018-10-29 LAB — COMPREHENSIVE METABOLIC PANEL
ALT: 11 U/L (ref 0–35)
AST: 13 U/L (ref 0–37)
Albumin: 4.5 g/dL (ref 3.5–5.2)
Alkaline Phosphatase: 32 U/L — ABNORMAL LOW (ref 39–117)
BUN: 27 mg/dL — ABNORMAL HIGH (ref 6–23)
CO2: 27 mEq/L (ref 19–32)
Calcium: 9.3 mg/dL (ref 8.4–10.5)
Chloride: 104 mEq/L (ref 96–112)
Creatinine, Ser: 0.89 mg/dL (ref 0.40–1.20)
GFR: 74.96 mL/min (ref >60.00)
Glucose, Bld: 108 mg/dL — ABNORMAL HIGH (ref 70–99)
Potassium: 4.9 mEq/L (ref 3.5–5.1)
Sodium: 138 mEq/L (ref 135–145)
Total Bilirubin: 0.6 mg/dL (ref 0.2–1.2)
Total Protein: 6.7 g/dL (ref 6.0–8.3)

## 2018-10-29 LAB — CBC WITH DIFFERENTIAL/PLATELET
Basophils Absolute: 0 10*3/uL (ref 0.0–0.1)
Basophils Relative: 0.7 % (ref 0.0–3.0)
Eosinophils Absolute: 0.1 10*3/uL (ref 0.0–0.7)
Eosinophils Relative: 1.6 % (ref 0.0–5.0)
HCT: 40.9 % (ref 36.0–46.0)
Hemoglobin: 14.1 g/dL (ref 12.0–15.0)
Lymphocytes Relative: 30.8 % (ref 12.0–46.0)
Lymphs Abs: 1.6 10*3/uL (ref 0.7–4.0)
MCHC: 34.5 g/dL (ref 30.0–36.0)
MCV: 98.2 fl (ref 78.0–100.0)
Monocytes Absolute: 0.4 10*3/uL (ref 0.1–1.0)
Monocytes Relative: 7.6 % (ref 3.0–12.0)
Neutro Abs: 3.1 10*3/uL (ref 1.4–7.7)
Neutrophils Relative %: 59.3 % (ref 43.0–77.0)
Platelets: 207 10*3/uL (ref 150.0–400.0)
RBC: 4.17 Mil/uL (ref 3.87–5.11)
RDW: 12.5 % (ref 11.5–15.5)
WBC: 5.2 10*3/uL (ref 4.0–10.5)

## 2018-10-29 LAB — TSH: TSH: 2.52 u[IU]/mL (ref 0.35–4.50)

## 2018-10-29 LAB — VITAMIN B12: VITAMIN B 12: 270 pg/mL (ref 211–911)

## 2018-10-29 LAB — T3, FREE: T3, Free: 2.9 pg/mL (ref 2.3–4.2)

## 2018-10-29 LAB — T4, FREE: Free T4: 0.86 ng/dL (ref 0.60–1.60)

## 2018-10-29 NOTE — Patient Instructions (Addendum)
Nice meeting you today.  Instructions: -will draw blood work today and notify you of results -referral for thyroid ultrasound has been placed -follow up in 3 months for a physical

## 2018-10-29 NOTE — Progress Notes (Signed)
New Patient Office Visit     CC/Reason for Visit: throat pain Previous PCP: Dr. Yong Channel & Dr. Cleta Alberts   HPI: Donna Leach is a 28 y.o. female who is coming in today for the above mentioned reasons. Past Medical History is significant for depression.  Patient c/o throat discomfort on the outside of her throat.  She c/o swelling lymph nodes, brittle nails and voice changes x6 months.  She went to the emergency room for fatigue and diarrhea and was dx with IBS.  She c/o generalized fatigue, palpitations and weight loss of 10 pounds x6 months.  She does have a GYN that she sees, but is ok receiving that care here.  She has had unprotected sex and requested any blood to test for any STIs.  She has been tested for HIV once in the past, but requests being tested again.     Past Medical/Surgical History: Past Medical History:  Diagnosis Date  . Depression   . No pertinent past medical history     Past Surgical History:  Procedure Laterality Date  . Adnoids    . APPENDECTOMY      Social History:  reports that she quit smoking about 1 years ago. She quit after 2.00 years of use. She has never used smokeless tobacco. She reports current alcohol use of about 1.0 standard drinks of alcohol per week. She reports that she does not use drugs.  Allergies: Allergies  Allergen Reactions  . Amoxicillin Hives  . Penicillins Hives    Has patient had a PCN reaction causing immediate rash, facial/tongue/throat swelling, SOB or lightheadedness with hypotension: Y Has patient had a PCN reaction causing severe rash involving mucus membranes or skin necrosis: Y Has patient had a PCN reaction that required hospitalization: N Has patient had a PCN reaction occurring within the last 10 years: N If all of the above answers are "NO", then may proceed with Cephalosporin use.   Alphonsa Gin [Cefixime]     Family History:  Family History  Problem Relation Age of Onset  . Hypertension Father   . Diabetes  Father   . Diabetes Mother      Current Outpatient Medications:  .  etonogestrel (NEXPLANON) 68 MG IMPL implant, Inject into the skin., Disp: , Rfl:  .  Multiple Vitamins-Minerals (WOMENS MULTIVITAMIN PO), Take by mouth., Disp: , Rfl:   Review of Systems:  Constitutional: Denies fever, chills, diaphoresis, appetite change.  C/o fatigue and weight loss HEENT: Denies photophobia, eye pain, redness, hearing loss, ear pain, congestion,  rhinorrhea, sneezing, mouth sores, trouble swallowing, neck pain, neck stiffness and tinnitus.  C/o swollen throat, voice changes and swollen lymph nodes Respiratory: Denies SOB, DOE, cough, chest tightness,  and wheezing.   Cardiovascular: Denies chest pain and leg swelling. C/o palpitations that come and go Gastrointestinal: Denies nausea, vomiting, abdominal pain, constipation, blood in stool and abdominal distention. C/o diarrhea Genitourinary: Denies dysuria, urgency, frequency, hematuria, flank pain and difficulty urinating.  Endocrine: Denies: hot or cold intolerance, sweats, changes in hair, polyuria, polydipsia.  C/o brittle nails and yellow colored Musculoskeletal: Denies myalgias, back pain, joint swelling, arthralgias and gait problem.  Skin: Denies pallor, rash and wound.  Neurological: Denies dizziness, seizures, syncope, weakness, light-headedness, numbness and headaches.  Hematological: Denies adenopathy. Easy bruising, personal or family bleeding history  Psychiatric/Behavioral: Denies suicidal ideation, mood changes, confusion, nervousness, sleep disturbance and agitation   Physical Exam: Vitals:   10/29/18 1021  BP: 110/70  Pulse: 100  Temp: 98.8  F (37.1 C)  TempSrc: Oral  SpO2: 98%  Weight: 133 lb 3.2 oz (60.4 kg)  Height: 5\' 8"  (1.727 m)   Body mass index is 20.25 kg/m.   Constitutional: NAD, calm, comfortable Eyes: PERRL, lids and conjunctivae normal ENMT: Mucous membranes are moist. Posterior pharynx clear of any exudate  or lesions. Normal dentition. Tympanic membrane is pearly white, no erythema or bulging. Neck: supple, no masses, firm felt thyromegaly and submandibular LAD Respiratory: clear to auscultation bilaterally, no wheezing, no crackles. Normal respiratory effort. No accessory muscle use.  Cardiovascular: Regular rate and rhythm, no murmurs / rubs / gallops. No extremity edema.   Psychiatric: Normal judgment and insight. Alert and oriented x 3. Normal mood.    Impression and Plan:  Thyroid enlargement  Fatigue, unspecified type  Weight loss -All symptoms sound very suspicious for hyperthyroidism. -I am somewhat concerned about firm thyromegaly noted on exam. -TSH/T3/T4 as well as thyroid US requested. -Further w/u pending results.  At risk for STIs due to unprotected sex -She had STD screen in Nov. Was positive for Trichomonas and was treated. -Was told to have HIV reordered in 8 weeks. -Will order HIV, hep panel and RPR. She is not interested in repeat pelvic.    Patient Instructions  Nice meeting you today.  Instructions: -will draw blood work today and notify you of results -referral for thyroid ultrasound has been placed -follow up in 3 months for a physical      Murlean Iba, RN DNP [facility]

## 2018-11-01 ENCOUNTER — Telehealth: Payer: Self-pay | Admitting: Internal Medicine

## 2018-11-01 ENCOUNTER — Other Ambulatory Visit: Payer: Self-pay | Admitting: Internal Medicine

## 2018-11-01 ENCOUNTER — Ambulatory Visit: Payer: BLUE CROSS/BLUE SHIELD

## 2018-11-01 ENCOUNTER — Ambulatory Visit (INDEPENDENT_AMBULATORY_CARE_PROVIDER_SITE_OTHER): Payer: BLUE CROSS/BLUE SHIELD | Admitting: *Deleted

## 2018-11-01 DIAGNOSIS — R634 Abnormal weight loss: Secondary | ICD-10-CM

## 2018-11-01 DIAGNOSIS — E538 Deficiency of other specified B group vitamins: Secondary | ICD-10-CM

## 2018-11-01 DIAGNOSIS — R5383 Other fatigue: Secondary | ICD-10-CM

## 2018-11-01 DIAGNOSIS — E049 Nontoxic goiter, unspecified: Secondary | ICD-10-CM

## 2018-11-01 LAB — HIV ANTIBODY (ROUTINE TESTING W REFLEX): HIV 1&2 Ab, 4th Generation: NONREACTIVE

## 2018-11-01 LAB — HEPATITIS C ANTIBODY
Hepatitis C Ab: NONREACTIVE
SIGNAL TO CUT-OFF: 0.01 (ref <1.00)

## 2018-11-01 LAB — RPR: RPR Ser Ql: NONREACTIVE

## 2018-11-01 MED ORDER — CYANOCOBALAMIN 1000 MCG/ML IJ SOLN
1000.0000 ug | Freq: Once | INTRAMUSCULAR | Status: AC
  Administered 2018-11-01: 1000 ug via INTRAMUSCULAR

## 2018-11-01 NOTE — Progress Notes (Signed)
Per orders of Dr. Fabian Sharp (Dr Ardyth Harps), injection of Vit B12 given by Maisie Fus. Patient tolerated injection well.

## 2018-11-01 NOTE — Telephone Encounter (Signed)
Pt here today for a B12 injection  States that on Saturday she passed/blacked out and EMS was called.  EMS performed an EKG where they noticed 7 deviations while she was sitting still Orthostatics were also obtained and when going from laying to sitting to standing there was a 30 point systolic difference.   Pt scheduled a follow up to discuss the EMS visit on Thursday 11/04/2018 at 1pm  Will send to Dr Ardyth Harps as Lorain Childes

## 2018-11-03 ENCOUNTER — Other Ambulatory Visit: Payer: Self-pay | Admitting: Internal Medicine

## 2018-11-03 ENCOUNTER — Ambulatory Visit
Admission: RE | Admit: 2018-11-03 | Discharge: 2018-11-03 | Disposition: A | Discharge status: Home / Self Care | Payer: BLUE CROSS/BLUE SHIELD | Source: Ambulatory Visit | Attending: Internal Medicine | Admitting: Internal Medicine

## 2018-11-03 DIAGNOSIS — R634 Abnormal weight loss: Secondary | ICD-10-CM

## 2018-11-03 DIAGNOSIS — E0590 Thyrotoxicosis, unspecified without thyrotoxic crisis or storm: Secondary | ICD-10-CM | POA: Diagnosis not present

## 2018-11-03 DIAGNOSIS — R5383 Other fatigue: Secondary | ICD-10-CM

## 2018-11-03 DIAGNOSIS — E049 Nontoxic goiter, unspecified: Secondary | ICD-10-CM

## 2018-11-04 ENCOUNTER — Encounter: Payer: Self-pay | Admitting: Internal Medicine

## 2018-11-04 ENCOUNTER — Ambulatory Visit: Payer: BLUE CROSS/BLUE SHIELD | Admitting: Internal Medicine

## 2018-11-04 VITALS — BP 102/78 | HR 66 | Temp 98.6°F | Wt 131.6 lb

## 2018-11-04 DIAGNOSIS — R55 Syncope and collapse: Secondary | ICD-10-CM

## 2018-11-04 DIAGNOSIS — E069 Thyroiditis, unspecified: Secondary | ICD-10-CM | POA: Insufficient documentation

## 2018-11-04 NOTE — Progress Notes (Signed)
Established Patient Office Visit     CC/Reason for Visit: syncope, f/u thyroid issues  HPI: Donna Leach is a 28 y.o. female who is coming in today for the above mentioned reasons. She was just seen on 2/21 for a CPE where thyroid enlargement was noted on exam in addition to symptoms of hyperthyroidism. Surprisingly TSH/T3/T4 were are normal. Thyroid US was without discrete nodules but suspicious for thyroiditis. In the interim, she had a syncopal episode at home. She does not remember the particulars, but had just gotten out of the shower. She was likely out for a few seconds only. Parents called EMS. She was told that "her HR went up 30 points from lying to standing and she had 7 abnormalities on EKG". No further details are available. She did not seek medical care that day. She feels fine now and has been making an effort to drink more water.   Past Medical/Surgical History: Past Medical History:  Diagnosis Date  . Depression   . No pertinent past medical history     Past Surgical History:  Procedure Laterality Date  . Adnoids    . APPENDECTOMY      Social History:  reports that she quit smoking about 1 years ago. She quit after 2.00 years of use. She has never used smokeless tobacco. She reports current alcohol use of about 1.0 standard drinks of alcohol per week. She reports that she does not use drugs.  Allergies: Allergies  Allergen Reactions  . Amoxicillin Hives  . Penicillins Hives    Has patient had a PCN reaction causing immediate rash, facial/tongue/throat swelling, SOB or lightheadedness with hypotension: Y Has patient had a PCN reaction causing severe rash involving mucus membranes or skin necrosis: Y Has patient had a PCN reaction that required hospitalization: N Has patient had a PCN reaction occurring within the last 10 years: N If all of the above answers are "NO", then may proceed with Cephalosporin use.   Alphonsa Gin [Cefixime]     Family  History:  Family History  Problem Relation Age of Onset  . Hypertension Father   . Diabetes Father   . Diabetes Mother      Current Outpatient Medications:  .  etonogestrel (NEXPLANON) 68 MG IMPL implant, Inject into the skin., Disp: , Rfl:  .  Multiple Vitamins-Minerals (WOMENS MULTIVITAMIN PO), Take by mouth., Disp: , Rfl:   Review of Systems:  Constitutional: Denies fever, chills, diaphoresis, appetite change and fatigue.  HEENT: Denies photophobia, eye pain, redness, hearing loss, ear pain, congestion, sore throat, rhinorrhea, sneezing, mouth sores, trouble swallowing, neck pain, neck stiffness and tinnitus.   Respiratory: Denies SOB, DOE, cough, chest tightness,  and wheezing.   Cardiovascular: Denies chest pain, palpitations and leg swelling.  Gastrointestinal: Denies nausea, vomiting, abdominal pain, diarrhea, constipation, blood in stool and abdominal distention.  Genitourinary: Denies dysuria, urgency, frequency, hematuria, flank pain and difficulty urinating.  Endocrine: Denies: hot or cold intolerance, sweats, changes in hair or nails, polyuria, polydipsia. Musculoskeletal: Denies myalgias, back pain, joint swelling, arthralgias and gait problem.  Skin: Denies pallor, rash and wound.  Neurological: Denies dizziness, seizures, syncope, weakness, light-headedness, numbness and headaches.  Hematological: Denies adenopathy. Easy bruising, personal or family bleeding history  Psychiatric/Behavioral: Denies suicidal ideation, mood changes, confusion, nervousness, sleep disturbance and agitation    Physical Exam: Vitals:   11/04/18 1350  BP: 102/78  Pulse: 66  Temp: 98.6 F (37 C)  TempSrc: Oral  SpO2: 99%  Weight:  131 lb 9.6 oz (59.7 kg)    Body mass index is 20.01 kg/m.   Constitutional: NAD, calm, comfortable Eyes: PERRL, lids and conjunctivae normal ENMT: Mucous membranes are moist.  Neck: firm thyromegaly and submandibular nodes are still  present. Respiratory: clear to auscultation bilaterally, no wheezing, no crackles. Normal respiratory effort. No accessory muscle use.  Cardiovascular: Regular rate and rhythm, no murmurs / rubs / gallops. No extremity edema. 2+ pedal pulses. No carotid bruits.  Psychiatric: Normal judgment and insight. Alert and oriented x 3. Normal mood.    Impression and Plan:  Syncope, unspecified syncope type  -EKG done in office today and interpreted by myself: sinus bradycardia at a rate of 55, normal axis, no ST-T changes. -Feel like no further work up is necessary at this time. -VS normal in office today. -Advised to stay hydrated.  Painful thyroiditis -Per thyroid US with sx's indicative of hyperthyroidism but normal TFTs. -Will refer to endocrine for further evaluation and management. TFTs will likely need to be monitored closely.    Patient Instructions  -Nice seeing you today!  -Endocrinology referral has been placed for your thyroid issues.  -Stay hydrated and drink 10-12 8 oz glasses of water a day to make sure your urine is a pale yellow color.       [name], MD [facility]

## 2018-11-04 NOTE — Patient Instructions (Signed)
-  Nice seeing you today!  -Endocrinology referral has been placed for your thyroid issues.  -Stay hydrated and drink 10-12 8 oz glasses of water a day to make sure your urine is a pale yellow color.

## 2018-11-09 DIAGNOSIS — L700 Acne vulgaris: Secondary | ICD-10-CM | POA: Diagnosis not present

## 2018-11-09 DIAGNOSIS — B360 Pityriasis versicolor: Secondary | ICD-10-CM | POA: Diagnosis not present

## 2018-11-09 DIAGNOSIS — Z23 Encounter for immunization: Secondary | ICD-10-CM | POA: Diagnosis not present

## 2018-11-09 DIAGNOSIS — L219 Seborrheic dermatitis, unspecified: Secondary | ICD-10-CM | POA: Diagnosis not present

## 2018-11-17 NOTE — Progress Notes (Signed)
Please sign note from [facility] 11/01/2018

## 2018-11-26 ENCOUNTER — Ambulatory Visit: Payer: BLUE CROSS/BLUE SHIELD | Admitting: Internal Medicine

## 2018-11-26 ENCOUNTER — Encounter: Payer: Self-pay | Admitting: Internal Medicine

## 2018-11-26 ENCOUNTER — Other Ambulatory Visit: Payer: Self-pay

## 2018-11-26 VITALS — BP 110/70 | HR 57 | Temp 98.4°F | Wt 131.6 lb

## 2018-11-26 VITALS — BP 118/72 | HR 72 | Temp 98.1°F | Ht 68.0 in | Wt 132.0 lb

## 2018-11-26 DIAGNOSIS — N3000 Acute cystitis without hematuria: Secondary | ICD-10-CM | POA: Diagnosis not present

## 2018-11-26 DIAGNOSIS — R309 Painful micturition, unspecified: Secondary | ICD-10-CM | POA: Diagnosis not present

## 2018-11-26 DIAGNOSIS — E010 Iodine-deficiency related diffuse (endemic) goiter: Secondary | ICD-10-CM

## 2018-11-26 LAB — URINALYSIS, ROUTINE W REFLEX MICROSCOPIC
Bilirubin Urine: NEGATIVE
Ketones, ur: NEGATIVE
Leukocytes,Ua: NEGATIVE
Nitrite: NEGATIVE
Specific Gravity, Urine: 1.01 (ref 1.000–1.030)
Total Protein, Urine: NEGATIVE
Urine Glucose: NEGATIVE
Urobilinogen, UA: 0.2 (ref 0.0–1.0)
pH: 7 (ref 5.0–8.0)

## 2018-11-26 LAB — POCT URINALYSIS DIPSTICK
Bilirubin, UA: NEGATIVE
Blood, UA: NEGATIVE
Glucose, UA: NEGATIVE
KETONES UA: NEGATIVE
Leukocytes, UA: NEGATIVE
NITRITE UA: NEGATIVE
Protein, UA: NEGATIVE
Spec Grav, UA: 1.01 (ref 1.010–1.025)
Urobilinogen, UA: 0.2 E.U./dL
pH, UA: 7 (ref 5.0–8.0)

## 2018-11-26 MED ORDER — SULFAMETHOXAZOLE-TRIMETHOPRIM 800-160 MG PO TABS: 1.0000 | ORAL_TABLET | Freq: Two times a day (BID) | ORAL | 0 refills | Status: AC

## 2018-11-26 NOTE — Patient Instructions (Signed)
-   Please stop by the lab today , we will contact you sometime next week with the results.

## 2018-11-26 NOTE — Patient Instructions (Signed)
-Hope you feel better!  -Take bactrim DS 1 tablet twice daily for 5 days.   Urinary Tract Infection, Adult A urinary tract infection (UTI) is an infection of any part of the urinary tract. The urinary tract includes:  The kidneys.  The ureters.  The bladder.  The urethra. These organs make, store, and get rid of pee (urine) in the body. What are the causes? This is caused by germs (bacteria) in your genital area. These germs grow and cause swelling (inflammation) of your urinary tract. What increases the risk? You are more likely to develop this condition if:  You have a small, thin tube (catheter) to drain pee.  You cannot control when you pee or poop (incontinence).  You are female, and: ? You use these methods to prevent pregnancy: ? A medicine that kills sperm (spermicide). ? A device that blocks sperm (diaphragm). ? You have low levels of a female hormone (estrogen). ? You are pregnant.  You have genes that add to your risk.  You are sexually active.  You take antibiotic medicines.  You have trouble peeing because of: ? A prostate that is bigger than normal, if you are female. ? A blockage in the part of your body that drains pee from the bladder (urethra). ? A kidney stone. ? A nerve condition that affects your bladder (neurogenic bladder). ? Not getting enough to drink. ? Not peeing often enough.  You have other conditions, such as: ? Diabetes. ? A weak disease-fighting system (immune system). ? Sickle cell disease. ? Gout. ? Injury of the spine. What are the signs or symptoms? Symptoms of this condition include:  Needing to pee right away (urgently).  Peeing often.  Peeing small amounts often.  Pain or burning when peeing.  Blood in the pee.  Pee that smells bad or not like normal.  Trouble peeing.  Pee that is cloudy.  Fluid coming from the vagina, if you are female.  Pain in the belly or lower back. Other symptoms include:  Throwing  up (vomiting).  No urge to eat.  Feeling mixed up (confused).  Being tired and grouchy (irritable).  A fever.  Watery poop (diarrhea). How is this treated? This condition may be treated with:  Antibiotic medicine.  Other medicines.  Drinking enough water. Follow these instructions at home:  Medicines  Take over-the-counter and prescription medicines only as told by your doctor.  If you were prescribed an antibiotic medicine, take it as told by your doctor. Do not stop taking it even if you start to feel better. General instructions  Make sure you: ? Pee until your bladder is empty. ? Do not hold pee for a long time. ? Empty your bladder after sex. ? Wipe from front to back after pooping if you are a female. Use each tissue one time when you wipe.  Drink enough fluid to keep your pee pale yellow.  Keep all follow-up visits as told by your doctor. This is important. Contact a doctor if:  You do not get better after 1-2 days.  Your symptoms go away and then come back. Get help right away if:  You have very bad back pain.  You have very bad pain in your lower belly.  You have a fever.  You are sick to your stomach (nauseous).  You are throwing up. Summary  A urinary tract infection (UTI) is an infection of any part of the urinary tract.  This condition is caused by germs in your  genital area.  There are many risk factors for a UTI. These include having a small, thin tube to drain pee and not being able to control when you pee or poop.  Treatment includes antibiotic medicines for germs.  Drink enough fluid to keep your pee pale yellow. This information is not intended to replace advice given to you by your health care provider. Make sure you discuss any questions you have with your health care provider. Document Released: 02/11/2008 Document Revised: 03/04/2018 Document Reviewed: 03/04/2018 Elsevier Interactive Patient Education  2019 [employer].

## 2018-11-26 NOTE — Progress Notes (Signed)
Established Patient Office Visit     CC/Reason for Visit: Dysuria  HPI: Donna Leach is a 28 y.o. female who is coming in today for the above mentioned reasons. Increased urinary frequency for past 3 days, with some burning, No fevers, chills, n/v, abdominal pain.   Past Medical/Surgical History: Past Medical History:  Diagnosis Date  . Depression   . No pertinent past medical history     Past Surgical History:  Procedure Laterality Date  . Adnoids    . APPENDECTOMY      Social History:  reports that she quit smoking about 2 years ago. She quit after 2.00 years of use. She has never used smokeless tobacco. She reports current alcohol use of about 1.0 standard drinks of alcohol per week. She reports that she does not use drugs.  Allergies: Allergies  Allergen Reactions  . Amoxicillin Hives  . Penicillins Hives    Has patient had a PCN reaction causing immediate rash, facial/tongue/throat swelling, SOB or lightheadedness with hypotension: Y Has patient had a PCN reaction causing severe rash involving mucus membranes or skin necrosis: Y Has patient had a PCN reaction that required hospitalization: N Has patient had a PCN reaction occurring within the last 10 years: N If all of the above answers are "NO", then may proceed with Cephalosporin use.   Alphonsa Gin [Cefixime]     Family History:  Family History  Problem Relation Age of Onset  . Hypertension Father   . Diabetes Father   . Diabetes Mother   . Hyperthyroidism Other      Current Outpatient Medications:  .  etonogestrel (NEXPLANON) 68 MG IMPL implant, Inject into the skin., Disp: , Rfl:  .  fluconazole (DIFLUCAN) 200 MG tablet, TAKE 2 TABLETS BY MOUTH TODAY AND THEN TAKE TWO TABLETS BY MOUTH IN ONE WEEK, Disp: , Rfl:  .  Multiple Vitamins-Minerals (WOMENS MULTIVITAMIN PO), Take by mouth., Disp: , Rfl:  .  sulfamethoxazole-trimethoprim (BACTRIM DS,SEPTRA DS) 800-160 MG tablet, Take 1 tablet by  mouth 2 (two) times daily for 5 days., Disp: 10 tablet, Rfl: 0  Review of Systems:  Constitutional: Denies fever, chills, diaphoresis, appetite change and fatigue.  HEENT: Denies photophobia, eye pain, redness, hearing loss, ear pain, congestion, sore throat, rhinorrhea, sneezing, mouth sores, trouble swallowing, neck pain, neck stiffness and tinnitus.   Respiratory: Denies SOB, DOE, cough, chest tightness,  and wheezing.   Cardiovascular: Denies chest pain, palpitations and leg swelling.  Gastrointestinal: Denies nausea, vomiting, abdominal pain, diarrhea, constipation, blood in stool and abdominal distention.  Genitourinary: Denies , flank pain and difficulty urinating.  Endocrine: Denies: hot or cold intolerance, sweats, changes in hair or nails, polyuria, polydipsia. Musculoskeletal: Denies myalgias, back pain, joint swelling, arthralgias and gait problem.  Skin: Denies pallor, rash and wound.  Neurological: Denies dizziness, seizures, syncope, weakness, light-headedness, numbness and headaches.  Hematological: Denies adenopathy. Easy bruising, personal or family bleeding history  Psychiatric/Behavioral: Denies suicidal ideation, mood changes, confusion, nervousness, sleep disturbance and agitation    Physical Exam: Vitals:   11/26/18 1504  BP: 110/70  Pulse: (!) 57  Temp: 98.4 F (36.9 C)  TempSrc: Oral  SpO2: 97%  Weight: 131 lb 9.6 oz (59.7 kg)    Body mass index is 20.01 kg/m.   Constitutional: NAD, calm, comfortable Eyes: PERRL, lids and conjunctivae normal ENMT: Mucous membranes are moist.  Abdomen: no tenderness, no masses palpated. No hepatosplenomegaly. Bowel sounds positive.  Musculoskeletal: no clubbing / cyanosis. No joint  deformity upper and lower extremities. Good ROM, no contractures. Normal muscle tone.    Impression and Plan:  Painful urination  Acute cystitis without hematuria   -Dipstick with leukocytes. -Will treat empirically with Bactrim DS for  5 days. -UA and culture sent.   Patient Instructions  -Hope you feel better!  -Take bactrim DS 1 tablet twice daily for 5 days.   Urinary Tract Infection, Adult A urinary tract infection (UTI) is an infection of any part of the urinary tract. The urinary tract includes:  The kidneys.  The ureters.  The bladder.  The urethra. These organs make, store, and get rid of pee (urine) in the body. What are the causes? This is caused by germs (bacteria) in your genital area. These germs grow and cause swelling (inflammation) of your urinary tract. What increases the risk? You are more likely to develop this condition if:  You have a small, thin tube (catheter) to drain pee.  You cannot control when you pee or poop (incontinence).  You are female, and: ? You use these methods to prevent pregnancy: ? A medicine that kills sperm (spermicide). ? A device that blocks sperm (diaphragm). ? You have low levels of a female hormone (estrogen). ? You are pregnant.  You have genes that add to your risk.  You are sexually active.  You take antibiotic medicines.  You have trouble peeing because of: ? A prostate that is bigger than normal, if you are female. ? A blockage in the part of your body that drains pee from the bladder (urethra). ? A kidney stone. ? A nerve condition that affects your bladder (neurogenic bladder). ? Not getting enough to drink. ? Not peeing often enough.  You have other conditions, such as: ? Diabetes. ? A weak disease-fighting system (immune system). ? Sickle cell disease. ? Gout. ? Injury of the spine. What are the signs or symptoms? Symptoms of this condition include:  Needing to pee right away (urgently).  Peeing often.  Peeing small amounts often.  Pain or burning when peeing.  Blood in the pee.  Pee that smells bad or not like normal.  Trouble peeing.  Pee that is cloudy.  Fluid coming from the vagina, if you are female.  Pain in the  belly or lower back. Other symptoms include:  Throwing up (vomiting).  No urge to eat.  Feeling mixed up (confused).  Being tired and grouchy (irritable).  A fever.  Watery poop (diarrhea). How is this treated? This condition may be treated with:  Antibiotic medicine.  Other medicines.  Drinking enough water. Follow these instructions at home:  Medicines  Take over-the-counter and prescription medicines only as told by your doctor.  If you were prescribed an antibiotic medicine, take it as told by your doctor. Do not stop taking it even if you start to feel better. General instructions  Make sure you: ? Pee until your bladder is empty. ? Do not hold pee for a long time. ? Empty your bladder after sex. ? Wipe from front to back after pooping if you are a female. Use each tissue one time when you wipe.  Drink enough fluid to keep your pee pale yellow.  Keep all follow-up visits as told by your doctor. This is important. Contact a doctor if:  You do not get better after 1-2 days.  Your symptoms go away and then come back. Get help right away if:  You have very bad back pain.  You have very  bad pain in your lower belly.  You have a fever.  You are sick to your stomach (nauseous).  You are throwing up. Summary  A urinary tract infection (UTI) is an infection of any part of the urinary tract.  This condition is caused by germs in your genital area.  There are many risk factors for a UTI. These include having a small, thin tube to drain pee and not being able to control when you pee or poop.  Treatment includes antibiotic medicines for germs.  Drink enough fluid to keep your pee pale yellow. This information is not intended to replace advice given to you by your health care provider. Make sure you discuss any questions you have with your health care provider. Document Released: 02/11/2008 Document Revised: 03/04/2018 Document Reviewed: 03/04/2018  Elsevier Interactive Patient Education  2019 Elsevier Inc.      [name], MD [facility]

## 2018-11-26 NOTE — Progress Notes (Signed)
Name: Donna Leach  MRN/ DOB: [id number], 1991-06-04    Age/ Sex: 28 y.o., female    PCP: Donna Leach, Donna Patricia, MD   Reason for Endocrinology Evaluation: Abnormal thyroid ultrasound      Date of Initial Endocrinology Evaluation: 11/26/2018     HPI: [name] is a 28 y.o. female with unremarkable past medical history. The patient presented for initial endocrinology clinic visit on 11/26/2018 for consultative assistance with her thyromegaly.   Patient presented to her PCP with concerns of neck and lymph node enlargement in February . The swelling has been intermittent. Pt states she has had a long intermittent history of lymphadenopathy.   She did have multiple symptoms in February but she feels better now.   She rarely has heartburn.  She denies weight changes, palpitations, or constipation.  She has maternal aunt with hyperthyroidism.      HISTORY:  Past Medical History:  Past Medical History:  Diagnosis Date  . Depression   . No pertinent past medical history    Past Surgical History:  Past Surgical History:  Procedure Laterality Date  . Adnoids    . APPENDECTOMY        Social History:  reports that she quit smoking about 2 years ago. She quit after 2.00 years of use. She has never used smokeless tobacco. She reports current alcohol use of about 1.0 standard drinks of alcohol per week. She reports that she does not use drugs.  Family History: family history includes Diabetes in her father and mother; Hypertension in her father.   HOME MEDICATIONS: Allergies as of 11/26/2018      Reactions   Amoxicillin Hives   Penicillins Hives   Has patient had a PCN reaction causing immediate rash, facial/tongue/throat swelling, SOB or lightheadedness with hypotension: Y Has patient had a PCN reaction causing severe rash involving mucus membranes or skin necrosis: Y Has patient had a PCN reaction that required hospitalization: N Has patient had a  PCN reaction occurring within the last 10 years: N If all of the above answers are "NO", then may proceed with Cephalosporin use.   Suprax [cefixime]       Medication List       Accurate as of November 26, 2018  1:48 PM. Always use your most recent med list.        etonogestrel 68 MG Impl implant Commonly known as:  NEXPLANON Inject into the skin.   fluconazole 200 MG tablet Commonly known as:  DIFLUCAN TAKE 2 TABLETS BY MOUTH TODAY AND THEN TAKE TWO TABLETS BY MOUTH IN ONE WEEK   WOMENS MULTIVITAMIN PO Take by mouth.         REVIEW OF SYSTEMS: A comprehensive ROS was conducted with the patient and is negative except as per HPI and below:  Review of Systems  Constitutional: Positive for malaise/fatigue. Negative for weight loss.  HENT: Negative for congestion.   Respiratory: Negative for cough and shortness of breath.   Gastrointestinal: Negative for diarrhea and nausea.  Genitourinary: Positive for frequency.  Neurological: Positive for tremors. Negative for tingling.  Endo/Heme/Allergies: Negative for polydipsia.  Psychiatric/Behavioral: Negative for depression. The patient is nervous/anxious.        OBJECTIVE:  VS: BP 118/72 (BP Location: Left Arm, Patient Position: Sitting, Cuff Size: Normal)   Pulse 72   Temp 98.1 F (36.7 C)   Ht 5\' 8"  (1.727 m)   Wt 132 lb (59.9 kg)   SpO2 96%  BMI 20.07 kg/m    Wt Readings from Last 3 Encounters:  11/26/18 132 lb (59.9 kg)  11/04/18 131 lb 9.6 oz (59.7 kg)  10/29/18 133 lb 3.2 oz (60.4 kg)     EXAM: General: Pt appears well and is in NAD  Hydration: Well-hydrated with moist mucous membranes and good skin turgor  Eyes: External eye exam normal without stare, lid lag or exophthalmos.  EOM intact.   Ears, Nose, Throat: Hearing: Grossly intact bilaterally Dental: Good dentition  Throat: Clear without mass, erythema or exudate  Neck: General: Supple without adenopathy. Thyroid: Thyroid size normal.  No goiter or  nodules appreciated. No thyroid bruit.  Lungs: Clear with good BS bilat with no rales, rhonchi, or wheezes  Heart: Auscultation: RRR.  Abdomen: Normoactive bowel sounds, soft, nontender, without masses or organomegaly palpable  Extremities: BL UE: Normal ROM and strength. BL LE: No pretibial edema normal ROM and strength.  Skin: Hair: Texture and amount normal with gender appropriate distribution Skin Inspection: No rashes. Skin Palpation: Skin temperature, texture, and thickness normal to palpation  Neuro: Cranial nerves: II - XII grossly intact  Motor: Normal strength throughout DTRs: 2+ and symmetric in UE without delay in relaxation phase  Mental Status: Judgment, insight: Intact Orientation: Oriented to time, place, and person Mood and affect: No depression, anxiety, or agitation     DATA REVIEWED:   Results for ANETT, RANKER (MRN [id number]) as of 11/26/2018 13:47  Ref. Range 10/29/2018 10:49  TSH Latest Ref Range: 0.35 - 4.50 uIU/mL 2.52  Triiodothyronine,Free,Serum Latest Ref Range: 2.3 - 4.2 pg/mL 2.9  T4,Free(Direct) Latest Ref Range: 0.60 - 1.60 ng/dL 4.09    Thyroid [id number]:5/62/1308 Parenchymal Echotexture: Mildly heterogenous - potential mild glandular hyperemia (images 9  Isthmus: Normal in size measures 0.2 cm in diameter  Right lobe: Slightly atrophic measuring 4.3 x 1.3 x 1.8 cm  Left lobe: There is slightly atrophic measuring 4.0 x 0.8 x 1.6 cm  _________________________________________________________  Estimated total number of nodules >/= 1 cm: 0  Number of spongiform nodules >/=  2 cm not described below (TR1): 0  Number of mixed cystic and solid nodules >/= 1.5 cm not described below (TR2): 0  _________________________________________________________  Note is made of a punctate (approximately 0.3 cm) cyst within the mid/inferior pole of the left lobe of the thyroid which contains an internal echogenic foci with ring down  artifact compatible with benign colloid. This benign colloid containing cyst does not meet imaging criteria to recommend percutaneous sampling or continued dedicated follow-up.  IMPRESSION: Slightly atrophic, mildly heterogeneous and potentially hyperemic thyroid gland without discrete worrisome nodule or mass. Findings are nonspecific though could be seen in the setting of a thyroiditis. Clinical correlation is advised.   Results for EZRA, MARQUESS (MRN [id number]) as of 11/29/2018 15:33  Ref. Range 11/26/2018 14:15  Thyroperoxidase Ab SerPl-aCnc Latest Ref Range: <9 IU/mL 1   Results for TYAISHA, CULLOM (MRN [id number]) as of 11/29/2018 15:33  Ref. Range 11/26/2018 14:15  TRAB Latest Ref Range: <=2.00 IU/L <1.00    ASSESSMENT/PLAN/RECOMMENDATIONS:   1. Thyromegaly   -  She is clinically and biochemically euthyroid  - Her ultrasound shows non-specific findings of thyroiditis, Anti-TPO Ab's and TRAb antibodies are negative. This could have to do with a more generalized inflammatory condition that she was suffering from at the time, the patient does recall having painful lymphadenopathy at the time.  - At this time, no intervention is needed as her thyroid function is  normal, and a colloid cyst does not require any serial ultrasound as they are benign.    F/u in 1 yr  Signed electronically by: Lyndle Herrlich, MD  [facility] Endocrinology  [facility] [address] [place], [place] [address] Phone: [phone] FAX: [phone]   CC: Donna Leach, Donna Patricia, MD [address] [place] [address] Phone: [phone] Fax: [phone]   Return to Endocrinology clinic as below: Future Appointments  Date Time Provider Department Center  11/26/2018  3:00 PM Donna Leach, Donna Patricia, MD [facility]  01/27/2019 10:00 AM Donna Leach, Donna Patricia, MD [facility]

## 2018-11-27 LAB — URINE CULTURE
MICRO NUMBER:: 341133
Result:: NO GROWTH
SPECIMEN QUALITY:: ADEQUATE

## 2018-11-29 LAB — THYROID PEROXIDASE ANTIBODIES (TPO) (REFL): Thyroperoxidase Ab SerPl-aCnc: 1 IU/mL (ref <9)

## 2018-11-29 LAB — TRAB (TSH RECEPTOR BINDING ANTIBODY): TRAB: <1 IU/L (ref <=2.00)

## 2018-12-09 ENCOUNTER — Ambulatory Visit: Payer: BLUE CROSS/BLUE SHIELD | Admitting: Internal Medicine

## 2018-12-10 ENCOUNTER — Ambulatory Visit (INDEPENDENT_AMBULATORY_CARE_PROVIDER_SITE_OTHER): Payer: BLUE CROSS/BLUE SHIELD | Admitting: Internal Medicine

## 2018-12-10 ENCOUNTER — Other Ambulatory Visit: Payer: Self-pay

## 2018-12-10 DIAGNOSIS — L509 Urticaria, unspecified: Secondary | ICD-10-CM

## 2018-12-10 NOTE — Progress Notes (Signed)
Virtual Visit via Video Note  I connected with Donna Leach on 12/10/18 at 11:30 AM EDT by a video enabled telemedicine application and verified that I am speaking with the correct person using two identifiers.  Location patient: home Location provider: work office Persons participating in the virtual visit: patient, provider  I discussed the limitations of evaluation and management by telemedicine and the availability of in person appointments. The patient expressed understanding and agreed to proceed.   HPI: She scheduled this visit due to hives. They are no longer present. She works at [employer]. 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 She also just moved in with a friend who has dogs and a long-haired cat. With the move she has also been using new body care products and laundry detergent. She took a benadryl and the hives subsided. No difficulty breathing.   ROS: Constitutional: Denies fever, chills, diaphoresis, appetite change and fatigue.  HEENT: Denies photophobia, eye pain, redness, hearing loss, ear pain, congestion, sore throat, rhinorrhea, sneezing, mouth sores, trouble swallowing, neck pain, neck stiffness and tinnitus.   Respiratory: Denies SOB, DOE, cough, chest tightness,  and wheezing.   Cardiovascular: Denies chest pain, palpitations and leg swelling.  Gastrointestinal: Denies nausea, vomiting, abdominal pain, diarrhea, constipation, blood in stool and abdominal distention.  Genitourinary: Denies dysuria, urgency, frequency, hematuria, flank pain and difficulty urinating.  Endocrine: Denies: hot or cold intolerance, sweats, changes in hair or nails, polyuria, polydipsia. Musculoskeletal: Denies myalgias, back pain, joint swelling, arthralgias and gait problem.  Skin: Denies pallor, rash and wound.  Neurological: Denies dizziness, seizures, syncope, weakness, light-headedness, numbness and headaches.  Hematological: Denies  adenopathy. Easy bruising, personal or family bleeding history  Psychiatric/Behavioral: Denies suicidal ideation, mood changes, confusion, nervousness, sleep disturbance and agitation   Past Medical History:  Diagnosis Date  . Depression   . No pertinent past medical history     Past Surgical History:  Procedure Laterality Date  . Adnoids    . APPENDECTOMY      Family History  Problem Relation Age of Onset  . Hypertension Father   . Diabetes Father   . Diabetes Mother   . Hyperthyroidism Other     SOCIAL HX:   reports that she quit smoking about 2 years ago. She quit after 2.00 years of use. She has never used smokeless tobacco. She reports current alcohol use of about 1.0 standard drinks of alcohol per week. She reports that she does not use drugs.   Current Outpatient Medications:  .  etonogestrel (NEXPLANON) 68 MG IMPL implant, Inject into the skin., Disp: , Rfl:  .  fluconazole (DIFLUCAN) 200 MG tablet, TAKE 2 TABLETS BY MOUTH TODAY AND THEN TAKE TWO TABLETS BY MOUTH IN ONE WEEK, Disp: , Rfl:  .  Multiple Vitamins-Minerals (WOMENS MULTIVITAMIN PO), Take by mouth., Disp: , Rfl:   EXAM:   VITALS per patient if applicable: none reported  GENERAL: alert, oriented, appears well and in no acute distress  HEENT: atraumatic, conjunttiva clear, no obvious abnormalities on inspection of external nose and ears  NECK: normal movements of the head and neck  LUNGS: on inspection no signs of respiratory distress, breathing rate appears normal, no obvious gross increased work of breathing, gasping or wheezing  CV: no obvious cyanosis  MS: moves all visible extremities without noticeable abnormality  PSYCH/NEURO: pleasant and cooperative, no obvious depression or anxiety, speech and thought processing grossly intact  ASSESSMENT AND PLAN:   Hives -  Unclear etiology, lots of new exposures. -Advised observation for now, benadryl at night and claritin/zyrtec during daytime PRN.  -She will notify me if continues to have issues with hives and we can consider an allergist referral at that point.    I discussed the assessment and treatment plan with the patient. The patient was provided an opportunity to ask questions and all were answered. The patient agreed with the plan and demonstrated an understanding of the instructions.   The patient was advised to call back or seek an in-person evaluation if the symptoms worsen or if the condition fails to improve as anticipated.    [name], MD  [facility] at [facility]

## 2018-12-22 ENCOUNTER — Telehealth: Payer: BLUE CROSS/BLUE SHIELD | Admitting: Nurse Practitioner

## 2018-12-22 DIAGNOSIS — L719 Rosacea, unspecified: Secondary | ICD-10-CM | POA: Diagnosis not present

## 2018-12-22 MED ORDER — METRONIDAZOLE 1 % EX CREA: TOPICAL_CREAM | Freq: Every day | CUTANEOUS | 0 refills | Status: DC

## 2018-12-22 NOTE — Progress Notes (Signed)
E visit for Rosacea We are sorry that you are not feeling well. Here is how we plan to help! Based on what you shared with me it looks like you have Rosacea.  Rosacea is a common chronic skin condition that usually only affects the face and eyes.  Occasionally, the neck, chest, or other areas may be involved.  Characterized by redness, pimples, and broken blood vessels, rosacea tends to begin after middle age (between the ages of 30 and 60).  It is more common in fair-skinned people and women in menopause. It may appear differently in dark skinned people but rosacea effects all ethnic groups.  The cause of rosacea is not fully understood. We do know that rosacea is worsened by various trigger factors including, spicy or hot foods, hot beverages such as coffee or tea, alcohol, and sun exposure just to name a few.  Signs of rosacea may vary greatly from person to person. In some individuals it may only flare up from time to time.   I have prescribed: A topical antibiotic called Metronidazole 1%.  Apply a thin film to affected area once daily.  Wash your hands before and after use. Make sure your skin is clean and dry.  Rub in gently and completely over the affected areas.   HOME CARE: . Keep a record of triggers, such as stress, weather, or certain foods or drinks. Consider limiting hot or spicy foods and alcohol. . Always use sunscreen that protects against UVA and UVB rays and has a sun-protecting factor (SPF) of 15 or higher. . Avoid putting steroids on the skin sores. Steroids may make rosacea worse.  . You may use small amounts of water based cosmetic while using this medication.  Apply cosmetics after cream has dried. . If you shave your face, use an electric razor . Don't scrub your skin or use sponges, brushes, or other abrasive tools. Doing so can irritate your skin. GET HELP RIGHT AWAY IF: . If your rosacea gets worse or is not better within 4 weeks. . If a new skin condition or rash  develops. . Loss of feeling or tingling of treated area . Nausea  MAKE SURE YOU    Understand these instructions.  Will watch your condition.  Will get help right away if you are not doing well or get worse.  Your e-visit answers were reviewed by a board certified advanced clinical practitioner to complete your personal care plan. Depending upon the condition, your plan could have included both over the counter or prescription medications. Please review your pharmacy choice. Be sure that the pharmacy you have chosen is open so that you can pick up your prescription now.  If there is a problem you may message your provider in MyChart to have the prescription routed to another pharmacy. Your safety is important to us. If you have drug allergies check your prescription carefully.  For the next 24 hours, you can use MyChart to ask questions about today's visit, request a non-urgent call back, or ask for a work or school excuse from your e-visit provider. You will get an email in the next two days asking about your experience. I hope that your e-visit has been valuable and will speed your recovery.  5 minutes spent reviewing and documenting in chart.   

## 2018-12-24 ENCOUNTER — Telehealth: Payer: Self-pay

## 2018-12-24 ENCOUNTER — Other Ambulatory Visit: Payer: Self-pay | Admitting: Nurse Practitioner

## 2018-12-24 NOTE — Progress Notes (Signed)
Error

## 2018-12-24 NOTE — Telephone Encounter (Signed)
Spoke with mother this afternoon while trying to get her own appt scheduled-she then informed me of her daughter who no longer has a PCP and is needing to be seen. She states she has been having heart palpitations and feels like she "has three hearts". Her glands in her neck have become swollen and the whites in her eyes have become blue. She states she is a big coffee drinker but has cut back since noticing these palpations. I have informed her that I can get her in with one of the doctors in our office but I think it is best her daughter is evaluated at an urgent care or ER for her heat symptoms. I explained that at this time we are trying to see as less patients as possible in person due to amounts of PPE in office. She understood and will inform her daughter to go to ER to be evaluated for palpitations. I informed her in the mean time I will send this message to Dr. Patsy Lager to advise on taking on patient's daughter or if she should be seen in office?

## 2018-12-24 NOTE — Telephone Encounter (Signed)
No im sorry-this is the patient-she is 28. I spoke with this patient's mother another patient of yours Santina Evans Susi you are seeing on Monday while trying to get her own appointment scheduled). I will notifiy her that she should indeed still go to ED and we can follow up and establish with you next week. You want to do a virtual visit?

## 2018-12-24 NOTE — Telephone Encounter (Signed)
I can see her daughter as a pt (any idea how old she is? ) but agree that for acute sx she needs to be seen in the ER for acute sx.  We can see her in the office next week for follow-up

## 2018-12-24 NOTE — Telephone Encounter (Signed)
We would really need an in-person visit for this issue as long as she is well

## 2018-12-27 NOTE — Telephone Encounter (Signed)
Tried calling patient, no answer.. Patient mother has appointment today with you-could you ask her if she can get her daughter to call me back?

## 2018-12-27 NOTE — Telephone Encounter (Signed)
Yes, asked her to have daughter call,  She states agreement

## 2018-12-29 ENCOUNTER — Ambulatory Visit: Payer: BLUE CROSS/BLUE SHIELD | Admitting: Internal Medicine

## 2019-01-03 ENCOUNTER — Ambulatory Visit: Payer: Self-pay | Admitting: *Deleted

## 2019-01-03 NOTE — Telephone Encounter (Signed)
Pt called with having palpitations. She described her heart beat as having extra beats and feeling like her heart is enlarged.  She was calling to see if she could come in this afternoon instead of tomorrow because she is feeling worst. Her pulse right now is 89 and that is with her now moving around, just sitting. Stated every time she moves she feels it getting faster.  Advised her that the office closed at 4 pm. And also wanted to know if her appointment could be moved up to the morning. Advised that she could go to an urgent care or ED. But she would need to call and see how they are taking patients.  While talking with her, she stated that her felt pressure on her heart. That she just did not feel good. States some shortness of breath also. Advised her to call 911. She is home alone at this time. Pt voiced understanding. Routing to flow at [facility] for review.  Reason for Disposition . Patient sounds very sick or weak to the triager  Answer Assessment - Initial Assessment Questions 1. DESCRIPTION: "Please describe your heart rate or heart beat that you are having" (e.g., fast/slow, regular/irregular, skipped or extra beats, "palpitations")     Combination of extra beats if exercise too hard, sleeps on the left side feels like hand goes to sleep and now feels like her heart is in enlarged 2. ONSET: "When did it start?" (Minutes, hours or days)      Current symptom 24 hours 3. DURATION: "How long does it last" (e.g., seconds, minutes, hours)     With movement feels like her heart is over working 4. PATTERN "Does it come and go, or has it been constant since it started?"  "Does it get worse with exertion?"   "Are you feeling it now?"     Worse with exertion  5. TAP: "Using your hand, can you tap out what you are feeling on a chair or table in front of you, so that I can hear?" (Note: not all patients can do this)       n/a 6. HEART RATE: "Can you tell me your heart rate?" "How many  beats in 15 seconds?"  (Note: not all patients can do this)       Used her phone to count her heart rate 89 7. RECURRENT SYMPTOM: "Have you ever had this before?" If so, ask: "When was the last time?" and "What happened that time?"      Yes but feels like it is getting worst now 8. CAUSE: "What do you think is causing the palpitations?"     Not sure 9. CARDIAC HISTORY: "Do you have any history of heart disease?" (e.g., heart attack, angina, bypass surgery, angioplasty, arrhythmia)      no 10. OTHER SYMPTOMS: "Do you have any other symptoms?" (e.g., dizziness, chest pain, sweating, difficulty breathing)       Short of breath  11. PREGNANCY: "Is there any chance you are pregnant?" "When was your last menstrual period? Not pregnant. LMP last week  Protocols used: HEART RATE AND HEARTBEAT QUESTIONS-A-AH

## 2019-01-04 ENCOUNTER — Ambulatory Visit: Payer: BLUE CROSS/BLUE SHIELD | Admitting: Family Medicine

## 2019-01-04 ENCOUNTER — Ambulatory Visit: Payer: BLUE CROSS/BLUE SHIELD | Admitting: Internal Medicine

## 2019-01-04 ENCOUNTER — Other Ambulatory Visit: Payer: Self-pay

## 2019-01-04 ENCOUNTER — Encounter: Payer: Self-pay | Admitting: Family Medicine

## 2019-01-04 VITALS — BP 96/60 | HR 82 | Temp 98.1°F | Ht 68.0 in | Wt 128.4 lb

## 2019-01-04 DIAGNOSIS — E538 Deficiency of other specified B group vitamins: Secondary | ICD-10-CM

## 2019-01-04 DIAGNOSIS — R002 Palpitations: Secondary | ICD-10-CM

## 2019-01-04 DIAGNOSIS — R42 Dizziness and giddiness: Secondary | ICD-10-CM

## 2019-01-04 DIAGNOSIS — R202 Paresthesia of skin: Secondary | ICD-10-CM | POA: Diagnosis not present

## 2019-01-04 LAB — VITAMIN B12: Vitamin B-12: 432 pg/mL (ref 211–911)

## 2019-01-04 NOTE — Telephone Encounter (Signed)
Do we know what happened? She doesn't appear to have gone to a  [facility] ED or UC.Marland KitchenMarland KitchenMarland Kitchen

## 2019-01-04 NOTE — Progress Notes (Signed)
Subjective:     Patient ID: Donna Leach, female   DOB: 01/19/1991, 28 y.o.   MRN: [id number]  HPI Patient was seen in office today to discuss several concerns as follows  She has had some lightheadedness and dizziness when she goes from sitting to standing.  Symptoms are somewhat inconsistent.  She did have syncopal episode several months ago had EKG at that point that was unremarkable.  She tries to stay well-hydrated.  Blood pressure tends to run on the low side.  She had some vague chest symptoms.  Sometimes feels increased sense of heartbeat and palpitation and occasional pressure sensation in her chest but not exertional.  No fever or cough.  Appetite and weight are stable  Somewhat low B12 level of 270 couple months ago.  Patient has been on oral replacement daily since then.  She is requesting repeat level.  She is had some recent numbness involving her left upper extremity mostly this sounds more positional.  This occurs more at night and use after she changes position symptoms.  No lower extremity symptoms.  No headache.  No visual changes.  No weakness.  No neck pain.  She works at [employer].  She has been off the past couple weeks.  She has had some anxiety regarding recent pandemic but overall denies any other specific stressors  Past Medical History:  Diagnosis Date  . Depression   . No pertinent past medical history    Past Surgical History:  Procedure Laterality Date  . Adnoids    . APPENDECTOMY      reports that she quit smoking about 2 years ago. She quit after 2.00 years of use. She has never used smokeless tobacco. She reports current alcohol use of about 1.0 standard drinks of alcohol per week. She reports that she does not use drugs. family history includes Diabetes in her father and mother; Hypertension in her father; Hyperthyroidism in an other family member. Allergies  Allergen Reactions  . Amoxicillin Hives  . Penicillins Hives    Has patient had a PCN  reaction causing immediate rash, facial/tongue/throat swelling, SOB or lightheadedness with hypotension: Y Has patient had a PCN reaction causing severe rash involving mucus membranes or skin necrosis: Y Has patient had a PCN reaction that required hospitalization: N Has patient had a PCN reaction occurring within the last 10 years: N If all of the above answers are "NO", then may proceed with Cephalosporin use.   Alphonsa Gin. Suprax [Cefixime]      Review of Systems  Constitutional: Negative for appetite change, chills, fever and unexpected weight change.  Respiratory: Negative for shortness of breath and wheezing.   Cardiovascular: Positive for palpitations. Negative for leg swelling.  Gastrointestinal: Negative for abdominal pain.  Neurological: Positive for dizziness, light-headedness and numbness. Negative for tremors, seizures, syncope, speech difficulty, weakness and headaches.       Objective:   Physical Exam Constitutional:      Appearance: Normal appearance.  Neck:     Musculoskeletal: Normal range of motion and neck supple.  Cardiovascular:     Rate and Rhythm: Normal rate and regular rhythm.     Pulses: Normal pulses.     Heart sounds: No murmur.  Pulmonary:     Effort: Pulmonary effort is normal.     Breath sounds: Normal breath sounds. No wheezing or rales.  Musculoskeletal:     Right lower leg: No edema.     Left lower leg: No edema.  Lymphadenopathy:  Cervical: No cervical adenopathy.  Neurological:     Mental Status: She is alert.     Comments: Full strength upper extremities.  No muscle atrophy.  Symmetric reflexes.  Normal sensory function to touch throughout        Assessment:     #1 intermittent palpitations.  She had recent EKG that was unremarkable.  Question PACs or PVCs Heart exam normal.  Normal rhythm and no murmurs  #2 intermittent paresthesias upper extremities.  This appears to be more positional.  Nonfocal neuro exam.  Her symptoms are very  transient and resolved with change of position  #3 history of low normal B12 level.  Patient does not have any specific risk factors such as diet, gastric surgery, or any chronic GI issues  #4 recent lightheadedness.  Blood pressure today seated 92/60 and standing 96/60    Plan:     -Reassurance regarding what appears to be more positional numbness involving upper extremity -Discussed importance of staying well-hydrated and changing positions slowly -Continue to minimize caffeine intake -Recheck B12 level -Continue close follow-up with primary for any persistent or worsening symptoms -Follow-up immediately for any syncope, exertional dizziness, or other concerns  Kristian Covey MD [facility] at [facility]

## 2019-01-04 NOTE — Telephone Encounter (Signed)
Spoke with patient and an appointment scheduled with Dr Caryl Never 11:15

## 2019-01-04 NOTE — Telephone Encounter (Signed)
Left message on machine for patient to return our call CRM 

## 2019-01-27 ENCOUNTER — Other Ambulatory Visit: Payer: Self-pay

## 2019-01-27 ENCOUNTER — Ambulatory Visit: Payer: BLUE CROSS/BLUE SHIELD | Admitting: Internal Medicine

## 2019-02-18 ENCOUNTER — Ambulatory Visit (INDEPENDENT_AMBULATORY_CARE_PROVIDER_SITE_OTHER): Payer: BC Managed Care – PPO | Admitting: Internal Medicine

## 2019-02-18 ENCOUNTER — Other Ambulatory Visit: Payer: Self-pay

## 2019-02-18 DIAGNOSIS — N6310 Unspecified lump in the right breast, unspecified quadrant: Secondary | ICD-10-CM | POA: Diagnosis not present

## 2019-02-18 NOTE — Progress Notes (Signed)
Virtual Visit via Video Note  I connected with Donna Leach on 02/18/19 at  3:30 PM EDT by a video enabled telemedicine application and verified that I am speaking with the correct person using two identifiers.  Location patient: home Location provider: work office Persons participating in the virtual visit: patient, provider  I discussed the limitations of evaluation and management by telemedicine and the availability of in person appointments. The patient expressed understanding and agreed to proceed.   HPI: Donna Leach has scheduled this visit to discuss a lump she has found in her right breast. She first noticed it about 1 week ago. It has not changed in size with her cycles. She does not have a personal or a family history of breast cancer. She is 28 years old.  She has moved to [place]. She feels like she is doing much better as there is much less stress now in her environment. She had a slight sore throat 2 days ago but this resolved. No other symptoms.   ROS: Constitutional: Denies fever, chills, diaphoresis, appetite change and fatigue.  HEENT: Denies photophobia, eye pain, redness, hearing loss, ear pain, congestion, sore throat, rhinorrhea, sneezing, mouth sores, trouble swallowing, neck pain, neck stiffness and tinnitus.   Respiratory: Denies SOB, DOE, cough, chest tightness,  and wheezing.   Cardiovascular: Denies chest pain, palpitations and leg swelling.  Gastrointestinal: Denies nausea, vomiting, abdominal pain, diarrhea, constipation, blood in stool and abdominal distention.  Genitourinary: Denies dysuria, urgency, frequency, hematuria, flank pain and difficulty urinating.  Endocrine: Denies: hot or cold intolerance, sweats, changes in hair or nails, polyuria, polydipsia. Musculoskeletal: Denies myalgias, back pain, joint swelling, arthralgias and gait problem.  Skin: Denies pallor, rash and wound.  Neurological: Denies dizziness, seizures, syncope, weakness,  light-headedness, numbness and headaches.  Hematological: Denies adenopathy. Easy bruising, personal or family bleeding history  Psychiatric/Behavioral: Denies suicidal ideation, mood changes, confusion, nervousness, sleep disturbance and agitation   Past Medical History:  Diagnosis Date  . Depression   . No pertinent past medical history     Past Surgical History:  Procedure Laterality Date  . Adnoids    . APPENDECTOMY      Family History  Problem Relation Age of Onset  . Hypertension Father   . Diabetes Father   . Diabetes Mother   . Hyperthyroidism Other     SOCIAL HX:   reports that she quit smoking about 2 years ago. She quit after 2.00 years of use. She has never used smokeless tobacco. She reports current alcohol use of about 1.0 standard drinks of alcohol per week. She reports that she does not use drugs.   Current Outpatient Medications:  .  etonogestrel (NEXPLANON) 68 MG IMPL implant, Inject into the skin., Disp: , Rfl:  .  fluconazole (DIFLUCAN) 200 MG tablet, TAKE 2 TABLETS BY MOUTH TODAY AND THEN TAKE TWO TABLETS BY MOUTH IN ONE WEEK, Disp: , Rfl:  .  metronidazole (NORITATE) 1 % cream, Apply topically daily., Disp: 60 g, Rfl: 0 .  Multiple Vitamins-Minerals (WOMENS MULTIVITAMIN PO), Take by mouth., Disp: , Rfl:   EXAM:   VITALS per patient if applicable: none reported  GENERAL: alert, oriented, appears well and in no acute distress  HEENT: atraumatic, conjunttiva clear, no obvious abnormalities on inspection of external nose and ears  NECK: normal movements of the head and neck  LUNGS: on inspection no signs of respiratory distress, breathing rate appears normal, no obvious gross increased work of breathing, gasping or wheezing  CV: no obvious cyanosis  MS: moves all visible extremities without noticeable abnormality  PSYCH/NEURO: pleasant and cooperative, no obvious depression or anxiety, speech and thought processing grossly intact  ASSESSMENT AND  PLAN:   Lump of right breast -Will send for diagnostic mammo [place].  She will f/u with [place] PRN.    I discussed the assessment and treatment plan with the patient. The patient was provided an opportunity to ask questions and all were answered. The patient agreed with the plan and demonstrated an understanding of the instructions.   The patient was advised to call back or seek an in-person evaluation if the symptoms worsen or if the condition fails to improve as anticipated.    Lelon Frohlich, MD  [facility] at [facility]

## 2019-03-18 DIAGNOSIS — Z20828 Contact with and (suspected) exposure to other viral communicable diseases: Secondary | ICD-10-CM | POA: Diagnosis not present

## 2019-03-21 DIAGNOSIS — Z20828 Contact with and (suspected) exposure to other viral communicable diseases: Secondary | ICD-10-CM | POA: Diagnosis not present

## 2019-03-31 ENCOUNTER — Telehealth: Payer: Self-pay | Admitting: *Deleted

## 2019-03-31 ENCOUNTER — Ambulatory Visit: Payer: BC Managed Care – PPO | Admitting: Internal Medicine

## 2019-03-31 DIAGNOSIS — Z3040 Encounter for surveillance of contraceptives, unspecified: Secondary | ICD-10-CM | POA: Diagnosis not present

## 2019-03-31 DIAGNOSIS — Z01419 Encounter for gynecological examination (general) (routine) without abnormal findings: Secondary | ICD-10-CM | POA: Diagnosis not present

## 2019-03-31 DIAGNOSIS — Z6820 Body mass index (BMI) 20.0-20.9, adult: Secondary | ICD-10-CM | POA: Diagnosis not present

## 2019-03-31 NOTE — Telephone Encounter (Signed)
Copied from [place] [id number]. Topic: General - Other >> [name] 31, 2019 12:49 PM Keene Breath wrote: Reason for CRM: Patient called to let the doctor know that she will not make her appt. Today because she is having car trouble.  Tried the office but there was no answer.  Please call patient to reschedule.  CB# [phone]

## 2019-03-31 NOTE — Telephone Encounter (Signed)
noted 

## 2019-04-03 DIAGNOSIS — Z20828 Contact with and (suspected) exposure to other viral communicable diseases: Secondary | ICD-10-CM | POA: Diagnosis not present

## 2019-04-13 DIAGNOSIS — Z20828 Contact with and (suspected) exposure to other viral communicable diseases: Secondary | ICD-10-CM | POA: Diagnosis not present

## 2019-04-18 DIAGNOSIS — Z20828 Contact with and (suspected) exposure to other viral communicable diseases: Secondary | ICD-10-CM | POA: Diagnosis not present

## 2019-04-27 DIAGNOSIS — Z20828 Contact with and (suspected) exposure to other viral communicable diseases: Secondary | ICD-10-CM | POA: Diagnosis not present

## 2019-06-07 DIAGNOSIS — Z20828 Contact with and (suspected) exposure to other viral communicable diseases: Secondary | ICD-10-CM | POA: Diagnosis not present

## 2019-06-09 ENCOUNTER — Telehealth (INDEPENDENT_AMBULATORY_CARE_PROVIDER_SITE_OTHER): Payer: BC Managed Care – PPO | Admitting: Internal Medicine

## 2019-06-09 ENCOUNTER — Other Ambulatory Visit: Payer: Self-pay

## 2019-06-09 DIAGNOSIS — R590 Localized enlarged lymph nodes: Secondary | ICD-10-CM | POA: Diagnosis not present

## 2019-06-09 NOTE — Progress Notes (Signed)
Virtual Visit via Video Note  I connected with Donna Leach on 06/09/19 at  2:00 PM EDT by a video enabled telemedicine application and verified that I am speaking with the correct person using two identifiers.  Location patient: home Location provider: work office Persons participating in the virtual visit: patient, provider  I discussed the limitations of evaluation and management by telemedicine and the availability of in person appointments. The patient expressed understanding and agreed to proceed.   HPI: She has scheduled this acute visit to discuss some acute concerns.  She states she has significant submandibular lymphadenopathy and right-sided nasal congestion.  She went and got tested at [facility] for COVID and was negative.  She is currently living in [place].  She has not had sore throat, runny nose or fever or cough.  She is concerned because this is the second time that she has had submandibular lymphadenopathy in the context of mild URI symptoms.  No neck pain or trouble swallowing.     ROS: Constitutional: Denies fever, chills, diaphoresis, appetite change and fatigue.  HEENT: Denies photophobia, eye pain, redness, hearing loss, ear pain, congestion, sore throat, rhinorrhea, sneezing, mouth sores, trouble swallowing, neck pain, neck stiffness and tinnitus.   Respiratory: Denies SOB, DOE, cough, chest tightness,  and wheezing.   Cardiovascular: Denies chest pain, palpitations and leg swelling.  Gastrointestinal: Denies nausea, vomiting, abdominal pain, diarrhea, constipation, blood in stool and abdominal distention.  Genitourinary: Denies dysuria, urgency, frequency, hematuria, flank pain and difficulty urinating.  Endocrine: Denies: hot or cold intolerance, sweats, changes in hair or nails, polyuria, polydipsia. Musculoskeletal: Denies myalgias, back pain, joint swelling, arthralgias and gait problem.  Skin: Denies pallor, rash and wound.  Neurological: Denies  dizziness, seizures, syncope, weakness, light-headedness, numbness and headaches.  Hematological: Denies easy bruising, personal or family bleeding history  Psychiatric/Behavioral: Denies suicidal ideation, mood changes, confusion, nervousness, sleep disturbance and agitation   Past Medical History:  Diagnosis Date  . Depression   . No pertinent past medical history     Past Surgical History:  Procedure Laterality Date  . Adnoids    . APPENDECTOMY      Family History  Problem Relation Age of Onset  . Hypertension Father   . Diabetes Father   . Diabetes Mother   . Hyperthyroidism Other     SOCIAL HX:   reports that she quit smoking about 2 years ago. She quit after 2.00 years of use. She has never used smokeless tobacco. She reports current alcohol use of about 1.0 standard drinks of alcohol per week. She reports that she does not use drugs.   Current Outpatient Medications:  .  etonogestrel (NEXPLANON) 68 MG IMPL implant, Inject into the skin., Disp: , Rfl:  .  fluconazole (DIFLUCAN) 200 MG tablet, TAKE 2 TABLETS BY MOUTH TODAY AND THEN TAKE TWO TABLETS BY MOUTH IN ONE WEEK, Disp: , Rfl:  .  metronidazole (NORITATE) 1 % cream, Apply topically daily., Disp: 60 g, Rfl: 0 .  Multiple Vitamins-Minerals (WOMENS MULTIVITAMIN PO), Take by mouth., Disp: , Rfl:   EXAM:   VITALS per patient if applicable: None reported  GENERAL: alert, oriented, appears well and in no acute distress  HEENT: atraumatic, conjunttiva clear, no obvious abnormalities on inspection of external nose and ears, wears corrective lenses  NECK: normal movements of the head and neck  LUNGS: on inspection no signs of respiratory distress, breathing rate appears normal, no obvious gross increased work of breathing, gasping or wheezing  CV: no obvious cyanosis  MS: moves all visible extremities without noticeable abnormality  PSYCH/NEURO: pleasant and cooperative, no obvious depression or anxiety, speech and  thought processing grossly intact  ASSESSMENT AND PLAN:   LAD (lymphadenopathy), submandibular  Nasal congestion  -As this is a virtual visit, exam component is unreliable.  I do not visually observe submandibular lymphadenopathy, however she is adamant that it is present and significant. -I have advised that lymphadenopathy is normal in the early stages of a mild upper respiratory infection as this seems to be with her nasal congestion.  She would feel more comfortable being referred to an ENT at this time as she has had submandibular lymphadenopathy in the past.    I discussed the assessment and treatment plan with the patient. The patient was provided an opportunity to ask questions and all were answered. The patient agreed with the plan and demonstrated an understanding of the instructions.   The patient was advised to call back or seek an in-person evaluation if the symptoms worsen or if the condition fails to improve as anticipated.    Lelon Frohlich, MD   Primary Care at [facility]

## 2019-07-01 DIAGNOSIS — J310 Chronic rhinitis: Secondary | ICD-10-CM | POA: Diagnosis not present

## 2019-07-01 DIAGNOSIS — J342 Deviated nasal septum: Secondary | ICD-10-CM | POA: Diagnosis not present

## 2019-07-12 DIAGNOSIS — Z20828 Contact with and (suspected) exposure to other viral communicable diseases: Secondary | ICD-10-CM | POA: Diagnosis not present

## 2019-07-23 DIAGNOSIS — Z20828 Contact with and (suspected) exposure to other viral communicable diseases: Secondary | ICD-10-CM | POA: Diagnosis not present

## 2019-07-27 DIAGNOSIS — Z20828 Contact with and (suspected) exposure to other viral communicable diseases: Secondary | ICD-10-CM | POA: Diagnosis not present

## 2019-07-29 DIAGNOSIS — Z20828 Contact with and (suspected) exposure to other viral communicable diseases: Secondary | ICD-10-CM | POA: Diagnosis not present

## 2019-08-07 DIAGNOSIS — Z20828 Contact with and (suspected) exposure to other viral communicable diseases: Secondary | ICD-10-CM | POA: Diagnosis not present

## 2019-08-15 DIAGNOSIS — Z20828 Contact with and (suspected) exposure to other viral communicable diseases: Secondary | ICD-10-CM | POA: Diagnosis not present

## 2019-08-17 DIAGNOSIS — Z20828 Contact with and (suspected) exposure to other viral communicable diseases: Secondary | ICD-10-CM | POA: Diagnosis not present

## 2019-08-22 DIAGNOSIS — Z20828 Contact with and (suspected) exposure to other viral communicable diseases: Secondary | ICD-10-CM | POA: Diagnosis not present

## 2019-08-23 DIAGNOSIS — Z20828 Contact with and (suspected) exposure to other viral communicable diseases: Secondary | ICD-10-CM | POA: Diagnosis not present

## 2019-08-29 ENCOUNTER — Other Ambulatory Visit: Payer: Self-pay

## 2019-08-29 ENCOUNTER — Telehealth: Payer: Self-pay | Admitting: *Deleted

## 2019-08-29 NOTE — Telephone Encounter (Signed)
Patient called wanting to be seen in office for vaginal issues. Patient stated she was tested in the last 14 days as a precautionary measure and was negative. Patient also stated that she has allergy issues and have had them for a long time. Please advise

## 2019-08-30 ENCOUNTER — Ambulatory Visit: Payer: BC Managed Care – PPO | Admitting: Family Medicine

## 2019-08-30 ENCOUNTER — Other Ambulatory Visit: Payer: Self-pay

## 2019-08-30 NOTE — Telephone Encounter (Signed)
Appointment scheduled with Dr Elease Hashimoto 08/30/2019

## 2019-08-31 ENCOUNTER — Ambulatory Visit: Payer: BC Managed Care – PPO | Admitting: Family Medicine

## 2019-08-31 ENCOUNTER — Encounter: Payer: Self-pay | Admitting: Family Medicine

## 2019-08-31 ENCOUNTER — Other Ambulatory Visit (HOSPITAL_COMMUNITY)
Admission: RE | Admit: 2019-08-31 | Discharge: 2019-08-31 | Disposition: A | Discharge status: Home / Self Care | Payer: BC Managed Care – PPO | Source: Ambulatory Visit | Attending: Family Medicine | Admitting: Family Medicine

## 2019-08-31 VITALS — BP 120/70 | HR 85 | Temp 96.8°F | Resp 12 | Ht 68.0 in | Wt 134.0 lb

## 2019-08-31 DIAGNOSIS — N938 Other specified abnormal uterine and vaginal bleeding: Secondary | ICD-10-CM | POA: Insufficient documentation

## 2019-08-31 DIAGNOSIS — R8290 Unspecified abnormal findings in urine: Secondary | ICD-10-CM | POA: Insufficient documentation

## 2019-08-31 LAB — POCT URINE PREGNANCY: Preg Test, Ur: NEGATIVE

## 2019-08-31 NOTE — Patient Instructions (Signed)
A few things to remember from today's visit:   Abnormal urine odor - Plan: Urinalysis, Routine w reflex microscopic, Culture, Urine  DUB (dysfunctional uterine bleeding) - Plan: CBC, TSH, POCT urine pregnancy  Keep appointment with gyn.  Abnormal Uterine Bleeding Abnormal uterine bleeding means bleeding more than usual from your uterus. It can include:  Bleeding between periods.  Bleeding after sex.  Bleeding that is heavier than normal.  Periods that last longer than usual.  Bleeding after you have stopped having your period (menopause). There are many problems that may cause this. You should see a doctor for any kind of bleeding that is not normal. Treatment depends on the cause of the bleeding. Follow these instructions at home:  Watch your condition for any changes.  Do not use tampons, douche, or have sex, if your doctor tells you not to.  Change your pads often.  Get regular well-woman exams. Make sure they include a pelvic exam and cervical cancer screening.  Keep all follow-up visits as told by your doctor. This is important. Contact a doctor if:  The bleeding lasts more than one week.  You feel dizzy at times.  You feel like you are going to throw up (nauseous).  You throw up. Get help right away if:  You pass out.  You have to change pads every hour.  You have belly (abdominal) pain.  You have a fever.  You get sweaty.  You get weak.  You passing large blood clots from your vagina. Summary  Abnormal uterine bleeding means bleeding more than usual from your uterus.  There are many problems that may cause this. You should see a doctor for any kind of bleeding that is not normal.  Treatment depends on the cause of the bleeding. This information is not intended to replace advice given to you by your health care provider. Make sure you discuss any questions you have with your health care provider. Document Released: 06/22/2009 Document Revised:  08/19/2016 Document Reviewed: 08/19/2016 Elsevier Patient Education  [facility] [facility].  Please be sure medication list is accurate. If a new problem present, please set up appointment sooner than planned today.

## 2019-08-31 NOTE — Progress Notes (Signed)
ACUTE VISIT   HPI:  Chief Complaint  Patient presents with  . Menstrual Problem    Donna Leach is a 28 y.o. female, who is here today complaining of intermittent episodes of vaginal spotting for the past 2 weeks. No prior hx. New partner,she has used condoms. Negative for chills,fever,oral lesions,abdominal pain,N/V, vaginal discharge,dyspareunia,or skin rash. + "little" abdominal bloating.  She has not identified exacerbating or alleviating factors. Problem is stable.  LMP: "Not normal" since she has been on Nexplanon.  Nexplanon x 2 years,this is her 3rd one and has not had problems in the past.  She already has an appt with her gyn in 09/2019.  She is also concerned about odorous urine. Denies dysuria,increased urinary frequency, gross hematuria,or decreased urine output. Hx of UTI's.   Review of Systems  Constitutional: Negative for appetite change and fatigue.  Gastrointestinal:       No changes in bowel habits.  Endocrine: Negative for cold intolerance and heat intolerance.  Genitourinary: Negative for flank pain, genital sores and vaginal pain.  Musculoskeletal: Negative for arthralgias and gait problem.  Skin: Negative for pallor and wound.  Neurological: Negative for dizziness, syncope and headaches.  Rest see pertinent positives and negatives per HPI.   Current Outpatient Medications on File Prior to Visit  Medication Sig Dispense Refill  . etonogestrel (NEXPLANON) 68 MG IMPL implant Inject into the skin.    Marland Kitchen. metronidazole (NORITATE) 1 % cream Apply topically daily. 60 g 0  . Multiple Vitamins-Minerals (WOMENS MULTIVITAMIN PO) Take by mouth.     No current facility-administered medications on file prior to visit.     Past Medical History:  Diagnosis Date  . Depression   . No pertinent past medical history    Allergies  Allergen Reactions  . Amoxicillin Hives  . Penicillins Hives    Has patient had a PCN reaction causing  immediate rash, facial/tongue/throat swelling, SOB or lightheadedness with hypotension: Y Has patient had a PCN reaction causing severe rash involving mucus membranes or skin necrosis: Y Has patient had a PCN reaction that required hospitalization: N Has patient had a PCN reaction occurring within the last 10 years: N If all of the above answers are "NO", then may proceed with Cephalosporin use.   Donna Leach. Suprax [Cefixime]     Social History   Socioeconomic History  . Marital status: Single    Spouse name: Not on file  . Number of children: Not on file  . Years of education: Not on file  . Highest education level: Not on file  Occupational History  . Not on file  Tobacco Use  . Smoking status: Former Smoker    Years: 2.00    Quit date: 11/14/2016    Years since quitting: 2.7  . Smokeless tobacco: Never Used  . Tobacco comment: no cigarettes in 3 months  Substance and Sexual Activity  . Alcohol use: Yes    Alcohol/week: 1.0 standard drinks    Types: 1 Standard drinks or equivalent per week    Comment: socially  . Drug use: No  . Sexual activity: Yes    Birth control/protection: Implant  Other Topics Concern  . Not on file  Social History Narrative  . Not on file   Social Determinants of Health   Financial Resource Strain:   . Difficulty of Paying Living Expenses: Not on file  Food Insecurity:   . Worried About Programme researcher, broadcasting/film/videounning Out of Food in the Last Year: Not on  file  . [place] in the Last Year: Not on file  Transportation Needs:   . Lack of Transportation (Medical): Not on file  . Lack of Transportation (Non-Medical): Not on file  Physical Activity:   . Days of Exercise per Week: Not on file  . Minutes of Exercise per Session: Not on file  Stress:   . Feeling of Stress : Not on file  Social Connections:   . Frequency of Communication with Friends and Family: Not on file  . Frequency of Social Gatherings with Friends and Family: Not on file  . Attends Religious  Services: Not on file  . Active Member of Clubs or Organizations: Not on file  . Attends Archivist Meetings: Not on file  . Marital Status: Not on file    Vitals:   08/31/19 1500  BP: 120/70  Pulse: 85  Resp: 12  Temp: (!) 96.8 F (36 C)  SpO2: 97%   Body mass index is 20.37 kg/m.   Physical Exam  Nursing note and vitals reviewed. Constitutional: She is oriented to person, place, and time. She appears well-developed and well-nourished. No distress.  HENT:  Head: Normocephalic and atraumatic.  Eyes: Conjunctivae are normal.  Cardiovascular: Normal rate and regular rhythm.  Respiratory: Effort normal and breath sounds normal. No respiratory distress.  GI: Soft. She exhibits no mass. There is no hepatomegaly. There is no abdominal tenderness.  Genitourinary: There is no tenderness or lesion on the right labia. There is no tenderness or lesion on the left labia. Uterus is not enlarged and not tender. Cervix exhibits no motion tenderness, no discharge and no friability. Right adnexum displays no mass, no tenderness and no fullness. Left adnexum displays no mass, no tenderness and no fullness.    Vaginal discharge (Thin, whitish,odorless.) present.     No vaginal erythema, tenderness or bleeding.  No erythema, tenderness or bleeding in the vagina.    No signs of injury in the vagina.   Musculoskeletal:        General: No edema.  Lymphadenopathy:       Right: No inguinal adenopathy present.       Left: No inguinal adenopathy present.  Neurological: She is alert and oriented to person, place, and time. Gait normal.  Skin: Skin is warm. No erythema.  Psychiatric: She has a normal mood and affect.  Well groomed, good eye contact.    ASSESSMENT AND PLAN:  [name] was seen today for menstrual problem.  Diagnoses and all orders for this visit:  DUB (dysfunctional uterine bleeding) We discussed possible etiologies, even Nexplanon can be causing problem. Hx and  examination today do not suggest a serious process. She may need a pelvic/transvaginal US, she has appt with gyn next month. Further recommendations will be given according to lab results. If bleeding gets suddenly worse or abdominal pain she needs to seek immediate medical attention, otherwise she can follow with her gyn.  -     CBC -     TSH -     POCT urine pregnancy -     Cervicovaginal ancillary only  Abnormal urine odor Possible causes discussed. I do not think UTI is present at this time given the fact she has no other urinary symptom. ? Dehydration,increase fluid intake. Further recommendations will be given according to lab results.  -     Urinalysis, Routine w reflex microscopic -     Culture, Urine   Return if symptoms worsen or  fail to improve.    Gray Doering G. [place], MD  [facility]. [place] office.

## 2019-09-01 ENCOUNTER — Encounter: Payer: Self-pay | Admitting: Family Medicine

## 2019-09-01 DIAGNOSIS — Z20828 Contact with and (suspected) exposure to other viral communicable diseases: Secondary | ICD-10-CM | POA: Diagnosis not present

## 2019-09-01 LAB — URINE CULTURE
MICRO NUMBER:: 1226852
Result:: NO GROWTH
SPECIMEN QUALITY:: ADEQUATE

## 2019-09-01 LAB — URINALYSIS, ROUTINE W REFLEX MICROSCOPIC
Bilirubin Urine: NEGATIVE
Hgb urine dipstick: NEGATIVE
Ketones, ur: NEGATIVE
Leukocytes,Ua: NEGATIVE
Nitrite: NEGATIVE
Specific Gravity, Urine: 1.015 (ref 1.000–1.030)
Total Protein, Urine: NEGATIVE
Urine Glucose: NEGATIVE
Urobilinogen, UA: 0.2 (ref 0.0–1.0)
pH: 6.5 (ref 5.0–8.0)

## 2019-09-01 LAB — CBC
HCT: 41.3 % (ref 36.0–46.0)
Hemoglobin: 13.9 g/dL (ref 12.0–15.0)
MCHC: 33.6 g/dL (ref 30.0–36.0)
MCV: 99.3 fl (ref 78.0–100.0)
Platelets: 243 10*3/uL (ref 150.0–400.0)
RBC: 4.16 Mil/uL (ref 3.87–5.11)
RDW: 11.7 % (ref 11.5–15.5)
WBC: 6 10*3/uL (ref 4.0–10.5)

## 2019-09-01 LAB — TSH: TSH: 1.51 u[IU]/mL (ref 0.35–4.50)

## 2019-09-06 DIAGNOSIS — Z20828 Contact with and (suspected) exposure to other viral communicable diseases: Secondary | ICD-10-CM | POA: Diagnosis not present

## 2019-09-06 LAB — CERVICOVAGINAL ANCILLARY ONLY
Chlamydia: NEGATIVE
Comment: NEGATIVE
Comment: NEGATIVE
Comment: NORMAL
Neisseria Gonorrhea: NEGATIVE
Trichomonas: NEGATIVE

## 2019-09-07 ENCOUNTER — Encounter: Payer: Self-pay | Admitting: Family Medicine

## 2019-09-15 DIAGNOSIS — Z03818 Encounter for observation for suspected exposure to other biological agents ruled out: Secondary | ICD-10-CM | POA: Diagnosis not present

## 2019-09-16 ENCOUNTER — Ambulatory Visit: Payer: BC Managed Care – PPO | Admitting: Internal Medicine

## 2019-09-18 DIAGNOSIS — Z20828 Contact with and (suspected) exposure to other viral communicable diseases: Secondary | ICD-10-CM | POA: Diagnosis not present

## 2019-09-20 DIAGNOSIS — Z20828 Contact with and (suspected) exposure to other viral communicable diseases: Secondary | ICD-10-CM | POA: Diagnosis not present

## 2019-09-29 DIAGNOSIS — Z20828 Contact with and (suspected) exposure to other viral communicable diseases: Secondary | ICD-10-CM | POA: Diagnosis not present

## 2019-10-02 DIAGNOSIS — R519 Headache, unspecified: Secondary | ICD-10-CM | POA: Diagnosis not present

## 2019-10-02 DIAGNOSIS — Z20828 Contact with and (suspected) exposure to other viral communicable diseases: Secondary | ICD-10-CM | POA: Diagnosis not present

## 2019-10-03 ENCOUNTER — Telehealth: Payer: Self-pay | Admitting: Internal Medicine

## 2019-10-03 ENCOUNTER — Other Ambulatory Visit: Payer: Self-pay

## 2019-10-03 NOTE — Telephone Encounter (Signed)
Pt visited Urgent care on 10/02/19 due to her headache she has had for a week now. She has been tested twice for COVID once on 09/25/19 came back negative  and another on 10/02/19 came back negative. Pt is okay with having a virtual appt that has been set on 10/04/19 at 11:30am . Pt feels that she may have injured her neck when she was working out on 09/26/19 and feels that is why she is having an ongoing headache. She is wondering if she needs to be seen in office or keep it virtually. Pt can be contacted at [phone]

## 2019-10-04 ENCOUNTER — Telehealth (INDEPENDENT_AMBULATORY_CARE_PROVIDER_SITE_OTHER): Payer: BC Managed Care – PPO | Admitting: Internal Medicine

## 2019-10-04 DIAGNOSIS — M542 Cervicalgia: Secondary | ICD-10-CM

## 2019-10-04 DIAGNOSIS — R519 Headache, unspecified: Secondary | ICD-10-CM

## 2019-10-04 NOTE — Telephone Encounter (Signed)
Spoke with patient and Doxy sent

## 2019-10-04 NOTE — Progress Notes (Signed)
Virtual Visit via Video Note  I connected with Donna Leach on 10/04/19 at 11:30 AM EST by a video enabled telemedicine application and verified that I am speaking with the correct person using two identifiers.  Location patient: home Location provider: work office Persons participating in the virtual visit: patient, provider  I discussed the limitations of evaluation and management by telemedicine and the availability of in person appointments. The patient expressed understanding and agreed to proceed.   HPI: She has scheduled this visit to discuss a headache that has been present for about a week.  She first noticed it while she was working out, she felt "as if something was moving around inside her head".  She has had 2 - Covid tests in the past week, she denies any vision disturbance, she has not had fever or confusion.  She did visit the urgent care and was told they did not believe it was anything serious.  She has been using aspirin.  She had her eyes checked within the past couple months.  She has pain if she touches her forehead in the center.  She has a throbbing pain in her head when she bends forward.   ROS: Constitutional: Denies fever, chills, diaphoresis, appetite change and fatigue.  HEENT: Denies photophobia, eye pain, redness, hearing loss, ear pain, congestion, sore throat, rhinorrhea, sneezing, mouth sores, trouble swallowing, neck pain, neck stiffness and tinnitus.   Respiratory: Denies SOB, DOE, cough, chest tightness,  and wheezing.   Cardiovascular: Denies chest pain, palpitations and leg swelling.  Gastrointestinal: Denies nausea, vomiting, abdominal pain, diarrhea, constipation, blood in stool and abdominal distention.  Genitourinary: Denies dysuria, urgency, frequency, hematuria, flank pain and difficulty urinating.  Endocrine: Denies: hot or cold intolerance, sweats, changes in hair or nails, polyuria, polydipsia. Musculoskeletal: Denies myalgias,  back pain, joint swelling, arthralgias and gait problem.  Skin: Denies pallor, rash and wound.  Neurological: Denies dizziness, seizures, syncope, weakness, light-headedness, numbness and headaches.  Hematological: Denies adenopathy. Easy bruising, personal or family bleeding history  Psychiatric/Behavioral: Denies suicidal ideation, mood changes, confusion, nervousness, sleep disturbance and agitation   Past Medical History:  Diagnosis Date  . Depression   . No pertinent past medical history     Past Surgical History:  Procedure Laterality Date  . Adnoids    . APPENDECTOMY      Family History  Problem Relation Age of Onset  . Hypertension Father   . Diabetes Father   . Diabetes Mother   . Hyperthyroidism Other     SOCIAL HX:   reports that she quit smoking about 2 years ago. She quit after 2.00 years of use. She has never used smokeless tobacco. She reports current alcohol use of about 1.0 standard drinks of alcohol per week. She reports that she does not use drugs.   Current Outpatient Medications:  .  etonogestrel (NEXPLANON) 68 MG IMPL implant, Inject into the skin., Disp: , Rfl:  .  metronidazole (NORITATE) 1 % cream, Apply topically daily., Disp: 60 g, Rfl: 0 .  Multiple Vitamins-Minerals (WOMENS MULTIVITAMIN PO), Take by mouth., Disp: , Rfl:   EXAM:   VITALS per patient if applicable: None reported  GENERAL: alert, oriented, appears well and in no acute distress  HEENT: atraumatic, conjunttiva clear, no obvious abnormalities on inspection of external nose and ears  NECK: normal movements of the head and neck  LUNGS: on inspection no signs of respiratory distress, breathing rate appears normal, no obvious gross increased work of  breathing, gasping or wheezing  CV: no obvious cyanosis  MS: moves all visible extremities without noticeable abnormality  PSYCH/NEURO: pleasant and cooperative, no obvious depression or anxiety, speech and thought processing grossly  intact  ASSESSMENT AND PLAN:   Sinus headache Neck pain -Advised antihistamine and Mucinex daily, OTC pain relievers as needed. -For neck pain May apply ice, local massage therapy. -Return to clinic in 10 to 14 days if no improvement.     I discussed the assessment and treatment plan with the patient. The patient was provided an opportunity to ask questions and all were answered. The patient agreed with the plan and demonstrated an understanding of the instructions.   The patient was advised to call back or seek an in-person evaluation if the symptoms worsen or if the condition fails to improve as anticipated.    [name], MD  [facility]

## 2019-10-05 DIAGNOSIS — Z20828 Contact with and (suspected) exposure to other viral communicable diseases: Secondary | ICD-10-CM | POA: Diagnosis not present

## 2019-10-06 ENCOUNTER — Encounter: Payer: Self-pay | Admitting: Internal Medicine

## 2019-10-06 ENCOUNTER — Ambulatory Visit: Payer: BC Managed Care – PPO | Admitting: Internal Medicine

## 2019-10-07 DIAGNOSIS — Z20828 Contact with and (suspected) exposure to other viral communicable diseases: Secondary | ICD-10-CM | POA: Diagnosis not present

## 2019-10-15 DIAGNOSIS — Z20828 Contact with and (suspected) exposure to other viral communicable diseases: Secondary | ICD-10-CM | POA: Diagnosis not present

## 2019-10-16 ENCOUNTER — Encounter (HOSPITAL_COMMUNITY): Payer: Self-pay | Admitting: Emergency Medicine

## 2019-10-16 ENCOUNTER — Emergency Department (HOSPITAL_COMMUNITY)
Admission: EM | Admit: 2019-10-16 | Discharge: 2019-10-16 | Disposition: A | Discharge status: Home / Self Care | Payer: BC Managed Care – PPO | Attending: Emergency Medicine | Admitting: Emergency Medicine

## 2019-10-16 ENCOUNTER — Other Ambulatory Visit: Payer: Self-pay

## 2019-10-16 DIAGNOSIS — Z79899 Other long term (current) drug therapy: Secondary | ICD-10-CM | POA: Insufficient documentation

## 2019-10-16 DIAGNOSIS — Y929 Unspecified place or not applicable: Secondary | ICD-10-CM | POA: Diagnosis not present

## 2019-10-16 DIAGNOSIS — Y999 Unspecified external cause status: Secondary | ICD-10-CM | POA: Insufficient documentation

## 2019-10-16 DIAGNOSIS — S50812A Abrasion of left forearm, initial encounter: Secondary | ICD-10-CM | POA: Diagnosis not present

## 2019-10-16 DIAGNOSIS — S50819A Abrasion of unspecified forearm, initial encounter: Secondary | ICD-10-CM | POA: Insufficient documentation

## 2019-10-16 DIAGNOSIS — W5503XA Scratched by cat, initial encounter: Secondary | ICD-10-CM | POA: Diagnosis not present

## 2019-10-16 DIAGNOSIS — Z87891 Personal history of nicotine dependence: Secondary | ICD-10-CM | POA: Insufficient documentation

## 2019-10-16 DIAGNOSIS — Y939 Activity, unspecified: Secondary | ICD-10-CM | POA: Insufficient documentation

## 2019-10-16 DIAGNOSIS — S60512A Abrasion of left hand, initial encounter: Secondary | ICD-10-CM | POA: Diagnosis not present

## 2019-10-16 MED ORDER — BACITRACIN ZINC 500 UNIT/GM EX OINT: 1.0000 "application " | TOPICAL_OINTMENT | Freq: Two times a day (BID) | CUTANEOUS | 0 refills | Status: DC

## 2019-10-16 MED ORDER — SULFAMETHOXAZOLE-TRIMETHOPRIM 800-160 MG PO TABS: 1.0000 | ORAL_TABLET | Freq: Two times a day (BID) | ORAL | 0 refills | Status: AC

## 2019-10-16 NOTE — ED Notes (Signed)
Pt discharge and prescription education provided. Pt is alert and oriented x 4 and ambulatory at discharge. Pt verbalizes understanding.  

## 2019-10-16 NOTE — ED Provider Notes (Signed)
[facility] EMERGENCY DEPARTMENT Provider Note   CSN: [id number] Arrival date & time: 10/16/19  1429    History Chief Complaint  Patient presents with  . cat scratches    Donna Leach is a 29 y.o. female with missing past medical history who presents for evaluation of cat scratches and possible cat bite.  Patient states she was scratched yesterday evening to her left forearm and right hand.  She is unsure if cat bit her.  She is unsure if cat was up-to-date on its vaccines.  No pain.  Denies fever, chills, nausea, vomiting, lymphadenopathy, redness, swelling, warmth, bleeding or drainage from wound.  Denies any pain.  Denies additional aggravating or alleviating factors.  History obtained from patient and past medical records.  No interpreter is used.  HPI     Past Medical History:  Diagnosis Date  . Depression   . No pertinent past medical history     Patient Active Problem List   Diagnosis Date Noted  . Thyroiditis 11/04/2018  . Traumatic hematoma of finger 11/06/2016  . Depression 04/08/2016    Past Surgical History:  Procedure Laterality Date  . Adnoids    . APPENDECTOMY       OB History    Gravida  0   Para      Term      Preterm      AB      Living        SAB      TAB      Ectopic      Multiple      Live Births              Family History  Problem Relation Age of Onset  . Hypertension Father   . Diabetes Father   . Diabetes Mother   . Hyperthyroidism Other     Social History   Tobacco Use  . Smoking status: Former Smoker    Years: 2.00    Quit date: 11/14/2016    Years since quitting: 2.9  . Smokeless tobacco: Never Used  . Tobacco comment: no cigarettes in 3 months  Substance Use Topics  . Alcohol use: Yes    Alcohol/week: 1.0 standard drinks    Types: 1 Standard drinks or equivalent per week    Comment: socially  . Drug use: No    Home Medications Prior to Admission medications   Medication  Sig Start Date End Date Taking? Authorizing Provider  bacitracin ointment Apply 1 application topically 2 (two) times daily. 10/16/19   Creed Kail A, PA-C  etonogestrel (NEXPLANON) 68 MG IMPL implant Inject into the skin. 11/11/11   [provider]  metronidazole (NORITATE) 1 % cream Apply topically daily. 12/22/18   Bennie Pierini, FNP  Multiple Vitamins-Minerals (WOMENS MULTIVITAMIN PO) Take by mouth.    [provider]  sulfamethoxazole-trimethoprim (BACTRIM DS) 800-160 MG tablet Take 1 tablet by mouth 2 (two) times daily for 5 days. 10/16/19 10/21/19  [name] A, PA-C    Allergies    Amoxicillin, Penicillins, and Suprax [cefixime]  Review of Systems   Review of Systems  Constitutional: Negative.   HENT: Negative.   Respiratory: Negative.   Cardiovascular: Negative.   Gastrointestinal: Negative.   Musculoskeletal: Negative.   Skin: Positive for wound.  All other systems reviewed and are negative.   Physical Exam Updated Vital Signs BP 107/87 (BP Location: Right Arm)   Pulse 82   Temp 98.2 F (36.8 C) (  Oral)   Resp 14   SpO2 98%   Physical Exam Vitals and nursing note reviewed.  Constitutional:      General: She is not in acute distress.    Appearance: She is well-developed. She is not ill-appearing or toxic-appearing.  HENT:     Head: Normocephalic and atraumatic.     Nose: Nose normal.     Mouth/Throat:     Mouth: Mucous membranes are moist.     Pharynx: Oropharynx is clear.  Eyes:     Pupils: Pupils are equal, round, and reactive to light.  Cardiovascular:     Rate and Rhythm: Normal rate.     Pulses: Normal pulses.     Heart sounds: Normal heart sounds.  Pulmonary:     Effort: Pulmonary effort is normal. No respiratory distress.     Breath sounds: Normal breath sounds.  Abdominal:     General: There is no distension.  Musculoskeletal:        General: No swelling, tenderness, deformity or signs of injury. Normal range of  motion.     Cervical back: Normal range of motion.     Left lower leg: No edema.  Skin:    General: Skin is warm and dry.     Capillary Refill: Capillary refill takes less than 2 seconds.     Comments: Very superficial abrasion to right ventral hand and left dorsal forearm. No bites  Neurological:     General: No focal deficit present.     Mental Status: She is alert and oriented to person, place, and time.       ED Results / Procedures / Treatments   Labs (all labs ordered are listed, but only abnormal results are displayed) Labs Reviewed - No data to display  EKG None  Radiology No results found.  Procedures Procedures (including critical care time)  Medications Ordered in ED Medications - No data to display  ED Course  I have reviewed the triage vital signs and the nursing notes.  Pertinent labs & imaging results that were available during my care of the patient were reviewed by me and considered in my medical decision making (see chart for details).  29 year old female appears otherwise well presents for evaluation of cat scratch by friends cat.  Unsure of updated rabies series.  She denies any cat bite however states she possibly got Saliva onto her skin.  Patient was very minimal and very superficial abrasions to her right hand and her left lateral forearm.  No lacerations to suture.  Her tetanus is up-to-date.  Patient states she will know tomorrow if cat was up-to-date on rabies series.  Discussed risk versus benefit of delayed rabies series.  She voices understanding and elects to defer on rabies series until she is official tomorrow.  Her wounds not actively infected.  Wounds cleaned.  Bacitracin ointment applied. Allergy to Pen abx. She is to follow-up in 2 days for wound recheck with her PCP.  I discussed where she would need to be reevaluated if she does not needing the rabies series.  The patient has been appropriately medically screened and/or stabilized in the  ED. I have low suspicion for any other emergent medical condition which would require further screening, evaluation or treatment in the ED or require inpatient management.  Patient is hemodynamically stable and in no acute distress.  Patient able to ambulate in department prior to ED.  Evaluation does not show acute pathology that would require ongoing or additional emergent interventions  while in the emergency department or further inpatient treatment.  I have discussed the diagnosis with the patient and answered all questions.  Pain is been managed while in the emergency department and patient has no further complaints prior to discharge.  Patient is comfortable with plan discussed in room and is stable for discharge at this time.  I have discussed strict return precautions for returning to the emergency department.  Patient was encouraged to follow-up with PCP/specialist refer to at discharge.    MDM Rules/Calculators/A&P                      Final Clinical Impression(s) / ED Diagnoses Final diagnoses:  Cat scratch    Rx / DC Orders ED Discharge Orders         Ordered    bacitracin ointment  2 times daily     10/16/19 1545    sulfamethoxazole-trimethoprim (BACTRIM DS) 800-160 MG tablet  2 times daily     10/16/19 1548           Kaleab Frasier A, PA-C 10/16/19 1549    Quintella Reichert, MD 10/17/19 1204

## 2019-10-16 NOTE — ED Triage Notes (Signed)
Pt states she was scratched by a cat to L forearm, R hand, and L upper leg yesterday.  States she is unsure if cat is up-to-date on vaccinations.  No signs of infection.

## 2019-10-16 NOTE — Discharge Instructions (Signed)
Follow-up outpatient if you end up needing the rabies series.  I have started with some antibiotics.

## 2019-10-17 ENCOUNTER — Emergency Department (HOSPITAL_COMMUNITY)
Admission: EM | Admit: 2019-10-17 | Discharge: 2019-10-17 | Disposition: A | Discharge status: Home / Self Care | Payer: BC Managed Care – PPO | Attending: Emergency Medicine | Admitting: Emergency Medicine

## 2019-10-17 ENCOUNTER — Other Ambulatory Visit: Payer: Self-pay

## 2019-10-17 DIAGNOSIS — W5503XA Scratched by cat, initial encounter: Secondary | ICD-10-CM | POA: Insufficient documentation

## 2019-10-17 DIAGNOSIS — Z23 Encounter for immunization: Secondary | ICD-10-CM | POA: Diagnosis not present

## 2019-10-17 DIAGNOSIS — Z2914 Encounter for prophylactic rabies immune globin: Secondary | ICD-10-CM | POA: Diagnosis not present

## 2019-10-17 DIAGNOSIS — Y999 Unspecified external cause status: Secondary | ICD-10-CM | POA: Insufficient documentation

## 2019-10-17 DIAGNOSIS — Z203 Contact with and (suspected) exposure to rabies: Secondary | ICD-10-CM | POA: Insufficient documentation

## 2019-10-17 DIAGNOSIS — Z79899 Other long term (current) drug therapy: Secondary | ICD-10-CM | POA: Diagnosis not present

## 2019-10-17 DIAGNOSIS — Y939 Activity, unspecified: Secondary | ICD-10-CM | POA: Diagnosis not present

## 2019-10-17 DIAGNOSIS — Z87891 Personal history of nicotine dependence: Secondary | ICD-10-CM | POA: Diagnosis not present

## 2019-10-17 DIAGNOSIS — Y92039 Unspecified place in apartment as the place of occurrence of the external cause: Secondary | ICD-10-CM | POA: Diagnosis not present

## 2019-10-17 DIAGNOSIS — S50812A Abrasion of left forearm, initial encounter: Secondary | ICD-10-CM | POA: Diagnosis not present

## 2019-10-17 MED ORDER — RABIES VACCINE, PCEC IM SUSR
1.0000 mL | Freq: Once | INTRAMUSCULAR | Status: AC
  Administered 2019-10-17: 13:00:00 1 mL via INTRAMUSCULAR
  Filled 2019-10-17: qty 1

## 2019-10-17 NOTE — Discharge Instructions (Addendum)
Please follow-up on 2/11 at the [facility] urgent care for your second rabies vaccination, you will only need 1 additional shot since you have already had complete rabies series in 2018.  Continue to monitor scratches for signs of infection but they look good today.

## 2019-10-17 NOTE — ED Triage Notes (Signed)
Pt was scratched by a cat two days ago. Seen for same, did not receive rabies vaccinations because she was hoping to hear back from the cat's owner about the cat's status but she has not heard back so she would like the rabies tx today.

## 2019-10-17 NOTE — ED Provider Notes (Signed)
[facility] EMERGENCY DEPARTMENT Provider Note   CSN: [id number] Arrival date & time: 10/17/19  1201     History Chief Complaint  Patient presents with  . Rabies Injection    Donna Leach is a 29 y.o. female.  Donna Leach is a 29 y.o. female with a history of depression and thyroiditis, who presents to the ED for rabies vaccination.  She was scratched by a cat 2 days ago and is concerned she may have also been bitten.  The cat lives outside of her apartment complex.  She was seen in the ED yesterday and had no signs of infection but was waiting to hear back from the owner on rabies vaccination history and wanted to hold off on vaccination.  She has been unable to get a straight answer from the cats owner as to whether or not the cat is up-to-date on its vaccinations or returns today requesting rabies vaccination.  She had the full rabies vaccination series and immunoglobulin back in 2018 when she was bit by a pit bull, has not had any rabies vaccination since then.  States that she followed up at urgent care for the entire vaccination series after the dog bite.  Denies any new signs of redness or infection around the scratches.  No fevers or chills.        Past Medical History:  Diagnosis Date  . Depression   . No pertinent past medical history     Patient Active Problem List   Diagnosis Date Noted  . Thyroiditis 11/04/2018  . Traumatic hematoma of finger 11/06/2016  . Depression 04/08/2016    Past Surgical History:  Procedure Laterality Date  . Adnoids    . APPENDECTOMY       OB History    Gravida  0   Para      Term      Preterm      AB      Living        SAB      TAB      Ectopic      Multiple      Live Births              Family History  Problem Relation Age of Onset  . Hypertension Father   . Diabetes Father   . Diabetes Mother   . Hyperthyroidism Other     Social History   Tobacco Use  .  Smoking status: Former Smoker    Years: 2.00    Quit date: 11/14/2016    Years since quitting: 2.9  . Smokeless tobacco: Never Used  . Tobacco comment: no cigarettes in 3 months  Substance Use Topics  . Alcohol use: Yes    Alcohol/week: 1.0 standard drinks    Types: 1 Standard drinks or equivalent per week    Comment: socially  . Drug use: No    Home Medications Prior to Admission medications   Medication Sig Start Date End Date Taking? Authorizing Provider  bacitracin ointment Apply 1 application topically 2 (two) times daily. 10/16/19   Henderly, Britni A, PA-C  etonogestrel (NEXPLANON) 68 MG IMPL implant Inject into the skin. 11/11/11   [provider]  metronidazole (NORITATE) 1 % cream Apply topically daily. 12/22/18   Chevis Pretty, FNP  Multiple Vitamins-Minerals (WOMENS MULTIVITAMIN PO) Take by mouth.    [provider]  sulfamethoxazole-trimethoprim (BACTRIM DS) 800-160 MG tablet Take 1 tablet by mouth 2 (two) times daily for  5 days. 10/16/19 10/21/19  Henderly, Britni A, PA-C    Allergies    Amoxicillin, Penicillins, and Suprax [cefixime]  Review of Systems   Review of Systems  Constitutional: Negative for chills and fever.  Skin: Positive for wound.    Physical Exam Updated Vital Signs BP 139/82 (BP Location: Left Arm)   Pulse 81   Temp 98.6 F (37 C) (Oral)   Resp 16   SpO2 100%   Physical Exam Vitals and nursing note reviewed.  Constitutional:      General: She is not in acute distress.    Appearance: Normal appearance. She is well-developed and normal weight. She is not ill-appearing or diaphoretic.  HENT:     Head: Normocephalic and atraumatic.  Eyes:     General:        Right eye: No discharge.        Left eye: No discharge.  Pulmonary:     Effort: Pulmonary effort is normal. No respiratory distress.  Skin:    General: Skin is warm and dry.     Comments: Small scratches noted to the left forearm and the right thumb with no  surrounding erythema or purulence  Neurological:     Mental Status: She is alert.     Coordination: Coordination normal.  Psychiatric:        Mood and Affect: Mood normal.        Behavior: Behavior normal.     ED Results / Procedures / Treatments   Labs (all labs ordered are listed, but only abnormal results are displayed) Labs Reviewed - No data to display  EKG None  Radiology No results found.  Procedures Procedures (including critical care time)  Medications Ordered in ED Medications - No data to display  ED Course  I have reviewed the triage vital signs and the nursing notes.  Pertinent labs & imaging results that were available during my care of the patient were reviewed by me and considered in my medical decision making (see chart for details).    MDM Rules/Calculators/A&P                     29 year old female presents requesting rabies vaccination after she was scratched and possibly bitten by a cat that lives outside her apartment complex, she was unable to get a clear history of rabies vaccination, had previous rabies vaccination series 2 years ago so patient will not require immunoglobulin, has been given rabies vaccine today and will follow up in 3 days for second booster shot.  Scratches today are clean and appear to be healing well, no surrounding erythema or signs of infection.  Patient discharged home in good condition.  Expresses understanding and agreement with plan.  Final Clinical Impression(s) / ED Diagnoses Final diagnoses:  Need for rabies vaccination    Rx / DC Orders ED Discharge Orders    None       Legrand Rams 10/17/19 1330    Benjiman Core, MD 10/17/19 1540

## 2019-10-18 DIAGNOSIS — Z20828 Contact with and (suspected) exposure to other viral communicable diseases: Secondary | ICD-10-CM | POA: Diagnosis not present

## 2019-10-20 ENCOUNTER — Ambulatory Visit (HOSPITAL_COMMUNITY)
Admission: EM | Admit: 2019-10-20 | Discharge: 2019-10-20 | Disposition: A | Discharge status: Home / Self Care | Payer: BC Managed Care – PPO | Attending: Internal Medicine | Admitting: Internal Medicine

## 2019-10-20 ENCOUNTER — Other Ambulatory Visit: Payer: Self-pay

## 2019-10-20 ENCOUNTER — Encounter (HOSPITAL_COMMUNITY): Payer: Self-pay

## 2019-10-20 DIAGNOSIS — Z203 Contact with and (suspected) exposure to rabies: Secondary | ICD-10-CM

## 2019-10-20 DIAGNOSIS — Z20828 Contact with and (suspected) exposure to other viral communicable diseases: Secondary | ICD-10-CM | POA: Diagnosis not present

## 2019-10-20 DIAGNOSIS — Z23 Encounter for immunization: Secondary | ICD-10-CM

## 2019-10-20 MED ORDER — RABIES VACCINE, PCEC IM SUSR
INTRAMUSCULAR | Status: AC
  Filled 2019-10-20: qty 1

## 2019-10-20 MED ORDER — RABIES VACCINE, PCEC IM SUSR
1.0000 mL | Freq: Once | INTRAMUSCULAR | Status: AC
  Administered 2019-10-20: 1 mL via INTRAMUSCULAR

## 2019-10-20 NOTE — ED Triage Notes (Signed)
Pt presents for rabies vaccine 3 day injection. Had full series 2018. ER instructed pt that she would only today's vaccine 2/2 full series in 2018. Pt denies complaint. Admin into right deltoid.

## 2019-10-23 DIAGNOSIS — Z20828 Contact with and (suspected) exposure to other viral communicable diseases: Secondary | ICD-10-CM | POA: Diagnosis not present

## 2019-10-25 ENCOUNTER — Encounter: Payer: Self-pay | Admitting: Internal Medicine

## 2019-10-26 ENCOUNTER — Telehealth: Payer: Self-pay | Admitting: Internal Medicine

## 2019-10-26 DIAGNOSIS — S0081XA Abrasion of other part of head, initial encounter: Secondary | ICD-10-CM | POA: Diagnosis not present

## 2019-10-26 DIAGNOSIS — R519 Headache, unspecified: Secondary | ICD-10-CM | POA: Diagnosis not present

## 2019-10-26 DIAGNOSIS — W19XXXA Unspecified fall, initial encounter: Secondary | ICD-10-CM | POA: Diagnosis not present

## 2019-10-26 DIAGNOSIS — M542 Cervicalgia: Secondary | ICD-10-CM | POA: Diagnosis not present

## 2019-10-26 DIAGNOSIS — S0990XA Unspecified injury of head, initial encounter: Secondary | ICD-10-CM | POA: Diagnosis not present

## 2019-10-26 NOTE — Telephone Encounter (Signed)
Pt call and stated she is still have the headaches and now pain in her neck pt want to do labs and would like a call back

## 2019-10-26 NOTE — Telephone Encounter (Signed)
Left detailed message on machine returning patient's call. Okay to schedule a virtual visit with a different provider.

## 2019-11-01 ENCOUNTER — Encounter: Payer: Self-pay | Admitting: Family Medicine

## 2019-11-01 ENCOUNTER — Other Ambulatory Visit: Payer: Self-pay

## 2019-11-01 ENCOUNTER — Telehealth (INDEPENDENT_AMBULATORY_CARE_PROVIDER_SITE_OTHER): Payer: BC Managed Care – PPO | Admitting: Family Medicine

## 2019-11-01 DIAGNOSIS — M2550 Pain in unspecified joint: Secondary | ICD-10-CM | POA: Diagnosis not present

## 2019-11-01 DIAGNOSIS — L819 Disorder of pigmentation, unspecified: Secondary | ICD-10-CM

## 2019-11-01 NOTE — Patient Instructions (Signed)
-  We placed a referral for you as discussed. It usually takes about 1 week to process and schedule this referral. If you have not heard from [place] regarding this appointment in 1  Week please contact our office.

## 2019-11-01 NOTE — Progress Notes (Signed)
Virtual Visit via Video Note  I connected with Donna Leach  on 11/01/19 at 12:20 PM EST by a video enabled telemedicine application and verified that I am speaking with the correct person using two identifiers.  Location patient: home Location provider:work or home office Persons participating in the virtual visit: patient, provider  I discussed the limitations of evaluation and management by telemedicine and the availability of in person appointments. The patient expressed understanding and agreed to proceed.   HPI:  Acute visit for joint pain: -x 1 month or so -reports this is in both hands -pinky and ring finger on both sides, they hurt in joints and and are always cold -reports fingers turn purple when she goes outside -reports had this in the past and she saw another doctor about this but they did not think it was much -reports knees get red and sore intermittently chronically -she is worried about underlying rheumatological disorders -she reports feels tired the last 1.5 years, low energy for her -denies fevers, unexplained weight loss, rashes - other than on hands which she was told is eczema and she will be seeing the dermatologist for this -she has had a stressful year and does tend to be anxious -does not think has COVID19 -no tick bites she is aware of   ROS: See pertinent positives and negatives per HPI.  Past Medical History:  Diagnosis Date  . Depression   . No pertinent past medical history     Past Surgical History:  Procedure Laterality Date  . Adnoids    . APPENDECTOMY      Family History  Problem Relation Age of Onset  . Hypertension Father   . Diabetes Father   . Diabetes Mother   . Hyperthyroidism Other     SOCIAL HX: see hpi   Current Outpatient Medications:  .  Ascorbic Acid (VITAMIN C PO), Take by mouth., Disp: , Rfl:  .  Cyanocobalamin (B-12 PO), Take by mouth., Disp: , Rfl:  .  etonogestrel (NEXPLANON) 68 MG IMPL implant, Inject into the  skin., Disp: , Rfl:  .  Multiple Vitamins-Minerals (ZINC PO), Take by mouth., Disp: , Rfl:   EXAM:  VITALS per patient if applicable:  GENERAL: alert, oriented, appears well and in no acute distress  HEENT: atraumatic, conjunttiva clear, no obvious abnormalities on inspection of external nose and ears  NECK: normal movements of the head and neck  LUNGS: on inspection no signs of respiratory distress, breathing rate appears normal, no obvious gross SOB, gasping or wheezing  CV: no obvious cyanosis  MS: moves all visible extremities without noticeable abnormality, no sig abnormalities on visualization of jts of the fingers on limited exam  PSYCH/NEURO: pleasant and cooperative, no obvious depression or anxiety, speech and thought processing grossly intact  ASSESSMENT AND PLAN:  Discussed the following assessment and plan:  Arthralgia, unspecified joint - Plan: Ambulatory referral to Rheumatology  Discoloration of skin of finger - Plan: Ambulatory referral to Rheumatology  -we discussed possible serious and likely etiologies, options for evaluation and workup, limitations of telemedicine visit vs in person visit, treatment, treatment risks and precautions. She has reported intermittent redness/swelling pain in various joints - mainly the fingers and knees and also some discoloration of hands with cold temperatures. Discussed options for evaluation and she has opted to see rheumatology. Referral placed as PCP is doing inpatient medicine currently to assist with COVID19 pandemic. Advised to call if she has not heard from scheduling staff in next 1 week about  this appt. Patient agrees to seek prompt in person care if worsening or new symptoms arise in the interim.   I discussed the assessment and treatment plan with the patient. The patient was provided an opportunity to ask questions and all were answered. The patient agreed with the plan and demonstrated an understanding of the  instructions.   The patient was advised to call back or seek an in-person evaluation if the symptoms worsen or if the condition fails to improve as anticipated.   Lucretia Kern, DO

## 2019-11-03 ENCOUNTER — Encounter: Payer: Self-pay | Admitting: Family Medicine

## 2019-11-05 DIAGNOSIS — Z20828 Contact with and (suspected) exposure to other viral communicable diseases: Secondary | ICD-10-CM | POA: Diagnosis not present

## 2019-11-09 ENCOUNTER — Telehealth: Payer: Self-pay | Admitting: Internal Medicine

## 2019-11-09 NOTE — Telephone Encounter (Signed)
Patient is requesting her referral to be faxed to her.  She is requesting a call back at [phone]

## 2019-11-11 NOTE — Telephone Encounter (Signed)
I called the pt left a message to call our office. I want to know what is she needing, and I have questions about her message about needing a referral fax awaiting pt call back

## 2019-11-11 NOTE — Telephone Encounter (Signed)
Pt requesting a referral to go to [facility] rheumatology she did not want a referral fax to her fax referral to [facility] [address] [place], [place] [address] [phone] Office [phone] Pt requested to use fax number [phone]

## 2019-11-12 DIAGNOSIS — Z20828 Contact with and (suspected) exposure to other viral communicable diseases: Secondary | ICD-10-CM | POA: Diagnosis not present

## 2019-11-17 DIAGNOSIS — L309 Dermatitis, unspecified: Secondary | ICD-10-CM | POA: Diagnosis not present

## 2019-11-17 DIAGNOSIS — B360 Pityriasis versicolor: Secondary | ICD-10-CM | POA: Diagnosis not present

## 2019-11-18 DIAGNOSIS — Z20828 Contact with and (suspected) exposure to other viral communicable diseases: Secondary | ICD-10-CM | POA: Diagnosis not present

## 2019-11-24 ENCOUNTER — Other Ambulatory Visit: Payer: Self-pay

## 2019-11-25 ENCOUNTER — Encounter: Payer: Self-pay | Admitting: Internal Medicine

## 2019-11-25 ENCOUNTER — Ambulatory Visit (INDEPENDENT_AMBULATORY_CARE_PROVIDER_SITE_OTHER): Payer: BC Managed Care – PPO | Admitting: Internal Medicine

## 2019-11-25 VITALS — BP 90/64 | HR 92 | Temp 98.1°F | Wt 134.1 lb

## 2019-11-25 DIAGNOSIS — F331 Major depressive disorder, recurrent, moderate: Secondary | ICD-10-CM

## 2019-11-25 NOTE — Progress Notes (Signed)
Established Patient Office Visit     This visit occurred during the SARS-CoV-2 public health emergency.  Safety protocols were in place, including screening questions prior to the visit, additional usage of staff PPE, and extensive cleaning of exam room while observing appropriate contact time as indicated for disinfecting solutions.    CC/Reason for Visit: Follow-up  HPI: Donna Leach is a 29 y.o. female who is coming in today for the above mentioned reasons.  She saw another provider in the office a few weeks ago due to pain in which she believes is swelling and color changes in her hands.  She also has eczema of the dorsal surfaces of her hands and has been using a steroid cream.  Rheumatology referral has been placed, but since she has now moved to [facility] she is waiting for an appointment there.  She has a longstanding history of depression and anxiety, has a psychiatrist in [place] but is not currently being treated.  Past Medical/Surgical History: Past Medical History:  Diagnosis Date  . Depression   . No pertinent past medical history     Past Surgical History:  Procedure Laterality Date  . Adnoids    . APPENDECTOMY      Social History:  reports that she quit smoking about 3 years ago. She quit after 2.00 years of use. She has never used smokeless tobacco. She reports current alcohol use of about 1.0 standard drinks of alcohol per week. She reports that she does not use drugs.  Allergies: Allergies  Allergen Reactions  . Amoxicillin Hives  . Penicillins Hives    Has patient had a PCN reaction causing immediate rash, facial/tongue/throat swelling, SOB or lightheadedness with hypotension: Y Has patient had a PCN reaction causing severe rash involving mucus membranes or skin necrosis: Y Has patient had a PCN reaction that required hospitalization: N Has patient had a PCN reaction occurring within the last 10 years: N If all of the above answers are "NO",  then may proceed with Cephalosporin use.   Wynona Neat [Cefixime]     Family History:  Family History  Problem Relation Age of Onset  . Hypertension Father   . Diabetes Father   . Diabetes Mother   . Hyperthyroidism Other      Current Outpatient Medications:  .  Ascorbic Acid (VITAMIN C PO), Take by mouth., Disp: , Rfl:  .  clobetasol cream (TEMOVATE) 0.05 %, Apply topically 2 (two) times daily., Disp: , Rfl:  .  Cyanocobalamin (B-12 PO), Take by mouth., Disp: , Rfl:  .  etonogestrel (NEXPLANON) 68 MG IMPL implant, Inject into the skin., Disp: , Rfl:  .  ketoconazole (NIZORAL) 2 % shampoo, APPLY ONCE DAILY TO SCALP ONCE DAILY FOR ONE WEEK THEN DECREASE TO ONCE A WEEK, Disp: , Rfl:  .  Multiple Vitamins-Minerals (ZINC PO), Take by mouth., Disp: , Rfl:   Review of Systems:  Constitutional: Denies fever, chills, diaphoresis, appetite change and fatigue.  HEENT: Denies photophobia, eye pain, redness, hearing loss, ear pain, congestion, sore throat, rhinorrhea, sneezing, mouth sores, trouble swallowing, neck pain, neck stiffness and tinnitus.   Respiratory: Denies SOB, DOE, cough, chest tightness,  and wheezing.   Cardiovascular: Denies chest pain, palpitations and leg swelling.  Gastrointestinal: Denies nausea, vomiting, abdominal pain, diarrhea, constipation, blood in stool and abdominal distention.  Genitourinary: Denies dysuria, urgency, frequency, hematuria, flank pain and difficulty urinating.  Endocrine: Denies: hot or cold intolerance, sweats, changes in hair or nails, polyuria, polydipsia.  Musculoskeletal: Denies myalgias, back pain, joint swelling, arthralgias and gait problem.  Skin: Denies pallor, rash and wound.  Neurological: Denies dizziness, seizures, syncope, weakness, light-headedness, numbness and headaches.  Hematological: Denies adenopathy. Easy bruising, personal or family bleeding history  Psychiatric/Behavioral: Denies suicidal ideation, mood changes, confusion,  nervousness, sleep disturbance and agitation    Physical Exam: Vitals:   11/25/19 1136  BP: 90/64  Pulse: 92  Temp: 98.1 F (36.7 C)  TempSrc: Temporal  SpO2: 98%  Weight: 134 lb 1.6 oz (60.8 kg)    Body mass index is 20.39 kg/m.   Constitutional: NAD, calm, comfortable Eyes: PERRL, lids and conjunctivae normal ENMT: Mucous membranes are moist.   Skin: Eczema of dorsal surfaces of both hands and wrists Neurologic: CN 2-12 grossly intact. Sensation intact, DTR normal. Strength 5/5 in all 4.  Psychiatric: Normal judgment and insight. Alert and oriented x 3.  Mood appears depressed, slightly tearful   Impression and Plan:  Moderate episode of recurrent major depressive disorder (HCC) -I wonder if all her complaints throughout the year could be related to a spike in her anxiety/depression. -Given how complex she is and the fact that she has been on many antidepressants throughout the years, I think she would be better served by visiting her psychiatrist who knows her history. -I do believe it is important to get through with the rheumatology referral although I doubt that there will be any resolution from this.    Patient Instructions  -Nice seeing you today!!  -Keep me updated on the status of your rheumatology and psychiatry appointments.  -Schedule follow up as needed.     [name], MD [facility]

## 2019-11-25 NOTE — Patient Instructions (Signed)
-  Nice seeing you today!!  -Keep me updated on the status of your rheumatology and psychiatry appointments.  -Schedule follow up as needed.

## 2019-11-28 ENCOUNTER — Ambulatory Visit: Payer: BLUE CROSS/BLUE SHIELD | Admitting: Internal Medicine

## 2019-11-28 NOTE — Progress Notes (Deleted)
Name: Donna Leach  MRN/ DOB: [id number], 1991/04/05    Age/ Sex: 29 y.o., female     PCP: Isaac Bliss, Rayford Halsted, MD   Reason for Endocrinology Evaluation:  Abnormal thyroid ultrasound     Initial Endocrinology Clinic Visit:  11/26/2018    PATIENT IDENTIFIER: Donna Leach is a 29 y.o., female with an unremarkable past medical history. 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   HISTORICAL SUMMARY:  During evaluation of cervical lymphadenopathy and 10/2018 and ultrasound was ordered which showed a slightly atrophic and mildly heterogeneous thyroid gland, no worrisome nodules or masses were noted, this was thought to be a nonspecific finding of thyroiditis.  Her drab and anti-TPO antibodies were negative in 11/2018  SUBJECTIVE:    Today (11/28/2019):  Donna Leach is here for a follow-up on thyromegaly.   ROS:  As per HPI.   HISTORY:  Past Medical History:  Past Medical History:  Diagnosis Date  . Depression   . No pertinent past medical history     Past Surgical History:  Past Surgical History:  Procedure Laterality Date  . Adnoids    . APPENDECTOMY       Social History:  reports that she quit smoking about 3 years ago. She quit after 2.00 years of use. She has never used smokeless tobacco. She reports current alcohol use of about 1.0 standard drinks of alcohol per week. She reports that she does not use drugs. Family History:  Family History  Problem Relation Age of Onset  . Hypertension Father   . Diabetes Father   . Diabetes Mother   . Hyperthyroidism Other       HOME MEDICATIONS: Allergies as of 11/28/2019      Reactions   Amoxicillin Hives   Penicillins Hives   Has patient had a PCN reaction causing immediate rash, facial/tongue/throat swelling, SOB or lightheadedness with hypotension: Y Has patient had a PCN reaction causing severe rash  involving mucus membranes or skin necrosis: Y Has patient had a PCN reaction that required hospitalization: N Has patient had a PCN reaction occurring within the last 10 years: N If all of the above answers are "NO", then may proceed with Cephalosporin use.   Suprax [cefixime]       Medication List       Accurate as of November 28, 2019 12:46 PM. If you have any questions, ask your nurse or doctor.        B-12 PO Take by mouth.   clobetasol cream 0.05 % Commonly known as: TEMOVATE Apply topically 2 (two) times daily.   etonogestrel 68 MG Impl implant Commonly known as: NEXPLANON Inject into the skin.   ketoconazole 2 % shampoo Commonly known as: NIZORAL APPLY ONCE DAILY TO SCALP ONCE DAILY FOR ONE WEEK THEN DECREASE TO ONCE A WEEK   VITAMIN C PO Take by mouth.   ZINC PO Take by mouth.         OBJECTIVE:   PHYSICAL EXAM: VS: There were no vitals taken for this visit.   EXAM: General: Pt appears well and is in NAD  Hydration: Well-hydrated with moist mucous membranes and good skin turgor  Eyes: External eye exam normal without stare, lid lag or exophthalmos.  EOM intact.  PERRL.  Ears, Nose, Throat: Hearing: Grossly intact bilaterally Dental: Good dentition  Throat: Clear without mass, erythema or exudate  Neck: General: Supple without adenopathy. Thyroid: Thyroid  size normal.  No goiter or nodules appreciated. No thyroid bruit.  Lungs: Clear with good BS bilat with no rales, rhonchi, or wheezes  Heart: Auscultation: RRR.  Abdomen: Normoactive bowel sounds, soft, nontender, without masses or organomegaly palpable  Extremities: Gait and station: Normal gait  Digits and nails: No clubbing, cyanosis, petechiae, or nodes Head and neck: Normal alignment and mobility BL UE: Normal ROM and strength. BL LE: No pretibial edema normal ROM and strength.  Skin: Hair: Texture and amount normal with gender appropriate distribution Skin Inspection: No rashes, acanthosis  nigricans/skin tags. No lipohypertrophy Skin Palpation: Skin temperature, texture, and thickness normal to palpation  Neuro: Cranial nerves: II - XII grossly intact  Cerebellar: Normal coordination and movement; no tremor Motor: Normal strength throughout DTRs: 2+ and symmetric in UE without delay in relaxation phase  Mental Status: Judgment, insight: Intact Orientation: Oriented to time, place, and person Memory: Intact for recent and remote events Mood and affect: No depression, anxiety, or agitation     DATA REVIEWED: ***    ASSESSMENT / PLAN / RECOMMENDATIONS:   1.  Thyromegaly:  Plan:  ***    Medications   ***   Signed electronically by: Lyndle Herrlich, MD  [facility]  [facility] [address], [address] [place], [place] [address] Phone: [phone] FAX: [phone]      CC: Philip Aspen, Limmie Patricia, MD [address] [place] [address] Phone: [phone]  Fax: [phone]   Return to Endocrinology clinic as below: Future Appointments  Date Time Provider Department Center  11/28/2019  1:20 PM Briar Sword, Konrad Dolores, MD [id number] None

## 2019-12-08 ENCOUNTER — Ambulatory Visit: Payer: BC Managed Care – PPO | Admitting: Internal Medicine

## 2019-12-16 ENCOUNTER — Ambulatory Visit: Payer: BC Managed Care – PPO | Admitting: Internal Medicine

## 2019-12-16 NOTE — Progress Notes (Deleted)
Name: Donna Leach  MRN/ DOB: [id number], 25-May-1991    Age/ Sex: 29 y.o., female     PCP: Philip Aspen, Limmie Patricia, MD   Reason for Endocrinology Evaluation:  Abnormal thyroid ultrasound     Initial Endocrinology Clinic Visit:  11/26/2018    PATIENT IDENTIFIER: [name] is a 29 y.o., female with an unremarkable past medical history. She has followed with [facility] since 11/26/2018 for consultative assistance with management of her thyromegaly.   HISTORICAL SUMMARY:  During evaluation of cervical lymphadenopathy and 10/2018 and ultrasound was ordered which showed a slightly atrophic and mildly heterogeneous thyroid gland, no worrisome nodules or masses were noted, this was thought to be a nonspecific finding of thyroiditis.  Her TRab and anti-TPO antibodies were negative in 11/2018  SUBJECTIVE:    Today (12/16/2019):  Donna Leach is here for a follow-up on thyromegaly.   ROS:  As per HPI.   HISTORY:  Past Medical History:  Past Medical History:  Diagnosis Date  . Depression   . No pertinent past medical history    Past Surgical History:  Past Surgical History:  Procedure Laterality Date  . Adnoids    . APPENDECTOMY      Social History:  reports that she quit smoking about 3 years ago. She quit after 2.00 years of use. She has never used smokeless tobacco. She reports current alcohol use of about 1.0 standard drinks of alcohol per week. She reports that she does not use drugs. Family History:  Family History  Problem Relation Age of Onset  . Hypertension Father   . Diabetes Father   . Diabetes Mother   . Hyperthyroidism Other      HOME MEDICATIONS: Allergies as of 12/16/2019      Reactions   Amoxicillin Hives   Penicillins Hives   Has patient had a PCN reaction causing immediate rash, facial/tongue/throat swelling, SOB or lightheadedness with hypotension: Y Has patient had a PCN reaction causing severe rash involving  mucus membranes or skin necrosis: Y Has patient had a PCN reaction that required hospitalization: N Has patient had a PCN reaction occurring within the last 10 years: N If all of the above answers are "NO", then may proceed with Cephalosporin use.   Suprax [cefixime]       Medication List       Accurate as of December 16, 2019  7:12 AM. If you have any questions, ask your nurse or doctor.        B-12 PO Take by mouth.   clobetasol cream 0.05 % Commonly known as: TEMOVATE Apply topically 2 (two) times daily.   etonogestrel 68 MG Impl implant Commonly known as: NEXPLANON Inject into the skin.   ketoconazole 2 % shampoo Commonly known as: NIZORAL APPLY ONCE DAILY TO SCALP ONCE DAILY FOR ONE WEEK THEN DECREASE TO ONCE A WEEK   VITAMIN C PO Take by mouth.   ZINC PO Take by mouth.         OBJECTIVE:   PHYSICAL EXAM: VS: There were no vitals taken for this visit.   EXAM: General: Pt appears well and is in NAD  Hydration: Well-hydrated with moist mucous membranes and good skin turgor  Eyes: External eye exam normal without stare, lid lag or exophthalmos.  EOM intact.  PERRL.  Ears, Nose, Throat: Hearing: Grossly intact bilaterally Dental: Good dentition  Throat: Clear without mass, erythema or exudate  Neck: General: Supple without adenopathy. Thyroid: Thyroid size normal.  No goiter or nodules appreciated. No thyroid bruit.  Lungs: Clear with good BS bilat with no rales, rhonchi, or wheezes  Heart: Auscultation: RRR.  Abdomen: Normoactive bowel sounds, soft, nontender, without masses or organomegaly palpable  Extremities: Gait and station: Normal gait  Digits and nails: No clubbing, cyanosis, petechiae, or nodes Head and neck: Normal alignment and mobility BL UE: Normal ROM and strength. BL LE: No pretibial edema normal ROM and strength.  Skin: Hair: Texture and amount normal with gender appropriate distribution Skin Inspection: No rashes, acanthosis nigricans/skin  tags. No lipohypertrophy Skin Palpation: Skin temperature, texture, and thickness normal to palpation  Neuro: Cranial nerves: II - XII grossly intact  Cerebellar: Normal coordination and movement; no tremor Motor: Normal strength throughout DTRs: 2+ and symmetric in UE without delay in relaxation phase  Mental Status: Judgment, insight: Intact Orientation: Oriented to time, place, and person Memory: Intact for recent and remote events Mood and affect: No depression, anxiety, or agitation     DATA REVIEWED: ***    ASSESSMENT / PLAN / RECOMMENDATIONS:   1.  Thyromegaly:  Plan:  ***    Medications   ***   Signed electronically by: Mack Guise, MD  [facility]  [facility] [address] [place], [place] [address] Phone: [phone] FAX: [phone]      CC: Isaac Bliss, Rayford Halsted, MD [place] [address] Phone: [phone]  Fax: [phone]   Return to Endocrinology clinic as below: Future Appointments  Date Time Provider [place]  12/16/2019 11:10 AM Stephaney Steven, Melanie Crazier, MD [facility] None

## 2019-12-30 ENCOUNTER — Ambulatory Visit: Payer: BC Managed Care – PPO | Admitting: Internal Medicine

## 2020-01-04 ENCOUNTER — Encounter: Payer: Self-pay | Admitting: Internal Medicine

## 2020-01-04 ENCOUNTER — Ambulatory Visit (INDEPENDENT_AMBULATORY_CARE_PROVIDER_SITE_OTHER): Payer: BC Managed Care – PPO | Admitting: Internal Medicine

## 2020-01-04 ENCOUNTER — Ambulatory Visit: Payer: BC Managed Care – PPO | Admitting: Internal Medicine

## 2020-01-04 ENCOUNTER — Other Ambulatory Visit: Payer: Self-pay

## 2020-01-04 VITALS — BP 116/64 | HR 75 | Temp 98.5°F | Ht 68.0 in | Wt 135.4 lb

## 2020-01-04 DIAGNOSIS — E010 Iodine-deficiency related diffuse (endemic) goiter: Secondary | ICD-10-CM

## 2020-01-04 LAB — T4, FREE: Free T4: 0.75 ng/dL (ref 0.60–1.60)

## 2020-01-04 LAB — TSH: TSH: 1.56 u[IU]/mL (ref 0.35–4.50)

## 2020-01-04 NOTE — Progress Notes (Signed)
Name: Donna Leach  MRN/ DOB: [id number], 09-12-1990    Age/ Sex: 29 y.o., female     PCP: Isaac Bliss, Rayford Halsted, MD   Reason for Endocrinology Evaluation:  Abnormal thyroid ultrasound     Initial Endocrinology Clinic Visit:  11/26/2018    PATIENT IDENTIFIER: Donna Leach is a 29 y.o., female with an unremarkable past medical history. She has followed with [facility] since 11/26/2018 for consultative assistance with management of her thyromegaly.   HISTORICAL SUMMARY:  During evaluation of cervical lymphadenopathy and 10/2018 and ultrasound was ordered which showed a slightly atrophic and mildly heterogeneous thyroid gland, no worrisome nodules or masses were noted, this was thought to be a nonspecific finding of thyroiditis.  Her TRab and anti-TPO antibodies were negative in 11/2018  SUBJECTIVE:   Today (01/04/2020):  Donna Leach is here for a follow-up on thyromegaly. Since her last visit she has noted minimal enlargement of her thyroid gland. She denies pain or other local neck symptoms. Has been told has lymphadenopathy.    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  Weight has been stable       ROS:  As per HPI.   HISTORY:  Past Medical History:  Past Medical History:  Diagnosis Date  . Depression   . No pertinent past medical history    Past Surgical History:  Past Surgical History:  Procedure Laterality Date  . Adnoids    . APPENDECTOMY      Social History:  reports that she quit smoking about 3 years ago. She quit after 2.00 years of use. She has never used smokeless tobacco. She reports current alcohol use of about 1.0 standard drinks of alcohol per week. She reports that she does not use drugs. Family History:  Family History  Problem Relation Age of Onset  . Hypertension Father   . Diabetes Father   . Diabetes Mother   . Hyperthyroidism Other      HOME MEDICATIONS: Allergies as of 01/04/2020       Reactions   Amoxicillin Hives   Penicillins Hives   Has patient had a PCN reaction causing immediate rash, facial/tongue/throat swelling, SOB or lightheadedness with hypotension: Y Has patient had a PCN reaction causing severe rash involving mucus membranes or skin necrosis: Y Has patient had a PCN reaction that required hospitalization: N Has patient had a PCN reaction occurring within the last 10 years: N If all of the above answers are "NO", then may proceed with Cephalosporin use.   Suprax [cefixime]       Medication List       Accurate as of January 04, 2020  2:35 PM. If you have any questions, ask your nurse or doctor.        B-12 PO Take by mouth.   clobetasol cream 0.05 % Commonly known as: TEMOVATE Apply topically 2 (two) times daily.   etonogestrel 68 MG Impl implant Commonly known as: NEXPLANON Inject into the skin.   ketoconazole 2 % shampoo Commonly known as: NIZORAL APPLY ONCE DAILY TO SCALP ONCE DAILY FOR ONE WEEK THEN DECREASE TO ONCE A WEEK   VITAMIN C PO Take by mouth.   ZINC PO Take by mouth.         OBJECTIVE:   PHYSICAL EXAM: VS: BP 116/64 (BP Location: Left Arm, Patient Position: Sitting, Cuff Size: Normal)   Pulse 75   Temp 98.5 F (36.9 C)   Ht 5\' 8"  (1.727  m)   Wt 135 lb 6.4 oz (61.4 kg)   SpO2 98%   BMI 20.59 kg/m    EXAM: General: Pt appears well and is in NAD  Neck: General: Supple without adenopathy. Thyroid: Thyroid size normal.  No goiter or nodules appreciated. No thyroid bruit.  Lungs: Clear with good BS bilat with no rales, rhonchi, or wheezes  Heart: Auscultation: RRR.  Abdomen: Normoactive bowel sounds, soft, nontender, without masses or organomegaly palpable  Extremities:  BL LE: No pretibial edema normal ROM and strength.  Mental Status: Judgment, insight: Intact Orientation: Oriented to time, place, and person Mood and affect: No depression, anxiety, or agitation     DATA REVIEWED:  Results for  Donna, Leach (MRN [id number]) as of 01/05/2020 09:19  Ref. Range 01/04/2020 14:50  TSH Latest Ref Range: 0.35 - 4.50 uIU/mL 1.56  T4,Free(Direct) Latest Ref Range: 0.60 - 1.60 ng/dL 5.32    ASSESSMENT / PLAN / RECOMMENDATIONS:   1.  Thyromegaly:  - Pt is clinically and biochemically euthyroid  - No local neck symptoms - Reassurance provided at this time.    F/u PRN    Signed electronically by: Lyndle Herrlich, MD  [facility]  [facility] [address] [place], [place] [address] Phone: [phone] FAX: [phone]      CC: Philip Aspen, Limmie Patricia, MD [address] [place] [address] Phone: [phone]  Fax: [phone]   Return to Endocrinology clinic as below: No future appointments.

## 2020-01-04 NOTE — Patient Instructions (Signed)
Please stop by the lab

## 2020-01-04 NOTE — Progress Notes (Deleted)
Name: Donna Leach  MRN/ DOB: [id number], [birth year]    Age/ Sex: 29 y.o., female     PCP: Isaac Bliss, Rayford Halsted, MD   Reason for Endocrinology Evaluation:  Abnormal thyroid ultrasound     Initial Endocrinology Clinic Visit:  11/26/2018    PATIENT IDENTIFIER: [name] is a 29 y.o., female with an unremarkable past medical history. She has followed with [facility] since 11/26/2018 for consultative assistance with management of her thyromegaly.   HISTORICAL SUMMARY:  During evaluation of cervical lymphadenopathy and 10/2018 and ultrasound was ordered which showed a slightly atrophic and mildly heterogeneous thyroid gland, no worrisome nodules or masses were noted, this was thought to be a nonspecific finding of thyroiditis.  Her TRab and anti-TPO antibodies were negative in 11/2018  SUBJECTIVE:   Today (01/04/2020):  [name] is here for a follow-up on thyromegaly.      ROS:  As per HPI.   HISTORY:  Past Medical History:  Past Medical History:  Diagnosis Date  . Depression   . No pertinent past medical history    Past Surgical History:  Past Surgical History:  Procedure Laterality Date  . Adnoids    . APPENDECTOMY      Social History:  reports that she quit smoking about 3 years ago. She quit after 2.00 years of use. She has never used smokeless tobacco. She reports current alcohol use of about 1.0 standard drinks of alcohol per week. She reports that she does not use drugs. Family History:  Family History  Problem Relation Age of Onset  . Hypertension Father   . Diabetes Father   . Diabetes Mother   . Hyperthyroidism Other      HOME MEDICATIONS: Allergies as of 01/04/2020      Reactions   Amoxicillin Hives   Penicillins Hives   Has patient had a PCN reaction causing immediate rash, facial/tongue/throat swelling, SOB or lightheadedness with hypotension: Y Has patient had a PCN reaction causing severe rash  involving mucus membranes or skin necrosis: Y Has patient had a PCN reaction that required hospitalization: N Has patient had a PCN reaction occurring within the last 10 years: N If all of the above answers are "NO", then may proceed with Cephalosporin use.   Suprax [cefixime]       Medication List       Accurate as of January 04, 2020 12:43 PM. If you have any questions, ask your nurse or doctor.        B-12 PO Take by mouth.   clobetasol cream 0.05 % Commonly known as: TEMOVATE Apply topically 2 (two) times daily.   etonogestrel 68 MG Impl implant Commonly known as: NEXPLANON Inject into the skin.   ketoconazole 2 % shampoo Commonly known as: NIZORAL APPLY ONCE DAILY TO SCALP ONCE DAILY FOR ONE WEEK THEN DECREASE TO ONCE A WEEK   VITAMIN C PO Take by mouth.   ZINC PO Take by mouth.         OBJECTIVE:   PHYSICAL EXAM: VS: There were no vitals taken for this visit.   EXAM: General: Pt appears well and is in NAD  Neck: General: Supple without adenopathy. Thyroid: Thyroid size normal.  No goiter or nodules appreciated. No thyroid bruit.  Lungs: Clear with good BS bilat with no rales, rhonchi, or wheezes  Heart: Auscultation: RRR.  Abdomen: Normoactive bowel sounds, soft, nontender, without masses or organomegaly palpable  Extremities:  BL LE: No pretibial edema  normal ROM and strength.  Neuro: Cranial nerves: II - XII grossly intact  Motor: Normal strength throughout DTRs: 2+ and symmetric in UE without delay in relaxation phase  Mental Status: Judgment, insight: Intact Orientation: Oriented to time, place, and person Mood and affect: No depression, anxiety, or agitation     DATA REVIEWED: ***    ASSESSMENT / PLAN / RECOMMENDATIONS:   1.  Thyromegaly:  Plan:  ***    Medications   ***   Signed electronically by: Lyndle Herrlich, MD  [facility] End[facility]  [facility] [address] [place], [place]  [address] Phone: [phone] FAX: [phone]      CC: Philip Aspen, Limmie Patricia, MD [address] [place] [address] Phone: [phone]  Fax: [phone]   Return to Endocrinology clinic as below: Future Appointments  Date Time Provider Department Center  01/04/2020  1:00 PM Monicia Tse, Konrad Dolores, MD [facility] None

## 2020-01-16 ENCOUNTER — Other Ambulatory Visit: Payer: Self-pay

## 2020-01-16 DIAGNOSIS — J342 Deviated nasal septum: Secondary | ICD-10-CM | POA: Diagnosis not present

## 2020-01-16 DIAGNOSIS — J302 Other seasonal allergic rhinitis: Secondary | ICD-10-CM | POA: Diagnosis not present

## 2020-01-16 DIAGNOSIS — J3489 Other specified disorders of nose and nasal sinuses: Secondary | ICD-10-CM | POA: Diagnosis not present

## 2020-01-16 DIAGNOSIS — J343 Hypertrophy of nasal turbinates: Secondary | ICD-10-CM | POA: Diagnosis not present

## 2020-01-17 ENCOUNTER — Telehealth (INDEPENDENT_AMBULATORY_CARE_PROVIDER_SITE_OTHER): Payer: BC Managed Care – PPO | Admitting: Internal Medicine

## 2020-01-17 DIAGNOSIS — M79641 Pain in right hand: Secondary | ICD-10-CM

## 2020-01-17 DIAGNOSIS — M79642 Pain in left hand: Secondary | ICD-10-CM

## 2020-01-17 NOTE — Progress Notes (Signed)
Virtual Visit via Video Note  I connected with Donna Leach on 01/17/20 at  2:30 PM EDT by a video enabled telemedicine application and verified that I am speaking with the correct person using two identifiers.  Location patient: home Location provider: work office Persons participating in the virtual visit: patient, provider  I discussed the limitations of evaluation and management by telemedicine and the availability of in person appointments. The patient expressed understanding and agreed to proceed.   HPI: She continues to have pain and color changes of the hand when in a dependant position. She believes she may have caused damage last winter when exposed to the cold without gloves. At times will have numbness of her fingers. She does not use a computer excessively. I offered rheumatology referral last visit but she was trying to get into rheum in [place] where she is now living. Since I last saw her, she has seen ENT for her salivary glands and a CT head and neck has been ordered. She has been released from endocrine in regards to her thyromegaly (TFTs have bene normal).   ROS: Constitutional: Denies fever, chills, diaphoresis, appetite change and fatigue.  HEENT: Denies photophobia, eye pain, redness, hearing loss, ear pain, congestion, sore throat, rhinorrhea, sneezing, mouth sores, trouble swallowing, neck pain, neck stiffness and tinnitus.   Respiratory: Denies SOB, DOE, cough, chest tightness,  and wheezing.   Cardiovascular: Denies chest pain, palpitations and leg swelling.  Gastrointestinal: Denies nausea, vomiting, abdominal pain, diarrhea, constipation, blood in stool and abdominal distention.  Genitourinary: Denies dysuria, urgency, frequency, hematuria, flank pain and difficulty urinating.  Endocrine: Denies: hot or cold intolerance, sweats, changes in hair or nails, polyuria, polydipsia. Musculoskeletal: Denies myalgias, back pain, joint swelling, arthralgias  and gait problem.  Skin: Denies pallor, rash and wound.  Neurological: Denies dizziness, seizures, syncope, weakness, light-headedness, numbness and headaches.  Hematological: Denies adenopathy. Easy bruising, personal or family bleeding history  Psychiatric/Behavioral: Denies suicidal ideation, mood changes, confusion, nervousness, sleep disturbance and agitation   Past Medical History:  Diagnosis Date  . Depression   . No pertinent past medical history     Past Surgical History:  Procedure Laterality Date  . Adnoids    . APPENDECTOMY      Family History  Problem Relation Age of Onset  . Hypertension Father   . Diabetes Father   . Diabetes Mother   . Hyperthyroidism Other     SOCIAL HX:   reports that she quit smoking about 3 years ago. She quit after 2.00 years of use. She has never used smokeless tobacco. She reports current alcohol use of about 1.0 standard drinks of alcohol per week. She reports that she does not use drugs.   Current Outpatient Medications:  .  Ascorbic Acid (VITAMIN C PO), Take by mouth., Disp: , Rfl:  .  clobetasol cream (TEMOVATE) 0.05 %, Apply topically 2 (two) times daily., Disp: , Rfl:  .  Cyanocobalamin (B-12 PO), Take by mouth., Disp: , Rfl:  .  etonogestrel (NEXPLANON) 68 MG IMPL implant, Inject into the skin., Disp: , Rfl:  .  ketoconazole (NIZORAL) 2 % shampoo, APPLY ONCE DAILY TO SCALP ONCE DAILY FOR ONE WEEK THEN DECREASE TO ONCE A WEEK, Disp: , Rfl:  .  Multiple Vitamins-Minerals (ZINC PO), Take by mouth., Disp: , Rfl:   EXAM:   VITALS per patient if applicable: none reported  GENERAL: alert, oriented, appears well and in no acute distress  HEENT: atraumatic, conjunttiva clear,  no obvious abnormalities on inspection of external nose and ears  NECK: normal movements of the head and neck  LUNGS: on inspection no signs of respiratory distress, breathing rate appears normal, no obvious gross increased work of breathing, gasping or  wheezing  CV: no obvious cyanosis  MS: moves all visible extremities without noticeable abnormality  PSYCH/NEURO: pleasant and cooperative, no obvious depression or anxiety, speech and thought processing grossly intact  ASSESSMENT AND PLAN:   Pain in both hands  -Unclear etiology. Doesn't seem to be Raynaud's. Doubt carpal tunnel. -Offered rhem referral in [place].     I discussed the assessment and treatment plan with the patient. The patient was provided an opportunity to ask questions and all were answered. The patient agreed with the plan and demonstrated an understanding of the instructions.   The patient was advised to call back or seek an in-person evaluation if the symptoms worsen or if the condition fails to improve as anticipated.    Lelon Frohlich, MD  [facility]

## 2020-02-02 DIAGNOSIS — M79645 Pain in left finger(s): Secondary | ICD-10-CM | POA: Diagnosis not present

## 2020-02-02 DIAGNOSIS — M79644 Pain in right finger(s): Secondary | ICD-10-CM | POA: Diagnosis not present

## 2020-02-02 DIAGNOSIS — I7300 Raynaud's syndrome without gangrene: Secondary | ICD-10-CM | POA: Diagnosis not present

## 2020-02-02 DIAGNOSIS — Z6820 Body mass index (BMI) 20.0-20.9, adult: Secondary | ICD-10-CM | POA: Diagnosis not present

## 2020-02-14 DIAGNOSIS — Z20822 Contact with and (suspected) exposure to covid-19: Secondary | ICD-10-CM | POA: Diagnosis not present

## 2020-02-14 DIAGNOSIS — Z03818 Encounter for observation for suspected exposure to other biological agents ruled out: Secondary | ICD-10-CM | POA: Diagnosis not present

## 2020-03-18 DIAGNOSIS — Z20822 Contact with and (suspected) exposure to covid-19: Secondary | ICD-10-CM | POA: Diagnosis not present

## 2020-03-30 DIAGNOSIS — J3489 Other specified disorders of nose and nasal sinuses: Secondary | ICD-10-CM | POA: Diagnosis not present

## 2020-03-30 DIAGNOSIS — H9319 Tinnitus, unspecified ear: Secondary | ICD-10-CM | POA: Diagnosis not present

## 2020-03-30 DIAGNOSIS — H9209 Otalgia, unspecified ear: Secondary | ICD-10-CM | POA: Diagnosis not present

## 2020-03-30 DIAGNOSIS — K111 Hypertrophy of salivary gland: Secondary | ICD-10-CM | POA: Diagnosis not present

## 2020-03-30 DIAGNOSIS — H9313 Tinnitus, bilateral: Secondary | ICD-10-CM | POA: Diagnosis not present

## 2020-04-06 DIAGNOSIS — R0989 Other specified symptoms and signs involving the circulatory and respiratory systems: Secondary | ICD-10-CM | POA: Diagnosis not present

## 2020-04-06 DIAGNOSIS — Z6820 Body mass index (BMI) 20.0-20.9, adult: Secondary | ICD-10-CM | POA: Diagnosis not present

## 2020-04-11 DIAGNOSIS — Z113 Encounter for screening for infections with a predominantly sexual mode of transmission: Secondary | ICD-10-CM | POA: Diagnosis not present

## 2020-04-11 DIAGNOSIS — E049 Nontoxic goiter, unspecified: Secondary | ICD-10-CM | POA: Diagnosis not present

## 2020-04-11 DIAGNOSIS — Z01419 Encounter for gynecological examination (general) (routine) without abnormal findings: Secondary | ICD-10-CM | POA: Diagnosis not present

## 2020-04-11 DIAGNOSIS — L679 Hair color and hair shaft abnormality, unspecified: Secondary | ICD-10-CM | POA: Diagnosis not present

## 2020-04-11 DIAGNOSIS — Z3040 Encounter for surveillance of contraceptives, unspecified: Secondary | ICD-10-CM | POA: Diagnosis not present

## 2020-04-11 DIAGNOSIS — Z6820 Body mass index (BMI) 20.0-20.9, adult: Secondary | ICD-10-CM | POA: Diagnosis not present

## 2020-04-24 DIAGNOSIS — R202 Paresthesia of skin: Secondary | ICD-10-CM | POA: Diagnosis not present

## 2020-04-24 DIAGNOSIS — M7918 Myalgia, other site: Secondary | ICD-10-CM | POA: Diagnosis not present

## 2020-04-24 DIAGNOSIS — M549 Dorsalgia, unspecified: Secondary | ICD-10-CM | POA: Diagnosis not present

## 2020-04-24 DIAGNOSIS — M542 Cervicalgia: Secondary | ICD-10-CM | POA: Diagnosis not present

## 2020-04-25 DIAGNOSIS — Z20822 Contact with and (suspected) exposure to covid-19: Secondary | ICD-10-CM | POA: Diagnosis not present

## 2020-04-25 DIAGNOSIS — Z03818 Encounter for observation for suspected exposure to other biological agents ruled out: Secondary | ICD-10-CM | POA: Diagnosis not present

## 2020-05-08 ENCOUNTER — Telehealth: Payer: Self-pay | Admitting: Internal Medicine

## 2020-05-08 DIAGNOSIS — Z87891 Personal history of nicotine dependence: Secondary | ICD-10-CM | POA: Diagnosis not present

## 2020-05-08 DIAGNOSIS — R000 Tachycardia, unspecified: Secondary | ICD-10-CM | POA: Diagnosis not present

## 2020-05-08 DIAGNOSIS — R079 Chest pain, unspecified: Secondary | ICD-10-CM | POA: Diagnosis not present

## 2020-05-08 DIAGNOSIS — U071 COVID-19: Secondary | ICD-10-CM | POA: Diagnosis not present

## 2020-05-08 DIAGNOSIS — M542 Cervicalgia: Secondary | ICD-10-CM | POA: Diagnosis not present

## 2020-05-08 DIAGNOSIS — I493 Ventricular premature depolarization: Secondary | ICD-10-CM | POA: Diagnosis not present

## 2020-05-08 DIAGNOSIS — R05 Cough: Secondary | ICD-10-CM | POA: Diagnosis not present

## 2020-05-08 DIAGNOSIS — Z20822 Contact with and (suspected) exposure to covid-19: Secondary | ICD-10-CM | POA: Diagnosis not present

## 2020-05-08 DIAGNOSIS — R0789 Other chest pain: Secondary | ICD-10-CM | POA: Diagnosis not present

## 2020-05-08 DIAGNOSIS — R509 Fever, unspecified: Secondary | ICD-10-CM | POA: Diagnosis not present

## 2020-05-08 NOTE — Telephone Encounter (Signed)
Pt is wondering if we test for lyme disease and if so if she can be tested?   Pt can be reached at [phone]

## 2020-05-09 DIAGNOSIS — R079 Chest pain, unspecified: Secondary | ICD-10-CM | POA: Diagnosis not present

## 2020-05-09 DIAGNOSIS — U071 COVID-19: Secondary | ICD-10-CM | POA: Diagnosis not present

## 2020-05-09 DIAGNOSIS — Z87891 Personal history of nicotine dependence: Secondary | ICD-10-CM | POA: Diagnosis not present

## 2020-05-09 DIAGNOSIS — R0789 Other chest pain: Secondary | ICD-10-CM | POA: Diagnosis not present

## 2020-05-09 NOTE — Telephone Encounter (Signed)
Left message on machine returning patient's call 

## 2020-05-09 NOTE — Telephone Encounter (Signed)
Why does she believe she needs to be tested for Lyme Disease? It is not endemic to [place], so unless she had a tick bite while up in the [place] would be a very low yield test..Marland Kitchen

## 2020-05-10 NOTE — Telephone Encounter (Signed)
Left message on machine for patient to return our call 

## 2020-05-11 DIAGNOSIS — R069 Unspecified abnormalities of breathing: Secondary | ICD-10-CM | POA: Diagnosis not present

## 2020-05-11 DIAGNOSIS — R0602 Shortness of breath: Secondary | ICD-10-CM | POA: Diagnosis not present

## 2020-05-11 DIAGNOSIS — R509 Fever, unspecified: Secondary | ICD-10-CM | POA: Diagnosis not present

## 2020-05-11 DIAGNOSIS — R197 Diarrhea, unspecified: Secondary | ICD-10-CM | POA: Diagnosis not present

## 2020-05-11 DIAGNOSIS — Z87891 Personal history of nicotine dependence: Secondary | ICD-10-CM | POA: Diagnosis not present

## 2020-05-11 DIAGNOSIS — Z8616 Personal history of COVID-19: Secondary | ICD-10-CM | POA: Diagnosis not present

## 2020-05-11 DIAGNOSIS — U071 COVID-19: Secondary | ICD-10-CM | POA: Diagnosis not present

## 2020-05-13 DIAGNOSIS — R0789 Other chest pain: Secondary | ICD-10-CM | POA: Diagnosis not present

## 2020-05-13 DIAGNOSIS — R52 Pain, unspecified: Secondary | ICD-10-CM | POA: Diagnosis not present

## 2020-05-13 DIAGNOSIS — Z8616 Personal history of COVID-19: Secondary | ICD-10-CM | POA: Diagnosis not present

## 2020-05-13 DIAGNOSIS — Z87891 Personal history of nicotine dependence: Secondary | ICD-10-CM | POA: Diagnosis not present

## 2020-05-13 DIAGNOSIS — I498 Other specified cardiac arrhythmias: Secondary | ICD-10-CM | POA: Diagnosis not present

## 2020-05-13 DIAGNOSIS — R079 Chest pain, unspecified: Secondary | ICD-10-CM | POA: Diagnosis not present

## 2020-05-13 DIAGNOSIS — U071 COVID-19: Secondary | ICD-10-CM | POA: Diagnosis not present

## 2020-05-13 DIAGNOSIS — R0602 Shortness of breath: Secondary | ICD-10-CM | POA: Diagnosis not present

## 2020-05-13 DIAGNOSIS — R0600 Dyspnea, unspecified: Secondary | ICD-10-CM | POA: Diagnosis not present

## 2020-05-15 DIAGNOSIS — E860 Dehydration: Secondary | ICD-10-CM | POA: Diagnosis not present

## 2020-05-15 DIAGNOSIS — I493 Ventricular premature depolarization: Secondary | ICD-10-CM | POA: Diagnosis not present

## 2020-05-15 DIAGNOSIS — R202 Paresthesia of skin: Secondary | ICD-10-CM | POA: Diagnosis not present

## 2020-05-15 DIAGNOSIS — R001 Bradycardia, unspecified: Secondary | ICD-10-CM | POA: Diagnosis not present

## 2020-05-15 DIAGNOSIS — R197 Diarrhea, unspecified: Secondary | ICD-10-CM | POA: Diagnosis not present

## 2020-05-15 DIAGNOSIS — R432 Parageusia: Secondary | ICD-10-CM | POA: Diagnosis not present

## 2020-05-15 DIAGNOSIS — R438 Other disturbances of smell and taste: Secondary | ICD-10-CM | POA: Diagnosis not present

## 2020-05-15 DIAGNOSIS — Z87891 Personal history of nicotine dependence: Secondary | ICD-10-CM | POA: Diagnosis not present

## 2020-05-15 DIAGNOSIS — Z79899 Other long term (current) drug therapy: Secondary | ICD-10-CM | POA: Diagnosis not present

## 2020-05-15 DIAGNOSIS — R42 Dizziness and giddiness: Secondary | ICD-10-CM | POA: Diagnosis not present

## 2020-05-15 DIAGNOSIS — Z880 Allergy status to penicillin: Secondary | ICD-10-CM | POA: Diagnosis not present

## 2020-05-15 DIAGNOSIS — U071 COVID-19: Secondary | ICD-10-CM | POA: Diagnosis not present

## 2020-05-15 DIAGNOSIS — F419 Anxiety disorder, unspecified: Secondary | ICD-10-CM | POA: Diagnosis not present

## 2020-05-15 DIAGNOSIS — R531 Weakness: Secondary | ICD-10-CM | POA: Diagnosis not present

## 2020-05-15 DIAGNOSIS — R430 Anosmia: Secondary | ICD-10-CM | POA: Diagnosis not present

## 2020-05-15 DIAGNOSIS — R0789 Other chest pain: Secondary | ICD-10-CM | POA: Diagnosis not present

## 2020-05-16 DIAGNOSIS — U071 COVID-19: Secondary | ICD-10-CM | POA: Diagnosis not present

## 2020-05-16 DIAGNOSIS — R42 Dizziness and giddiness: Secondary | ICD-10-CM | POA: Diagnosis not present

## 2020-05-16 DIAGNOSIS — H538 Other visual disturbances: Secondary | ICD-10-CM | POA: Diagnosis not present

## 2020-05-16 NOTE — Telephone Encounter (Signed)
Appointment scheduled.

## 2020-05-17 DIAGNOSIS — R001 Bradycardia, unspecified: Secondary | ICD-10-CM | POA: Diagnosis not present

## 2020-05-17 DIAGNOSIS — U071 COVID-19: Secondary | ICD-10-CM | POA: Diagnosis not present

## 2020-05-17 DIAGNOSIS — R0789 Other chest pain: Secondary | ICD-10-CM | POA: Diagnosis not present

## 2020-05-17 DIAGNOSIS — R432 Parageusia: Secondary | ICD-10-CM | POA: Diagnosis not present

## 2020-05-17 DIAGNOSIS — R430 Anosmia: Secondary | ICD-10-CM | POA: Diagnosis not present

## 2020-05-18 ENCOUNTER — Telehealth: Payer: BC Managed Care – PPO | Admitting: Internal Medicine

## 2020-05-21 ENCOUNTER — Telehealth: Payer: Self-pay | Admitting: Internal Medicine

## 2020-05-21 NOTE — Telephone Encounter (Signed)
Pt stated she was diagnosed with COVID on 8/31 and she has completed her quarantine. She is wanting to come in office to see if she has Lyme Disease b/c she believes she was bit by a tick 2 years ago and have been experiencing symptoms since then. I am sending a message back for approval to schedule an appt with a close COVID diagnoses date? Pt denied doing any virtual appts    Pt can be reached at [phone]

## 2020-05-21 NOTE — Telephone Encounter (Signed)
error 

## 2020-05-22 NOTE — Telephone Encounter (Signed)
Appointment scheduled.

## 2020-05-22 NOTE — Telephone Encounter (Signed)
Ok to schedule.

## 2020-05-23 DIAGNOSIS — G933 Postviral fatigue syndrome: Secondary | ICD-10-CM | POA: Diagnosis not present

## 2020-05-23 DIAGNOSIS — R3915 Urgency of urination: Secondary | ICD-10-CM | POA: Diagnosis not present

## 2020-05-23 DIAGNOSIS — M6281 Muscle weakness (generalized): Secondary | ICD-10-CM | POA: Diagnosis not present

## 2020-05-23 DIAGNOSIS — N39498 Other specified urinary incontinence: Secondary | ICD-10-CM | POA: Diagnosis not present

## 2020-05-24 ENCOUNTER — Other Ambulatory Visit: Payer: Self-pay

## 2020-05-25 ENCOUNTER — Ambulatory Visit: Payer: BC Managed Care – PPO | Admitting: Internal Medicine

## 2020-05-25 ENCOUNTER — Ambulatory Visit: Payer: BC Managed Care – PPO | Admitting: Rheumatology

## 2020-05-26 ENCOUNTER — Emergency Department (HOSPITAL_COMMUNITY): Payer: BC Managed Care – PPO

## 2020-05-26 ENCOUNTER — Other Ambulatory Visit: Payer: Self-pay

## 2020-05-26 ENCOUNTER — Encounter (HOSPITAL_COMMUNITY): Payer: Self-pay | Admitting: Emergency Medicine

## 2020-05-26 ENCOUNTER — Emergency Department (HOSPITAL_COMMUNITY)
Admission: EM | Admit: 2020-05-26 | Discharge: 2020-05-26 | Disposition: A | Discharge status: Home / Self Care | Payer: BC Managed Care – PPO | Attending: Emergency Medicine | Admitting: Emergency Medicine

## 2020-05-26 DIAGNOSIS — Z87891 Personal history of nicotine dependence: Secondary | ICD-10-CM | POA: Insufficient documentation

## 2020-05-26 DIAGNOSIS — Z8616 Personal history of COVID-19: Secondary | ICD-10-CM | POA: Insufficient documentation

## 2020-05-26 DIAGNOSIS — U071 COVID-19: Secondary | ICD-10-CM | POA: Diagnosis not present

## 2020-05-26 DIAGNOSIS — R072 Precordial pain: Secondary | ICD-10-CM | POA: Diagnosis not present

## 2020-05-26 DIAGNOSIS — R918 Other nonspecific abnormal finding of lung field: Secondary | ICD-10-CM | POA: Diagnosis not present

## 2020-05-26 DIAGNOSIS — R0789 Other chest pain: Secondary | ICD-10-CM

## 2020-05-26 DIAGNOSIS — R911 Solitary pulmonary nodule: Secondary | ICD-10-CM | POA: Diagnosis not present

## 2020-05-26 DIAGNOSIS — R079 Chest pain, unspecified: Secondary | ICD-10-CM

## 2020-05-26 LAB — TROPONIN I (HIGH SENSITIVITY): Troponin I (High Sensitivity): 2 ng/L (ref <18)

## 2020-05-26 LAB — HEPATIC FUNCTION PANEL
ALT: 14 U/L (ref 0–44)
AST: 13 U/L — ABNORMAL LOW (ref 15–41)
Albumin: 3.7 g/dL (ref 3.5–5.0)
Alkaline Phosphatase: 32 U/L — ABNORMAL LOW (ref 38–126)
Bilirubin, Direct: 0.2 mg/dL (ref 0.0–0.2)
Indirect Bilirubin: 1 mg/dL — ABNORMAL HIGH (ref 0.3–0.9)
Total Bilirubin: 1.2 mg/dL (ref 0.3–1.2)
Total Protein: 6.5 g/dL (ref 6.5–8.1)

## 2020-05-26 LAB — CBC
HCT: 39.2 % (ref 36.0–46.0)
Hemoglobin: 13.1 g/dL (ref 12.0–15.0)
MCH: 32.3 pg (ref 26.0–34.0)
MCHC: 33.4 g/dL (ref 30.0–36.0)
MCV: 96.8 fL (ref 80.0–100.0)
Platelets: 282 10*3/uL (ref 150–400)
RBC: 4.05 MIL/uL (ref 3.87–5.11)
RDW: 11.4 % — ABNORMAL LOW (ref 11.5–15.5)
WBC: 8.7 10*3/uL (ref 4.0–10.5)
nRBC: 0 % (ref 0.0–0.2)

## 2020-05-26 LAB — BASIC METABOLIC PANEL
Anion gap: 8 (ref 5–15)
BUN: 11 mg/dL (ref 6–20)
CO2: 28 mmol/L (ref 22–32)
Calcium: 9.3 mg/dL (ref 8.9–10.3)
Chloride: 105 mmol/L (ref 98–111)
Creatinine, Ser: 0.95 mg/dL (ref 0.44–1.00)
GFR calc Af Amer: >60 mL/min (ref >60)
GFR calc non Af Amer: >60 mL/min (ref >60)
Glucose, Bld: 107 mg/dL — ABNORMAL HIGH (ref 70–99)
Potassium: 3.7 mmol/L (ref 3.5–5.1)
Sodium: 141 mmol/L (ref 135–145)

## 2020-05-26 LAB — I-STAT BETA HCG BLOOD, ED (MC, WL, AP ONLY): I-stat hCG, quantitative: <5 m[IU]/mL (ref <5)

## 2020-05-26 LAB — LIPASE, BLOOD: Lipase: 36 U/L (ref 11–51)

## 2020-05-26 IMAGING — CR DG CHEST 2V
2 series · 2 of 2 positions shown · non-contrast
Comparison: None.

CLINICAL DATA: Chest pain, [M8] positive 20 days ago

EXAM:
CHEST - 2 VIEW

[chest pa]
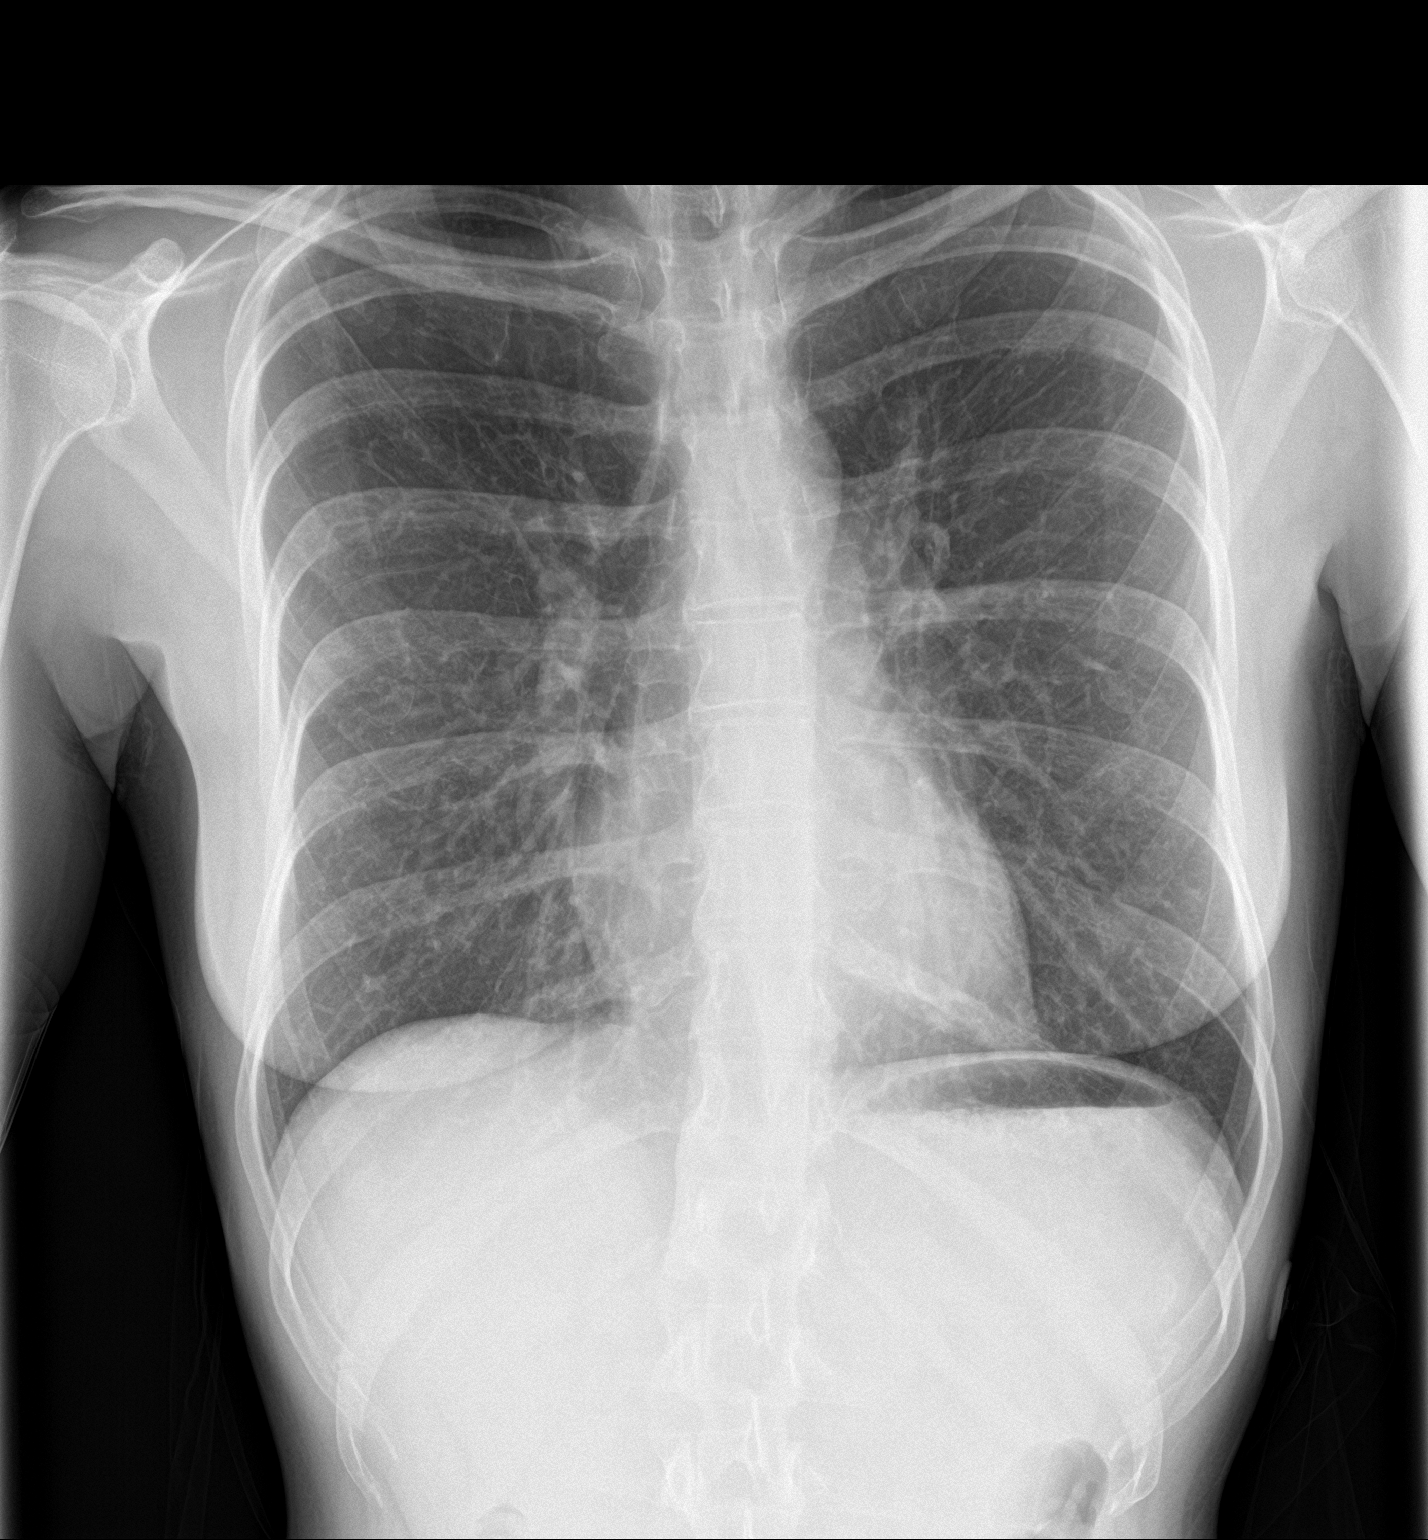

[chest lat]
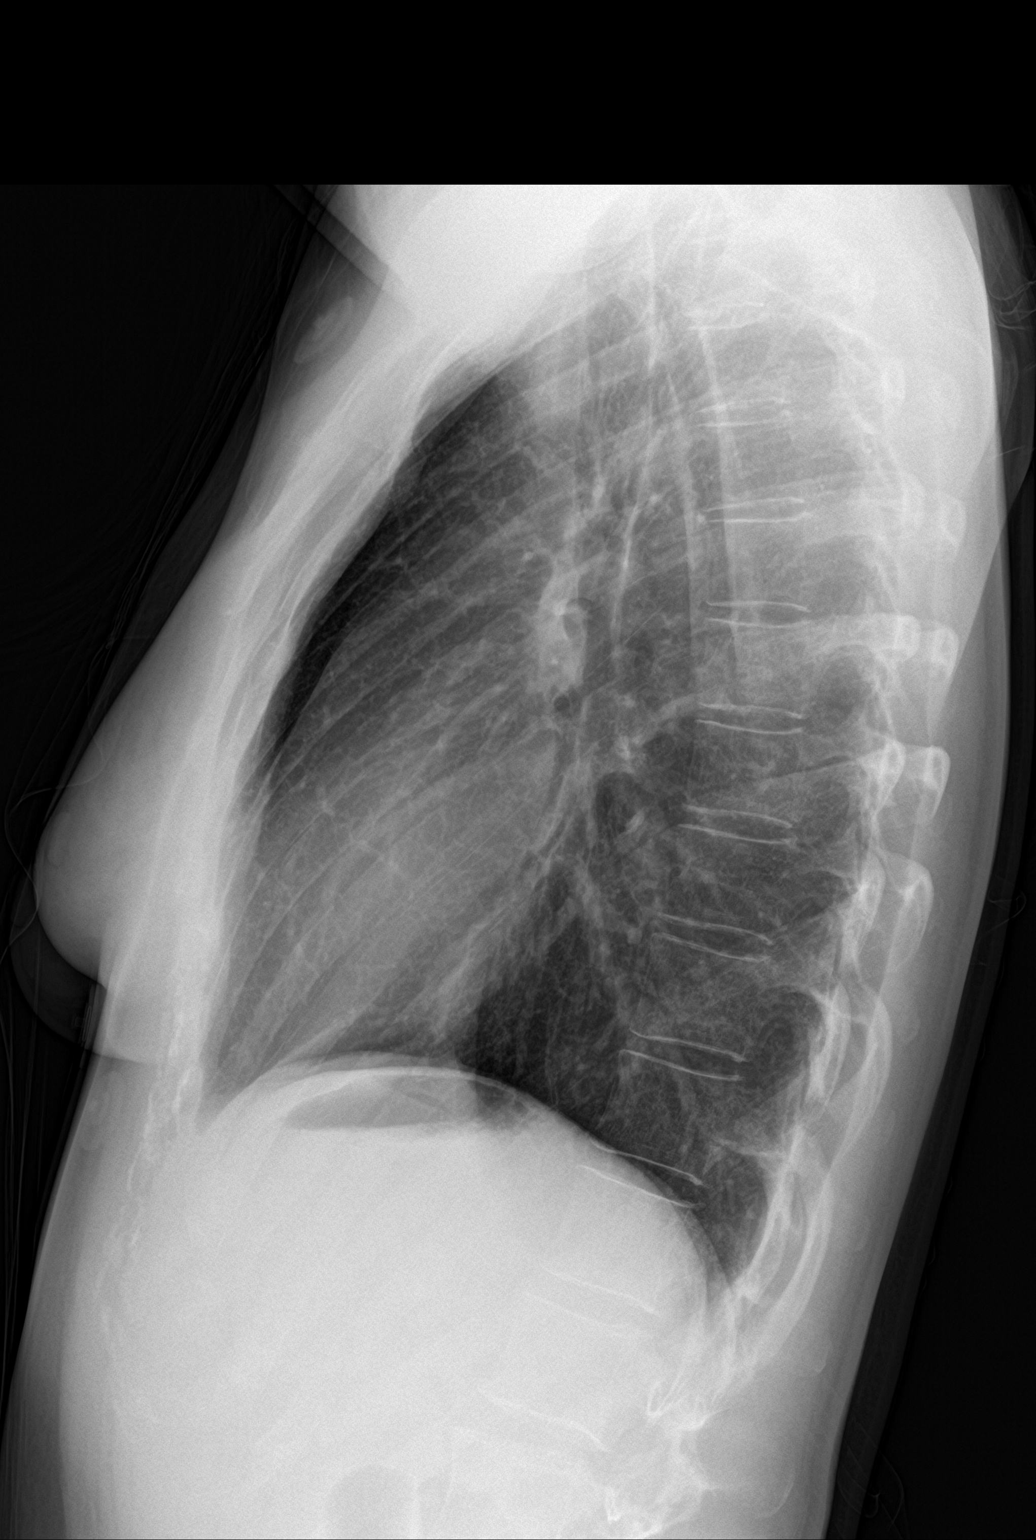

[2 of 2 positions shown; findings below may reference images not displayed]

FINDINGS: The heart size and mediastinal contours are within normal limits.
Both lungs are clear. The visualized skeletal structures are
unremarkable.
IMPRESSION: No active cardiopulmonary disease.

## 2020-05-26 IMAGING — CT CT ANGIO CHEST
2 of 7 series · 19 of 46 positions shown · IV contrast (APPLIED)
Comparison: None.

CLINICAL DATA: COVID positive, PE suspected

EXAM:
CT ANGIOGRAPHY CHEST WITH CONTRAST
TECHNIQUE: Multidetector CT imaging of the chest was performed using the
standard protocol during bolus administration of intravenous
contrast. Multiplanar CT image reconstructions and MIPs were
obtained to evaluate the vascular anatomy.
CONTRAST:  68mL OMNIPAQUE IOHEXOL 350 MG/ML SOLN

[Series 7: thins · axial · 0.60mm/px · z∈[+1189,+1457]mm · 16 of 431 slices shown]
[im 24/431  lung]
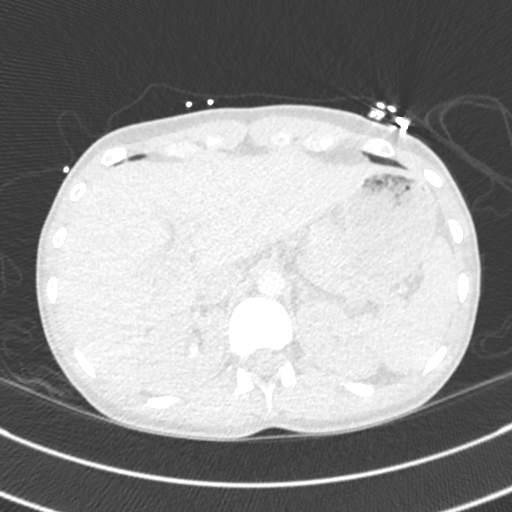
[im 48/431  soft-tissue]
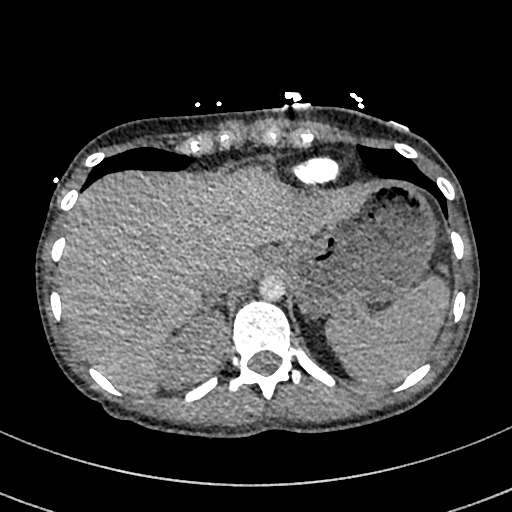
[im 72/431  lung]
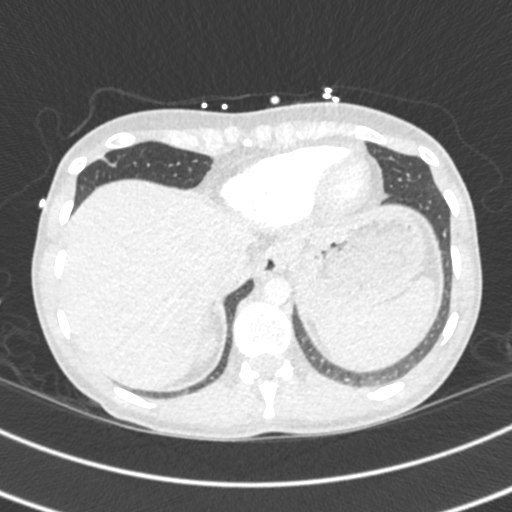
[im 96/431  soft-tissue]
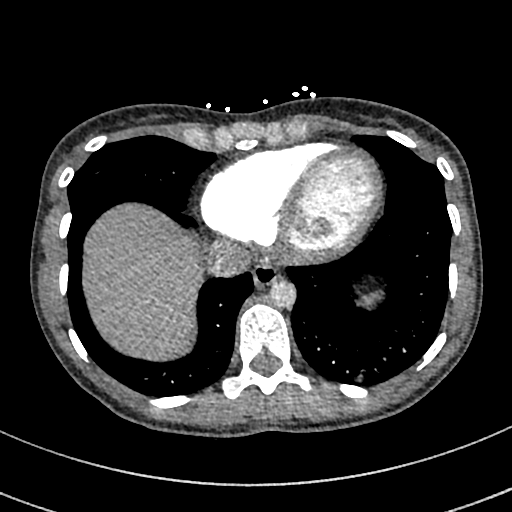
[im 120/431  lung]
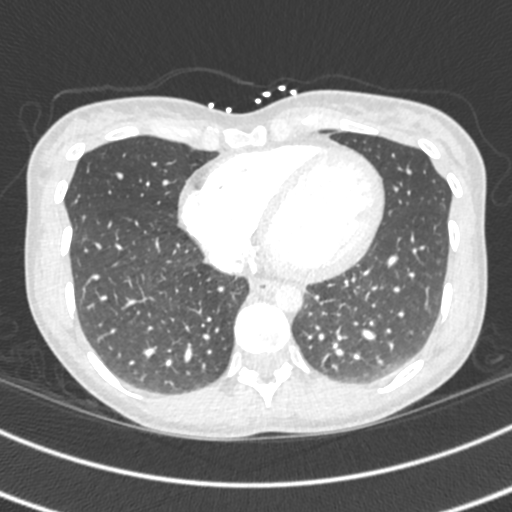
[im 144/431  soft-tissue]
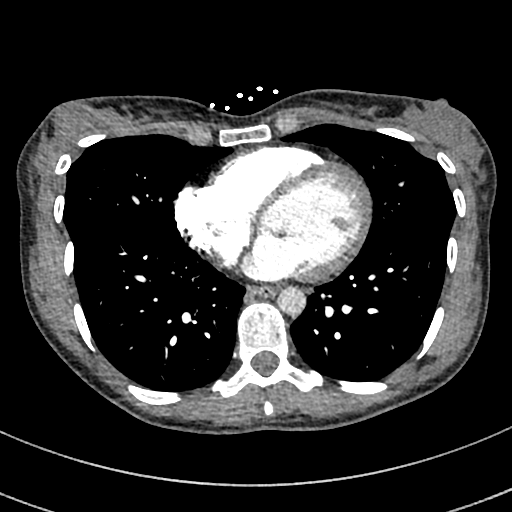
[im 168/431  lung]
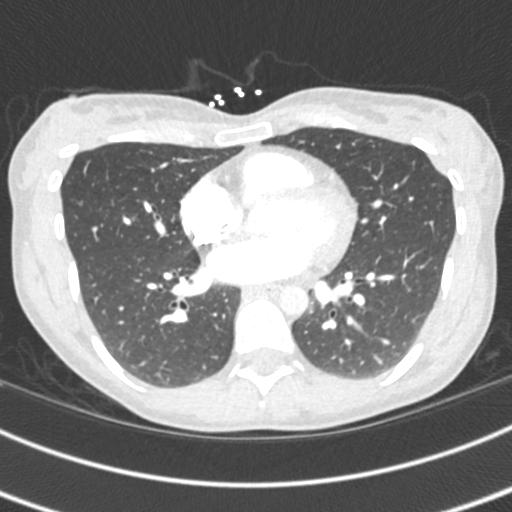
[im 192/431  soft-tissue]
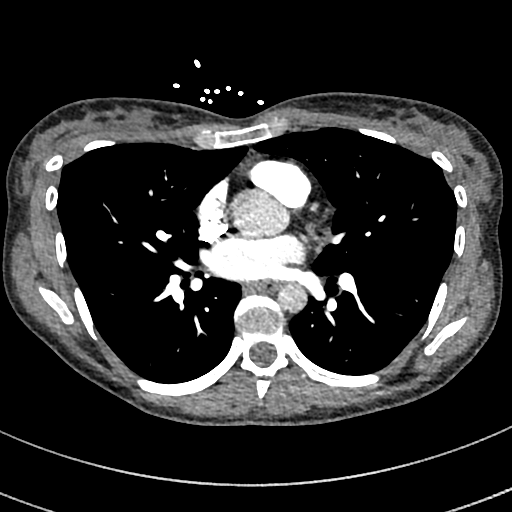
[im 239/431  lung]
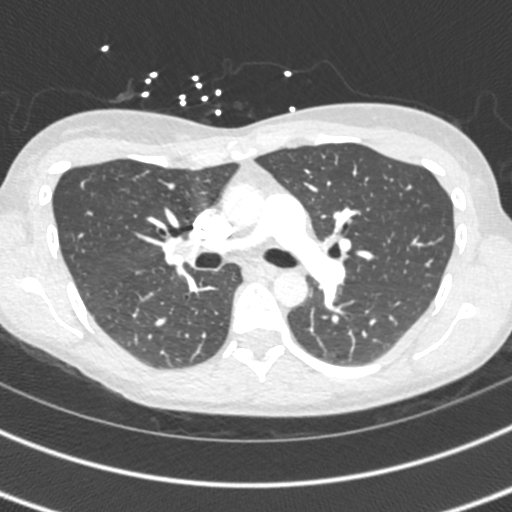
[im 263/431  soft-tissue]
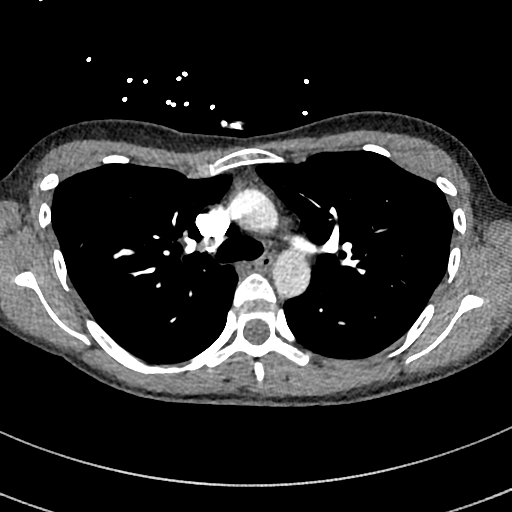
[im 287/431  lung]
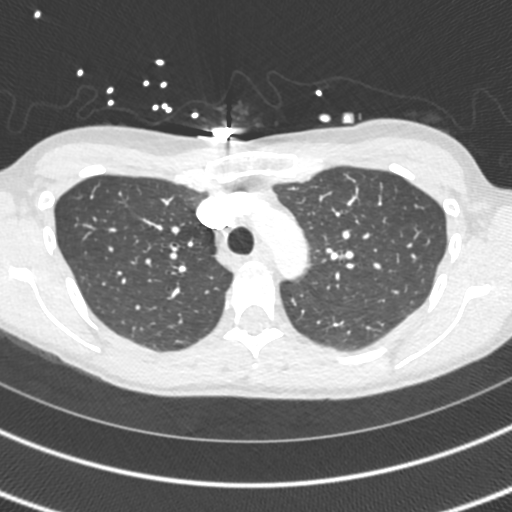
[im 311/431  soft-tissue]
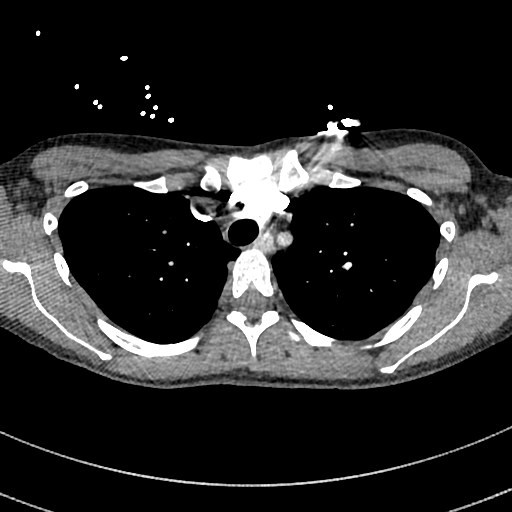
[im 335/431  lung]
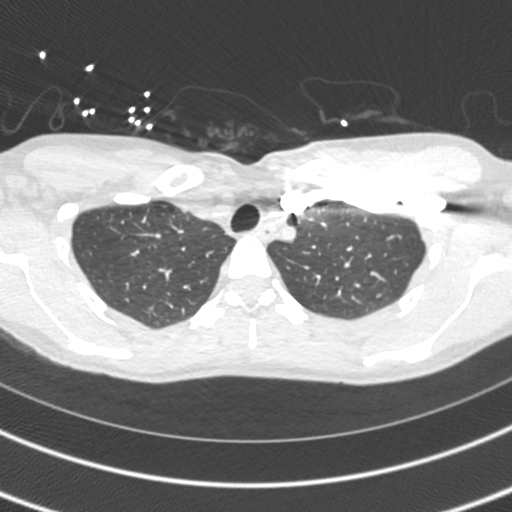
[im 359/431  soft-tissue]
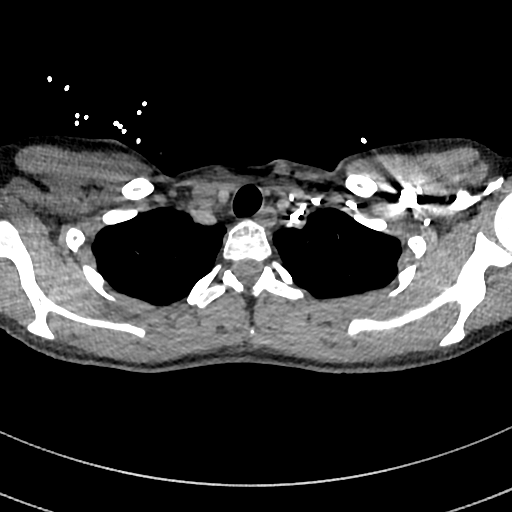
[im 383/431  lung]
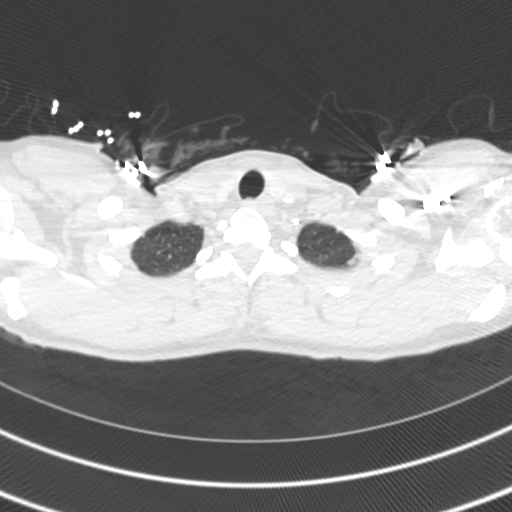
[im 407/431  soft-tissue]
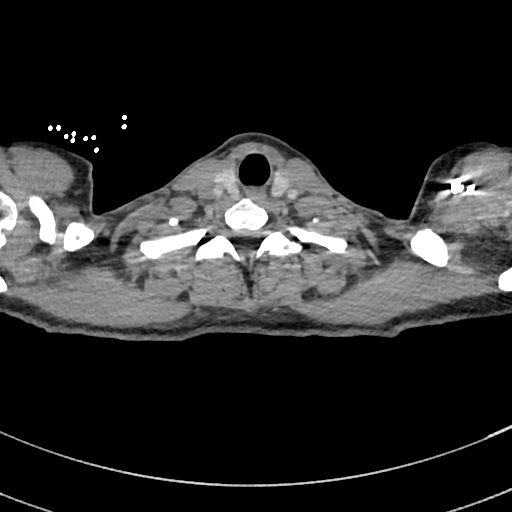

[Series 8: cor · coronal · 0.61mm/px · 3 of 90 slices shown]
[im 23/90  soft-tissue]
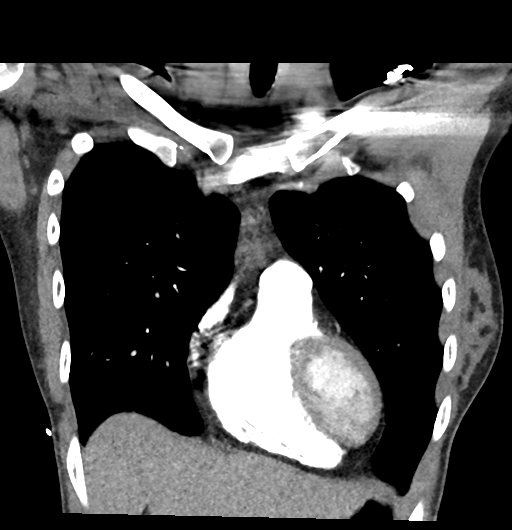
[im 45/90  soft-tissue]
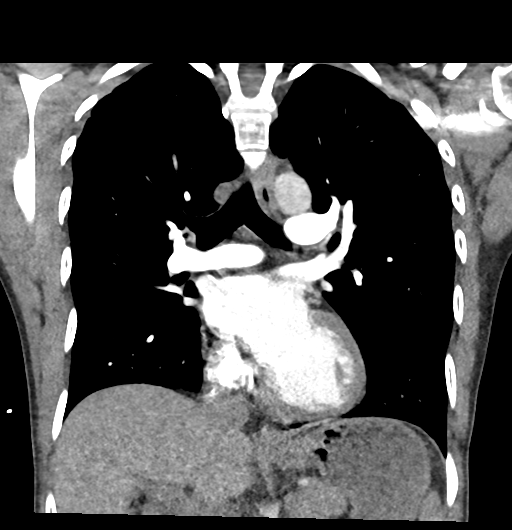
[im 67/90  soft-tissue]
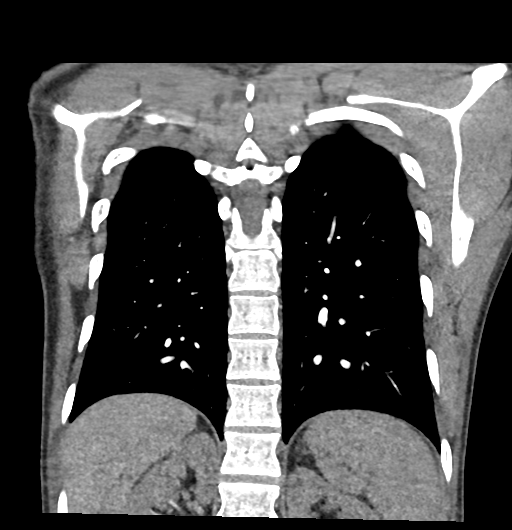

[19 of 46 positions shown; findings below may reference images not displayed]

FINDINGS: Cardiovascular: Satisfactory opacification of the pulmonary arteries
to the segmental level. No evidence of pulmonary embolism. Normal
heart size. No pericardial effusion.

Mediastinum/Nodes: No enlarged mediastinal, hilar, or axillary lymph
nodes. Thyroid gland, trachea, and esophagus demonstrate no
significant findings.

Lungs/Pleura: 5 mm nodule of the dependent left lower lobe (series
6, image 118). No pleural effusion or pneumothorax.

Upper Abdomen: No acute abnormality.

Musculoskeletal: No chest wall abnormality. No acute or significant
osseous findings.

Review of the MIP images confirms the above findings.
IMPRESSION: 1.  Negative examination for pulmonary embolism.

2. Nonspecific 5 mm pulmonary nodule of the dependent left lower
lobe. No follow-up needed if patient is low-risk. Non-contrast chest
CT can be considered in 12 months if patient is high-risk. This
recommendation follows the consensus statement: Guidelines for
Management of Incidental Pulmonary Nodules Detected on CT Images:

## 2020-05-26 MED ORDER — CYCLOBENZAPRINE HCL 5 MG PO TABS: 5.0000 mg | ORAL_TABLET | Freq: Three times a day (TID) | ORAL | 0 refills | Status: DC | PRN

## 2020-05-26 MED ORDER — IOHEXOL 350 MG/ML SOLN
68.0000 mL | Freq: Once | INTRAVENOUS | Status: AC | PRN
  Administered 2020-05-26: 68 mL via INTRAVENOUS

## 2020-05-26 NOTE — Discharge Instructions (Signed)
Take motrin for pain   Take flexeril for muscle pain   Stay hydrated   See your doctor   Return to ER if you have worse chest pain, trouble breathing, fever

## 2020-05-26 NOTE — ED Provider Notes (Signed)
  Physical Exam  BP 99/74 (BP Location: Right Arm)   Pulse 100   Temp 99 F (37.2 C) (Oral)   Resp 16   Ht 5\' 8"  (1.727 m)   Wt 59 kg   SpO2 100%   BMI 19.77 kg/m   Physical Exam  ED Course/Procedures     Procedures  MDM  Patient care assumed at 3 pm.  Patient was diagnosed with Covid 3 weeks ago and started having some chest pressure yesterday.  Patient is not hypoxic.  Patient was borderline tachycardic so CTA was ordered and pending.  Since she is low risk for ACS, 1 set of troponin sufficient  4:16 PM CTA showed no PE. Not hypoxic. Stable for discharge     , MD 05/26/20 [phone]

## 2020-05-26 NOTE — ED Provider Notes (Signed)
[facility] EMERGENCY DEPARTMENT Provider Note   CSN: [id number] Arrival date & time: 05/26/20  1252     History Chief Complaint  Patient presents with  . Chest Pain    Donna Leach is a 29 y.o. female.  The history is provided by the patient.  Chest Pain Pain location:  Substernal area Pain quality: aching   Pain severity:  Mild Onset quality:  Gradual Duration:  2 days Timing:  Intermittent Progression:  Waxing and waning Chronicity:  New Context comment:  Lying back Relieved by:  Nothing Worsened by:  Nothing Associated symptoms: no abdominal pain, no back pain, no cough, no fever, no palpitations, no shortness of breath and no vomiting   Risk factors: birth control   Risk factors: no coronary artery disease, no high cholesterol, no hypertension and no prior DVT/PE   Risk factors comment:  COVID last month      Past Medical History:  Diagnosis Date  . Depression   . No pertinent past medical history     Patient Active Problem List   Diagnosis Date Noted  . Thyroiditis 11/04/2018  . Traumatic hematoma of finger 11/06/2016  . Depression 04/08/2016    Past Surgical History:  Procedure Laterality Date  . Adnoids    . APPENDECTOMY       OB History    Gravida  0   Para      Term      Preterm      AB      Living        SAB      TAB      Ectopic      Multiple      Live Births              Family History  Problem Relation Age of Onset  . Hypertension Father   . Diabetes Father   . Diabetes Mother   . Hyperthyroidism Other     Social History   Tobacco Use  . Smoking status: Former Smoker    Years: 2.00    Quit date: 11/14/2016    Years since quitting: 3.5  . Smokeless tobacco: Never Used  . Tobacco comment: no cigarettes in 3 months  Substance Use Topics  . Alcohol use: Yes    Alcohol/week: 1.0 standard drink    Types: 1 Standard drinks or equivalent per week    Comment: socially  . Drug use:  No    Home Medications Prior to Admission medications   Medication Sig Start Date End Date Taking? Authorizing Provider  Ascorbic Acid (VITAMIN C PO) Take by mouth.    [provider]  clobetasol cream (TEMOVATE) 0.05 % Apply topically 2 (two) times daily. 11/17/19   [provider]  Cyanocobalamin (B-12 PO) Take by mouth.    [provider]  etonogestrel (NEXPLANON) 68 MG IMPL implant Inject into the skin. 11/11/11   [provider]  ketoconazole (NIZORAL) 2 % shampoo APPLY ONCE DAILY TO SCALP ONCE DAILY FOR ONE WEEK THEN DECREASE TO ONCE A WEEK 11/17/19   [provider]  Multiple Vitamins-Minerals (ZINC PO) Take by mouth.    [provider]    Allergies    Amoxicillin, Penicillins, and Suprax [cefixime]  Review of Systems   Review of Systems  Constitutional: Negative for chills and fever.  HENT: Negative for ear pain and sore throat.   Eyes: Negative for pain and visual disturbance.  Respiratory: Negative for cough  and shortness of breath.   Cardiovascular: Positive for chest pain. Negative for palpitations.  Gastrointestinal: Negative for abdominal pain and vomiting.  Genitourinary: Negative for dysuria and hematuria.  Musculoskeletal: Negative for arthralgias and back pain.  Skin: Negative for color change and rash.  Neurological: Negative for seizures and syncope.  All other systems reviewed and are negative.   Physical Exam Updated Vital Signs  ED Triage Vitals  Enc Vitals Group     BP 05/26/20 1304 99/74     Pulse Rate 05/26/20 1304 100     Resp 05/26/20 1304 16     Temp 05/26/20 1304 99 F (37.2 C)     Temp Source 05/26/20 1304 Oral     SpO2 05/26/20 1304 100 %     Weight 05/26/20 1306 130 lb (59 kg)     Height 05/26/20 1306 5\' 8"  (1.727 m)     Head Circumference --      Peak Flow --      Pain Score 05/26/20 1306 6     Pain Loc --      Pain Edu? --      Excl. in GC? --     Physical Exam Vitals and  nursing note reviewed.  Constitutional:      General: She is not in acute distress.    Appearance: She is well-developed. She is not ill-appearing.  HENT:     Head: Normocephalic and atraumatic.  Eyes:     Conjunctiva/sclera: Conjunctivae normal.     Pupils: Pupils are equal, round, and reactive to light.  Cardiovascular:     Rate and Rhythm: Normal rate and regular rhythm.     Pulses:          Radial pulses are 2+ on the right side and 2+ on the left side.     Heart sounds: Normal heart sounds. No murmur heard.   Pulmonary:     Effort: Pulmonary effort is normal. No respiratory distress.     Breath sounds: Normal breath sounds. No decreased breath sounds, wheezing or rhonchi.  Abdominal:     Palpations: Abdomen is soft.     Tenderness: There is no abdominal tenderness.  Musculoskeletal:        General: Normal range of motion.     Cervical back: Normal range of motion and neck supple.     Right lower leg: No edema.     Left lower leg: No edema.  Skin:    General: Skin is warm and dry.     Capillary Refill: Capillary refill takes less than 2 seconds.  Neurological:     General: No focal deficit present.     Mental Status: She is alert.     ED Results / Procedures / Treatments   Labs (all labs ordered are listed, but only abnormal results are displayed) Labs Reviewed  BASIC METABOLIC PANEL - Abnormal; Notable for the following components:      Result Value   Glucose, Bld 107 (*)    All other components within normal limits  CBC - Abnormal; Notable for the following components:   RDW 11.4 (*)    All other components within normal limits  HEPATIC FUNCTION PANEL  LIPASE, BLOOD  I-STAT BETA HCG BLOOD, ED ([facility], [facility], [facility] ONLY)  TROPONIN I (HIGH SENSITIVITY)  TROPONIN I (HIGH SENSITIVITY)    EKG EKG Interpretation  Date/Time:  Saturday May 26 2020 12:58:36 EDT Ventricular Rate:  94 PR Interval:  134 QRS Duration: 72 QT Interval:  332 QTC Calculation: 415 R  Axis:   70 Text Interpretation: Normal sinus rhythm Possible Left atrial enlargement Nonspecific T wave abnormality Abnormal ECG Confirmed by Virgina Norfolk [phone]) on 05/26/2020 1:59:49 PM   Radiology DG Chest 2 View  Result Date: 05/26/2020 CLINICAL DATA:  Chest pain, COVID-19 positive 20 days ago EXAM: CHEST - 2 VIEW COMPARISON:  None. FINDINGS: The heart size and mediastinal contours are within normal limits. Both lungs are clear. The visualized skeletal structures are unremarkable. IMPRESSION: No active cardiopulmonary disease. Electronically Signed   By: Elige Ko   On: 05/26/2020 13:36    Procedures Procedures (including critical care time)  Medications Ordered in ED Medications - No data to display  ED Course  I have reviewed the triage vital signs and the nursing notes.  Pertinent labs & imaging results that were available during my care of the patient were reviewed by me and considered in my medical decision making (see chart for details).    MDM Rules/Calculators/A&P                          Donna Leach is a 29 year old female with history of depression who presents to the ED with chest pain. Unremarkable vitals. EKG shows sinus rhythm. No ischemic changes. Had Covid about 3 weeks ago. Developed chest pain yesterday. Does not feel short of breath. Feels may be positional or shortness of breath. Not sure if it is food related. Mostly in the center of her chest. Chest x-ray shows no signs of pneumonia, no pneumothorax. Troponin normal. No significant anemia, electrolyte abnormality, kidney injury. Given her recent Covid diagnosis we will get a CT scan of the chest to rule out PE. We will add lipase and hepatic function panel to look for any other GI causes of her pain. Could be reflux. Could be musculoskeletal. Signed out to oncoming ED staff with patient pending remaining lab work and imaging. Please see their note for further results, evaluation, disposition of the  patient.  This chart was dictated using voice recognition software.  Despite best efforts to proofread,  errors can occur which can change the documentation meaning.    Final Clinical Impression(s) / ED Diagnoses Final diagnoses:  Chest pain, unspecified type    Rx / DC Orders ED Discharge Orders    None       Virgina Norfolk, DO 05/26/20 1451

## 2020-05-26 NOTE — ED Notes (Signed)
Patient verbalizes understanding of discharge instructions. Opportunity for questioning and answers were provided. Armband removed by staff, pt discharged from ED ambulatory.   

## 2020-05-26 NOTE — ED Triage Notes (Signed)
Pt states she was COVID + 20 days ago.  C/o pain to center of chest and bilateral arm pain since last night.  Denies SOB, nausea, and vomiting.

## 2020-05-28 DIAGNOSIS — Z20822 Contact with and (suspected) exposure to covid-19: Secondary | ICD-10-CM | POA: Diagnosis not present

## 2020-05-30 ENCOUNTER — Ambulatory Visit: Payer: BC Managed Care – PPO | Admitting: Internal Medicine

## 2020-05-30 DIAGNOSIS — R9431 Abnormal electrocardiogram [ECG] [EKG]: Secondary | ICD-10-CM | POA: Diagnosis not present

## 2020-05-30 DIAGNOSIS — R002 Palpitations: Secondary | ICD-10-CM | POA: Diagnosis not present

## 2020-05-30 DIAGNOSIS — Z8616 Personal history of COVID-19: Secondary | ICD-10-CM | POA: Diagnosis not present

## 2020-05-30 DIAGNOSIS — Z79899 Other long term (current) drug therapy: Secondary | ICD-10-CM | POA: Diagnosis not present

## 2020-05-30 DIAGNOSIS — R0789 Other chest pain: Secondary | ICD-10-CM | POA: Diagnosis not present

## 2020-05-30 DIAGNOSIS — R001 Bradycardia, unspecified: Secondary | ICD-10-CM | POA: Diagnosis not present

## 2020-05-30 DIAGNOSIS — Z87891 Personal history of nicotine dependence: Secondary | ICD-10-CM | POA: Diagnosis not present

## 2020-05-30 DIAGNOSIS — R079 Chest pain, unspecified: Secondary | ICD-10-CM | POA: Diagnosis not present

## 2020-06-02 ENCOUNTER — Encounter (HOSPITAL_COMMUNITY): Payer: Self-pay | Admitting: Emergency Medicine

## 2020-06-02 ENCOUNTER — Other Ambulatory Visit: Payer: Self-pay

## 2020-06-02 ENCOUNTER — Emergency Department (HOSPITAL_COMMUNITY)
Admission: EM | Admit: 2020-06-02 | Discharge: 2020-06-02 | Disposition: A | Discharge status: Home / Self Care | Payer: BC Managed Care – PPO | Attending: Emergency Medicine | Admitting: Emergency Medicine

## 2020-06-02 DIAGNOSIS — R1013 Epigastric pain: Secondary | ICD-10-CM | POA: Diagnosis not present

## 2020-06-02 DIAGNOSIS — R002 Palpitations: Secondary | ICD-10-CM | POA: Insufficient documentation

## 2020-06-02 DIAGNOSIS — Z8616 Personal history of COVID-19: Secondary | ICD-10-CM | POA: Diagnosis not present

## 2020-06-02 DIAGNOSIS — K2960 Other gastritis without bleeding: Secondary | ICD-10-CM

## 2020-06-02 DIAGNOSIS — Z87891 Personal history of nicotine dependence: Secondary | ICD-10-CM | POA: Diagnosis not present

## 2020-06-02 DIAGNOSIS — R079 Chest pain, unspecified: Secondary | ICD-10-CM | POA: Diagnosis not present

## 2020-06-02 DIAGNOSIS — K219 Gastro-esophageal reflux disease without esophagitis: Secondary | ICD-10-CM | POA: Diagnosis not present

## 2020-06-02 DIAGNOSIS — R05 Cough: Secondary | ICD-10-CM | POA: Diagnosis not present

## 2020-06-02 DIAGNOSIS — R42 Dizziness and giddiness: Secondary | ICD-10-CM | POA: Diagnosis not present

## 2020-06-02 DIAGNOSIS — U071 COVID-19: Secondary | ICD-10-CM

## 2020-06-02 LAB — BASIC METABOLIC PANEL
Anion gap: 10 (ref 5–15)
BUN: 16 mg/dL (ref 6–20)
CO2: 25 mmol/L (ref 22–32)
Calcium: 9.4 mg/dL (ref 8.9–10.3)
Chloride: 102 mmol/L (ref 98–111)
Creatinine, Ser: 0.82 mg/dL (ref 0.44–1.00)
GFR calc Af Amer: >60 mL/min (ref >60)
GFR calc non Af Amer: >60 mL/min (ref >60)
Glucose, Bld: 106 mg/dL — ABNORMAL HIGH (ref 70–99)
Potassium: 4 mmol/L (ref 3.5–5.1)
Sodium: 137 mmol/L (ref 135–145)

## 2020-06-02 LAB — CBC
HCT: 37.6 % (ref 36.0–46.0)
Hemoglobin: 12.8 g/dL (ref 12.0–15.0)
MCH: 32.7 pg (ref 26.0–34.0)
MCHC: 34 g/dL (ref 30.0–36.0)
MCV: 96.2 fL (ref 80.0–100.0)
Platelets: 221 10*3/uL (ref 150–400)
RBC: 3.91 MIL/uL (ref 3.87–5.11)
RDW: 11.3 % — ABNORMAL LOW (ref 11.5–15.5)
WBC: 6.3 10*3/uL (ref 4.0–10.5)
nRBC: 0 % (ref 0.0–0.2)

## 2020-06-02 LAB — I-STAT BETA HCG BLOOD, ED (MC, WL, AP ONLY): I-stat hCG, quantitative: <5 m[IU]/mL (ref <5)

## 2020-06-02 MED ORDER — FAMOTIDINE 20 MG PO TABS: 20.0000 mg | ORAL_TABLET | Freq: Two times a day (BID) | ORAL | 2 refills | Status: DC

## 2020-06-02 MED ORDER — OMEPRAZOLE 20 MG PO CPDR: 20.0000 mg | DELAYED_RELEASE_CAPSULE | Freq: Every day | ORAL | 2 refills | Status: DC

## 2020-06-02 NOTE — Discharge Instructions (Signed)
I started you on 2 medications for what I suspect is acid reflux problems.  These are omeprazole and Pepcid.  Also recommend that you not eat any food within 4 hours of bedtime, and that you sleep propped up on 2 pillows at night.  Please follow-up with your cardiologist about your palpitations and your heart monitor.  Your blood work otherwise was reassuring today.  As I explained, it is possible that you are still suffering from Covid symptoms even 3 weeks after diagnosis.  Some people go on to have a long Covid syndrome that may last several months.

## 2020-06-02 NOTE — ED Triage Notes (Signed)
EMS stated, she has had episodes of palpitations and some near syncope episodes. The palpitations started last night and the syncope started around 1300 today. She is wearing heart monitor for irregular heart beat has had it for 4 days another 4 more.

## 2020-06-02 NOTE — ED Provider Notes (Signed)
[facility] EMERGENCY DEPARTMENT Provider Note   CSN: [id number] Arrival date & time: 06/02/20  1451     History Chief Complaint  Patient presents with  . Palpitations  . Near Syncope    Donna Leach is a 29 y.o. female with a history of Covid diagnosis approximately 3 weeks ago presented emergency department palpitations and epigastric pain.  The patient reports she is been having palpitations for the past 3 weeks since Covid started, according to record review she had a CT PE study performed during her last visit a week ago on 05/26/20 which was negative for PE.  She also saw a cardiologist and is wearing a heart monitor currently, and has a follow-up appointment with them.  She continues to have occasional palpitations.  She felt very lightheaded earlier today.  She also reports that she is having epigastric and lower chest pressure for the past several days.  She says this is worse with any eating, usually within an hour of eating food, and also worse at night when, when she has been laying flat on the bed.  She has no personal history of reflux but has a twin sister who does suffer from GERD.  For symptoms the only medication she has taken is Tylenol.  She has not taken any reflux medications or any other medications.  She also has a dry persistent cough.  She denies any nausea.  She denies vomiting.    HPI     Past Medical History:  Diagnosis Date  . Depression   . No pertinent past medical history     Patient Active Problem List   Diagnosis Date Noted  . Thyroiditis 11/04/2018  . Traumatic hematoma of finger 11/06/2016  . Depression 04/08/2016    Past Surgical History:  Procedure Laterality Date  . Adnoids    . APPENDECTOMY       OB History    Gravida  0   Para      Term      Preterm      AB      Living        SAB      TAB      Ectopic      Multiple      Live Births              Family History  Problem  Relation Age of Onset  . Hypertension Father   . Diabetes Father   . Diabetes Mother   . Hyperthyroidism Other     Social History   Tobacco Use  . Smoking status: Former Smoker    Years: 2.00    Quit date: 11/14/2016    Years since quitting: 3.5  . Smokeless tobacco: Never Used  . Tobacco comment: no cigarettes in 3 months  Substance Use Topics  . Alcohol use: Yes    Alcohol/week: 1.0 standard drink    Types: 1 Standard drinks or equivalent per week    Comment: socially  . Drug use: No    Home Medications Prior to Admission medications   Medication Sig Start Date End Date Taking? Authorizing Provider  Ascorbic Acid (VITAMIN C PO) Take by mouth.    [provider]  clobetasol cream (TEMOVATE) 0.05 % Apply topically 2 (two) times daily. 11/17/19   [provider]  Cyanocobalamin (B-12 PO) Take by mouth.    [provider]  cyclobenzaprine (FLEXERIL) 5 MG tablet Take 1 tablet (5 mg total) by mouth  3 (three) times daily as needed. 05/26/20   Charlynne Pander, MD  etonogestrel (NEXPLANON) 68 MG IMPL implant Inject into the skin. 11/11/11   [provider]  famotidine (PEPCID) 20 MG tablet Take 1 tablet (20 mg total) by mouth 2 (two) times daily. 06/02/20 07/02/20  Terald Sleeper, MD  ketoconazole (NIZORAL) 2 % shampoo APPLY ONCE DAILY TO SCALP ONCE DAILY FOR ONE WEEK THEN DECREASE TO ONCE A WEEK 11/17/19   [provider]  Multiple Vitamins-Minerals (ZINC PO) Take by mouth.    [provider]  omeprazole (PRILOSEC) 20 MG capsule Take 1 capsule (20 mg total) by mouth daily for 30 doses. 06/02/20 07/02/20  Terald Sleeper, MD    Allergies    Amoxicillin, Penicillins, and Suprax [cefixime]  Review of Systems   Review of Systems  Constitutional: Negative for chills and fever.  HENT: Negative for ear pain and sore throat.   Eyes: Negative for pain and visual disturbance.  Respiratory: Positive for cough. Negative for shortness of  breath.   Cardiovascular: Positive for chest pain. Negative for palpitations.  Gastrointestinal: Positive for abdominal pain. Negative for diarrhea, nausea and vomiting.  Genitourinary: Positive for dysuria. Negative for hematuria.  Musculoskeletal: Negative for arthralgias and back pain.  Skin: Negative for color change and rash.  Neurological: Positive for light-headedness. Negative for facial asymmetry, speech difficulty and weakness.  Psychiatric/Behavioral: Negative for agitation and confusion.  All other systems reviewed and are negative.   Physical Exam Updated Vital Signs BP 105/70   Pulse 86   Temp 98.3 F (36.8 C)   Resp 18   SpO2 100%   Physical Exam Vitals and nursing note reviewed.  Constitutional:      General: She is not in acute distress.    Appearance: She is well-developed.  HENT:     Head: Normocephalic and atraumatic.  Eyes:     Conjunctiva/sclera: Conjunctivae normal.  Cardiovascular:     Rate and Rhythm: Normal rate and regular rhythm.  Pulmonary:     Effort: Pulmonary effort is normal. No respiratory distress.     Breath sounds: Normal breath sounds.  Musculoskeletal:     Cervical back: Neck supple.  Skin:    General: Skin is warm and dry.  Neurological:     General: No focal deficit present.     Mental Status: She is alert and oriented to person, place, and time.     ED Results / Procedures / Treatments   Labs (all labs ordered are listed, but only abnormal results are displayed) Labs Reviewed  BASIC METABOLIC PANEL - Abnormal; Notable for the following components:      Result Value   Glucose, Bld 106 (*)    All other components within normal limits  CBC - Abnormal; Notable for the following components:   RDW 11.3 (*)    All other components within normal limits  I-STAT BETA HCG BLOOD, ED ([facility], [facility], [facility] ONLY)    EKG EKG Interpretation  Date/Time:  Saturday June 02 2020 15:17:22 EDT Ventricular Rate:  83 PR Interval:  138 QRS  Duration: 72 QT Interval:  354 QTC Calculation: 415 R Axis:   84 Text Interpretation: Normal sinus rhythm Right atrial enlargement Possible Anterior infarct , age undetermined Abnormal ECG No STEMI Confirmed by Alvester Chou [phone]) on 06/02/2020 4:01:17 PM   Radiology No results found.  Procedures Procedures (including critical care time)  Medications Ordered in ED Medications - No data to display  ED Course  I have reviewed the triage vital signs and the nursing notes.  Pertinent labs & imaging results that were available during my care of the patient were reviewed by me and considered in my medical decision making (see chart for details).  This is a 29 year old female presented to emergency department with epigastric pain and nausea in the setting of Covid diagnosis 3 weeks ago.  She continues have palpitations.  Her EKG here shows sinus rhythm without sign of heart block or ACS.  1. Palpitations  - could be continuation of covid illness.  Stable here.  No evidence of arrhythmia.  She has a heart monitor and can f/u with cardiology.  K normal.    2. Chest pressure  - very likely reflux from her descriptions.  Will start on omeprazole, pepcid, give reflux advise.  Could be triggered again by covid.   - doubt ACS at her age with no risk factors - negative CT PE in past 2 weeks for similar presentation.  Doubt PE at this time.  3.  COVID-19  - no hypoxia, breathing is unlabored, she is >3 weeks out and no longer a candidate for infusions or additional therapies - she may be suffering from some long-covid symptoms including fatigue and poor appetite.  Discussed this with her.  Labs personally reviewed as noted above.     Final Clinical Impression(s) / ED Diagnoses Final diagnoses:  Reflux gastritis  COVID-19  Palpitations    Rx / DC Orders ED Discharge Orders         Ordered    omeprazole (PRILOSEC) 20 MG capsule  Daily        06/02/20 1726    famotidine (PEPCID) 20  MG tablet  2 times daily        06/02/20 1726           Terald Sleeper, MD 06/03/20 [phone]

## 2020-06-03 ENCOUNTER — Emergency Department (HOSPITAL_COMMUNITY)
Admission: EM | Admit: 2020-06-03 | Discharge: 2020-06-03 | Disposition: A | Discharge status: Home / Self Care | Payer: BC Managed Care – PPO | Attending: Emergency Medicine | Admitting: Emergency Medicine

## 2020-06-03 ENCOUNTER — Encounter (HOSPITAL_COMMUNITY): Payer: Self-pay | Admitting: Emergency Medicine

## 2020-06-03 ENCOUNTER — Other Ambulatory Visit: Payer: Self-pay

## 2020-06-03 ENCOUNTER — Emergency Department (HOSPITAL_BASED_OUTPATIENT_CLINIC_OR_DEPARTMENT_OTHER): Payer: BC Managed Care – PPO

## 2020-06-03 DIAGNOSIS — R252 Cramp and spasm: Secondary | ICD-10-CM | POA: Diagnosis not present

## 2020-06-03 DIAGNOSIS — F410 Panic disorder [episodic paroxysmal anxiety] without agoraphobia: Secondary | ICD-10-CM | POA: Diagnosis not present

## 2020-06-03 DIAGNOSIS — E069 Thyroiditis, unspecified: Secondary | ICD-10-CM | POA: Diagnosis not present

## 2020-06-03 DIAGNOSIS — R0789 Other chest pain: Secondary | ICD-10-CM | POA: Diagnosis not present

## 2020-06-03 DIAGNOSIS — Z87891 Personal history of nicotine dependence: Secondary | ICD-10-CM | POA: Diagnosis not present

## 2020-06-03 LAB — CBC WITH DIFFERENTIAL/PLATELET
Abs Immature Granulocytes: 0.04 10*3/uL (ref 0.00–0.07)
Basophils Absolute: 0 10*3/uL (ref 0.0–0.1)
Basophils Relative: 0 %
Eosinophils Absolute: 0 10*3/uL (ref 0.0–0.5)
Eosinophils Relative: 0 %
HCT: 36.9 % (ref 36.0–46.0)
Hemoglobin: 12.6 g/dL (ref 12.0–15.0)
Immature Granulocytes: 1 %
Lymphocytes Relative: 15 %
Lymphs Abs: 1.1 10*3/uL (ref 0.7–4.0)
MCH: 32.5 pg (ref 26.0–34.0)
MCHC: 34.1 g/dL (ref 30.0–36.0)
MCV: 95.1 fL (ref 80.0–100.0)
Monocytes Absolute: 0.4 10*3/uL (ref 0.1–1.0)
Monocytes Relative: 5 %
Neutro Abs: 5.9 10*3/uL (ref 1.7–7.7)
Neutrophils Relative %: 79 %
Platelets: 196 10*3/uL (ref 150–400)
RBC: 3.88 MIL/uL (ref 3.87–5.11)
RDW: 11.4 % — ABNORMAL LOW (ref 11.5–15.5)
WBC: 7.5 10*3/uL (ref 4.0–10.5)
nRBC: 0 % (ref 0.0–0.2)

## 2020-06-03 LAB — BASIC METABOLIC PANEL
Anion gap: 10 (ref 5–15)
BUN: 15 mg/dL (ref 6–20)
CO2: 26 mmol/L (ref 22–32)
Calcium: 9.5 mg/dL (ref 8.9–10.3)
Chloride: 102 mmol/L (ref 98–111)
Creatinine, Ser: 0.94 mg/dL (ref 0.44–1.00)
GFR calc Af Amer: >60 mL/min (ref >60)
GFR calc non Af Amer: >60 mL/min (ref >60)
Glucose, Bld: 113 mg/dL — ABNORMAL HIGH (ref 70–99)
Potassium: 4.2 mmol/L (ref 3.5–5.1)
Sodium: 138 mmol/L (ref 135–145)

## 2020-06-03 LAB — MAGNESIUM: Magnesium: 2 mg/dL (ref 1.7–2.4)

## 2020-06-03 MED ORDER — HYDROXYZINE HCL 25 MG PO TABS: 25.0000 mg | ORAL_TABLET | Freq: Four times a day (QID) | ORAL | 0 refills | Status: DC

## 2020-06-03 NOTE — ED Triage Notes (Addendum)
Pt states she is having panic attacks, L upper arm pain, and L leg cramps x 1 week.  States she was seen in ED yesterday for chest pain and was diagnosed with acid reflux.  States she didn't mention the leg cramps yesterday because she was focused on the chest pain.  Pt has heart monitor she is wearing for irregular HR.

## 2020-06-03 NOTE — Discharge Instructions (Addendum)
At this time there does not appear to be the presence of an emergent medical condition, however there is always the potential for conditions to change. Please read and follow the below instructions.  Please return to the Emergency Department immediately for any new or worsening symptoms. Please be sure to follow up with your Primary Care Provider within one week regarding your visit today; please call their office to schedule an appointment even if you are feeling better for a follow-up visit. Please drink plenty of water and get plenty of rest. You may use the medication Vistaril as prescribed to help with anxiety.  This medication may make you drowsy so do not drive, drink alcohol or perform any potentially dangerous activities after taking Vistaril. You may review all of your results on your MyChart account.  Please discuss all your results in full with your primary care provider at your follow-up visit.  Additionally your CT scan earlier this month showed a small 5 mm nodule in your left lower lobe of uncertain significance.  Please discuss this with your primary care provider at your follow-up visit to see if follow-up imaging is needed.  Go to the nearest Emergency Department immediately if: You have fever or chills Your leg cramps get more severe or more frequent, or they do not improve over time. Your foot becomes cold, numb, or blue. A racing heart and shortness of breath. Thoughts of hurting yourself or others. You have any new/concerning or worsening of symptoms. If you ever feel like you may hurt yourself or others, or have thoughts about taking your own life, get help right away. You can go to your nearest emergency department or call: Your local emergency services (911 in the [place] A suicide crisis helpline, such as the National Suicide Prevention Lifeline at 9366634767. This is open 24 hours a day.  Please read the additional information packets attached to your discharge  summary.  Do not take your medicine if  develop an itchy rash, swelling in your mouth or lips, or difficulty breathing; call 911 and seek immediate emergency medical attention if this occurs.  You may review your lab tests and imaging results in their entirety on your MyChart account.  Please discuss all results of fully with your primary care provider and other specialist at your follow-up visit.  Note: Portions of this text may have been transcribed using voice recognition software. Every effort was made to ensure accuracy; however, inadvertent computerized transcription errors may still be present.

## 2020-06-03 NOTE — Progress Notes (Signed)
VASCULAR LAB    Bilateral lower extremity venous duplex has been performed.  See CV proc for preliminary results.  Messaged Harlene Salts, PA-C, with results  Gina Costilla, RVT 06/03/2020, 9:40 AM

## 2020-06-03 NOTE — ED Provider Notes (Signed)
[facility] EMERGENCY DEPARTMENT Provider Note   CSN: [id number] Arrival date & time: 06/03/20  4098     History Chief Complaint  Patient presents with  . Panic Attack  . leg cramps    Donna Leach is a 28 y.o. female presents for cramping of her left leg.  She reports that around 15 years ago she had cramping of the left leg, no previous diagnosis or known causes had improved for several years.  She reports that after she was diagnosed with Covid 19 earlier this month she felt her leg cramping returned.  This is an intermittent cramping sensation nonradiating no aggravating or alleviating factors, comes on at random times primarily to the left calf and distal left thigh.  She reports it feels similar to the cramping sensation all this years earlier.  She is anxious that it may be related to a blood clot of her leg which is why she presented today.  She also reports she has been having some pain around her left tricep started yesterday, a mild throbbing ache no aggravating or alleviating factors.   Patient reports increased anxiety since her COVID-19 diagnosis, she reports her anxiety is a for blood clot in her leg which she would like to be ruled out.  Patient reports that she came to the ER yesterday for concern of palpitations and some chest discomfort.  She reports that she was discharged with antireflux medication which she took yesterday with complete resolution of those symptoms.  She denies any chest pain or shortness of breath, palpitations, difficulty breathing.  She denies any fever/chills, fall/injury, neck pain, back pain, abdominal pain, nausea/vomiting, diarrhea, numbness/weakness, tingling, saddle area paresthesias or any additional concerns.  HPI     Past Medical History:  Diagnosis Date  . Depression   . No pertinent past medical history     Patient Active Problem List   Diagnosis Date Noted  . Thyroiditis 11/04/2018  . Traumatic  hematoma of finger 11/06/2016  . Depression 04/08/2016    Past Surgical History:  Procedure Laterality Date  . Adnoids    . APPENDECTOMY       OB History    Gravida  0   Para      Term      Preterm      AB      Living        SAB      TAB      Ectopic      Multiple      Live Births              Family History  Problem Relation Age of Onset  . Hypertension Father   . Diabetes Father   . Diabetes Mother   . Hyperthyroidism Other     Social History   Tobacco Use  . Smoking status: Former Smoker    Years: 2.00    Quit date: 11/14/2016    Years since quitting: 3.5  . Smokeless tobacco: Never Used  . Tobacco comment: no cigarettes in 3 months  Substance Use Topics  . Alcohol use: Yes    Alcohol/week: 1.0 standard drink    Types: 1 Standard drinks or equivalent per week    Comment: socially  . Drug use: No    Home Medications Prior to Admission medications   Medication Sig Start Date End Date Taking? Authorizing Provider  Ascorbic Acid (VITAMIN C PO) Take by mouth.    [provider]  clobetasol cream (TEMOVATE) 0.05 % Apply topically 2 (two) times daily. 11/17/19   [provider]  Cyanocobalamin (B-12 PO) Take by mouth.    [provider]  cyclobenzaprine (FLEXERIL) 5 MG tablet Take 1 tablet (5 mg total) by mouth 3 (three) times daily as needed. 05/26/20   Charlynne Pander, MD  etonogestrel (NEXPLANON) 68 MG IMPL implant Inject into the skin. 11/11/11   [provider]  famotidine (PEPCID) 20 MG tablet Take 1 tablet (20 mg total) by mouth 2 (two) times daily. 06/02/20 07/02/20  Terald Sleeper, MD  hydrOXYzine (ATARAX/VISTARIL) 25 MG tablet Take 1 tablet (25 mg total) by mouth every 6 (six) hours. 06/03/20   Harlene Salts A, PA-C  ketoconazole (NIZORAL) 2 % shampoo APPLY ONCE DAILY TO SCALP ONCE DAILY FOR ONE WEEK THEN DECREASE TO ONCE A WEEK 11/17/19   [provider]  Multiple Vitamins-Minerals (ZINC PO)  Take by mouth.    [provider]  omeprazole (PRILOSEC) 20 MG capsule Take 1 capsule (20 mg total) by mouth daily for 30 doses. 06/02/20 07/02/20  Terald Sleeper, MD    Allergies    Amoxicillin, Penicillins, and Suprax [cefixime]  Review of Systems   Review of Systems Ten systems are reviewed and are negative for acute change except as noted in the HPI  Physical Exam Updated Vital Signs BP 109/68 (BP Location: Right Arm)   Pulse 82   Temp 98.2 F (36.8 C) (Oral)   Resp 16   SpO2 99%   Physical Exam Constitutional:      General: She is not in acute distress.    Appearance: Normal appearance. She is well-developed. She is not ill-appearing or diaphoretic.  HENT:     Head: Normocephalic and atraumatic.     Right Ear: External ear normal.     Left Ear: External ear normal.  Eyes:     General: Vision grossly intact. Gaze aligned appropriately.     Pupils: Pupils are equal, round, and reactive to light.  Neck:     Trachea: Trachea and phonation normal.  Cardiovascular:     Rate and Rhythm: Normal rate and regular rhythm.     Pulses: Normal pulses.          Dorsalis pedis pulses are 2+ on the right side and 2+ on the left side.  Pulmonary:     Effort: Pulmonary effort is normal. No respiratory distress.  Abdominal:     General: There is no distension.     Palpations: Abdomen is soft.     Tenderness: There is no abdominal tenderness. There is no guarding or rebound.  Musculoskeletal:        General: Normal range of motion.     Cervical back: Normal range of motion.     Right lower leg: No edema.     Left lower leg: No edema.     Comments: No midline C/T/L spinal tenderness to palpation, no paraspinal muscle tenderness, no deformity, crepitus, or step-off noted. No sign of injury to the neck or back. - Appropriate range of motion and strength of all major joints of the upper and lower extremities without deformity.  No evidence of injury to the extremities.  Feet:      Right foot:     Protective Sensation: 5 sites tested. 5 sites sensed.     Skin integrity: Skin integrity normal.     Left foot:     Protective Sensation: 5 sites tested. 5 sites sensed.  Skin integrity: Skin integrity normal.  Skin:    General: Skin is warm and dry.  Neurological:     Mental Status: She is alert.     GCS: GCS eye subscore is 4. GCS verbal subscore is 5. GCS motor subscore is 6.     Comments: Speech is clear and goal oriented, follows commands Major Cranial nerves without deficit, no facial droop Normal strength in upper and lower extremities bilaterally including dorsiflexion and plantar flexion, strong and equal grip strength Sensation normal to extremities x 4. Moves extremities without ataxia, coordination intact Normal gait  Psychiatric:        Behavior: Behavior normal.    ED Results / Procedures / Treatments   Labs (all labs ordered are listed, but only abnormal results are displayed) Labs Reviewed  CBC WITH DIFFERENTIAL/PLATELET - Abnormal; Notable for the following components:      Result Value   RDW 11.4 (*)    All other components within normal limits  BASIC METABOLIC PANEL - Abnormal; Notable for the following components:   Glucose, Bld 113 (*)    All other components within normal limits  MAGNESIUM    EKG EKG Interpretation  Date/Time:  Sunday June 03 2020 07:33:27 EDT Ventricular Rate:  91 PR Interval:  150 QRS Duration: 74 QT Interval:  350 QTC Calculation: 430 R Axis:   77 Text Interpretation: Normal sinus rhythm Normal ECG No significant change since 06/02/2020 Confirmed by Geoffery Lyons ([id number]) on 06/03/2020 10:26:16 AM   Radiology VAS [place] LOWER EXTREMITY VENOUS (DVT) ([facility] and [facility] 7a-7p)  Result Date: 06/03/2020  Lower Venous DVT Study Indications: Lateral thigh and calf cramping.  Comparison Study: No prior study on file Performing Technologist: Sherren Kerns RVS  Examination Guidelines: A complete evaluation includes  B-mode imaging, spectral Doppler, color Doppler, and power Doppler as needed of all accessible portions of each vessel. Bilateral testing is considered an integral part of a complete examination. Limited examinations for reoccurring indications may be performed as noted. The reflux portion of the exam is performed with the patient in reverse Trendelenburg.  +---------+---------------+---------+-----------+----------+--------------+ RIGHT    CompressibilityPhasicitySpontaneityPropertiesThrombus Aging +---------+---------------+---------+-----------+----------+--------------+ CFV      Full           Yes      Yes                                 +---------+---------------+---------+-----------+----------+--------------+ SFJ      Full                                                        +---------+---------------+---------+-----------+----------+--------------+ FV Prox  Full                                                        +---------+---------------+---------+-----------+----------+--------------+ FV Mid   Full                                                        +---------+---------------+---------+-----------+----------+--------------+  FV DistalFull                                                        +---------+---------------+---------+-----------+----------+--------------+ PFV      Full                                                        +---------+---------------+---------+-----------+----------+--------------+ POP      Full           Yes      Yes                                 +---------+---------------+---------+-----------+----------+--------------+ PTV      Full                                                        +---------+---------------+---------+-----------+----------+--------------+ PERO     Full                                                         +---------+---------------+---------+-----------+----------+--------------+   +---------+---------------+---------+-----------+----------+--------------+ LEFT     CompressibilityPhasicitySpontaneityPropertiesThrombus Aging +---------+---------------+---------+-----------+----------+--------------+ CFV      Full           Yes      Yes                                 +---------+---------------+---------+-----------+----------+--------------+ SFJ      Full                                                        +---------+---------------+---------+-----------+----------+--------------+ FV Prox  Full                                                        +---------+---------------+---------+-----------+----------+--------------+ FV Mid   Full                                                        +---------+---------------+---------+-----------+----------+--------------+ FV DistalFull                                                        +---------+---------------+---------+-----------+----------+--------------+  PFV      Full                                                        +---------+---------------+---------+-----------+----------+--------------+ POP      Full           Yes      Yes                                 +---------+---------------+---------+-----------+----------+--------------+ PTV      Full                                                        +---------+---------------+---------+-----------+----------+--------------+ PERO     Full                                                        +---------+---------------+---------+-----------+----------+--------------+     Summary: BILATERAL: - No evidence of deep vein thrombosis seen in the lower extremities, bilaterally. - RIGHT: - No cystic structure found in the popliteal fossa.   *See table(s) above for measurements and observations.    Preliminary     Procedures Procedures  (including critical care time)  Medications Ordered in ED Medications - No data to display  ED Course  I have reviewed the triage vital signs and the nursing notes.  Pertinent labs & imaging results that were available during my care of the patient were reviewed by me and considered in my medical decision making (see chart for details).    MDM Rules/Calculators/A&P                         Additional history obtained from: 1. Nursing notes from this visit. 2. Review of electronic medical records.  Patient seen in ER yesterday, diagnosis of reflux gastritis, COVID-19, palpitations.  She was prescribed famotidine and omeprazole which she reports completely relieve the symptoms.  Patient was also seen in an emergency department on 05/30/2020 for complaint of chest pain.  Diagnosis was unspecified chest pain.  According to their ED course it appears she had negative cardiac enzymes, unremarkable labs, negative chest x-ray.  Patient was seen at [facility] emergency department on May 26, 2020.  Diagnosis was chest pain then as well.  She had unremarkable labs.  She also underwent a CT angio chest PE study which was negative for acute findings but did show a 5 mm pulmonary nodule in the left lower lobe.  Patient was seen at [facility] ER on May 15, 2020 diagnosis of bradycardia, dizziness, right-sided chest pain and Covid.  She was prescribed 5 pills of Ativan 0.05 mg for anxiety.  She had a TSH at that time and a free T4 which were within normal limits.  Unremarkable basic labs.  She was given cardiology referral for bradycardia and dizziness.  She also had an echocardiogram on September 7 which was normal.  A negative CT  head.  A negative chest x-ray.  Patient also had ED visits on 9/5, 9/3, 9/1. ----------------------- I ordered, reviewed and interpreted labs which include: CBC without leukocytosis or anemia. BMP without electrolyte derangement, AKI or gap. Magnesium level within normal  limits.  Bilateral lower extremity ultrasound DVT study:  Summary:  BILATERAL:  - No evidence of deep vein thrombosis seen in the lower extremities, bilaterally.  -  RIGHT:  - No cystic structure found in the popliteal fossa.   EKG obtained in triage: Normal sinus rhythm Normal ECG No significant change since 06/02/2020 Confirmed by Geoffery LyonseLo, Douglas ([id number]) on 06/03/2020 10:26:16 AM ----------------------------------  Patient has had extensive work-ups over the last month without evidence of emergent findings.  Today she presented for leg cramping, DVT was ruled out.  She has no evidence of injury.  She has no neuro complaints or evidence for cauda equina.  She is making urine has normal electrolytes no evidence for rhabdomyolysis.  Overall work-up is reassuring today.  No evidence of cellulitis, DVT, compartment syndrome, septic arthritis, neurovascular compromise or other emergent pathologies.  Patient was very relieved to find her DVT study was negative today.  Possible patient's complaints may be anxiety related she was given some Ativan by another ER earlier this month which she has finished.  Will start patient on Atarax to help with anxiety and encouraged her to follow-up with her primary care provider.  Patient had a negative TSH earlier this month, no indication for repeat thyroid studies.  She has normal vital signs in the ER today.  She has no SI/HI, hallucinations there is no indication for IVC or behavioral health evaluation at this time.  Patient's anxiety seems to stem from her Covid diagnosis earlier this month, reassurance was given, patient will follow up with her PCP, she has no hypoxia or respiratory complaints today. As to patient's arm pain she is also neurovascular intact there without evidence of injury there is no indication for imaging.  She had some mild pain around her trapezius, possible muscle strain there is no indication for imaging or further work-up.  As to patient's EKG  this was ordered in triage, unclear why it was ordered but it is reassuring.  She has no chest pain or difficulty breathing.  Discussed case with Dr. Judd Lienelo who agrees with discharge with Vistaril and PCP follow-up.  At this time there does not appear to be any evidence of an acute emergency medical condition and the patient appears stable for discharge with appropriate outpatient follow up. Diagnosis was discussed with patient who verbalizes understanding of care plan and is agreeable to discharge. I have discussed return precautions with patient who verbalizes understanding. Patient encouraged to follow-up with their PCP. All questions answered.  Note: Portions of this report may have been transcribed using voice recognition software. Every effort was made to ensure accuracy; however, inadvertent computerized transcription errors may still be present. Final Clinical Impression(s) / ED Diagnoses Final diagnoses:  Leg cramps    Rx / DC Orders ED Discharge Orders         Ordered    hydrOXYzine (ATARAX/VISTARIL) 25 MG tablet  Every 6 hours        06/03/20 1050           Elizabeth PalauMorelli, Muneer Leider A, PA-C 06/03/20 1052    Geoffery Lyonselo, [place], MD 06/03/20 1343

## 2020-06-03 NOTE — ED Notes (Signed)
Patient verbalizes understanding of discharge instructions. Opportunity for questioning and answers were provided. Armband removed by staff, pt discharged from ED.  

## 2020-06-04 ENCOUNTER — Emergency Department (HOSPITAL_COMMUNITY)
Admission: EM | Admit: 2020-06-04 | Discharge: 2020-06-05 | Disposition: A | Discharge status: Home / Self Care | Payer: BC Managed Care – PPO | Source: Home / Self Care | Attending: Emergency Medicine | Admitting: Emergency Medicine

## 2020-06-04 ENCOUNTER — Emergency Department (HOSPITAL_COMMUNITY): Payer: BC Managed Care – PPO

## 2020-06-04 ENCOUNTER — Encounter (HOSPITAL_COMMUNITY): Payer: Self-pay | Admitting: Emergency Medicine

## 2020-06-04 ENCOUNTER — Encounter (HOSPITAL_COMMUNITY): Payer: Self-pay

## 2020-06-04 ENCOUNTER — Emergency Department (HOSPITAL_COMMUNITY)
Admission: EM | Admit: 2020-06-04 | Discharge: 2020-06-04 | Disposition: A | Discharge status: Home / Self Care | Payer: BC Managed Care – PPO | Attending: Emergency Medicine | Admitting: Emergency Medicine

## 2020-06-04 DIAGNOSIS — J90 Pleural effusion, not elsewhere classified: Secondary | ICD-10-CM | POA: Diagnosis not present

## 2020-06-04 DIAGNOSIS — Q7649 Other congenital malformations of spine, not associated with scoliosis: Secondary | ICD-10-CM | POA: Diagnosis not present

## 2020-06-04 DIAGNOSIS — J3489 Other specified disorders of nose and nasal sinuses: Secondary | ICD-10-CM | POA: Diagnosis not present

## 2020-06-04 DIAGNOSIS — R079 Chest pain, unspecified: Secondary | ICD-10-CM | POA: Diagnosis not present

## 2020-06-04 DIAGNOSIS — R002 Palpitations: Secondary | ICD-10-CM | POA: Diagnosis not present

## 2020-06-04 DIAGNOSIS — M50222 Other cervical disc displacement at C5-C6 level: Secondary | ICD-10-CM | POA: Diagnosis not present

## 2020-06-04 DIAGNOSIS — R531 Weakness: Secondary | ICD-10-CM

## 2020-06-04 DIAGNOSIS — M4184 Other forms of scoliosis, thoracic region: Secondary | ICD-10-CM | POA: Diagnosis not present

## 2020-06-04 DIAGNOSIS — M6281 Muscle weakness (generalized): Secondary | ICD-10-CM | POA: Diagnosis not present

## 2020-06-04 DIAGNOSIS — R350 Frequency of micturition: Secondary | ICD-10-CM | POA: Diagnosis not present

## 2020-06-04 DIAGNOSIS — F411 Generalized anxiety disorder: Secondary | ICD-10-CM | POA: Diagnosis not present

## 2020-06-04 DIAGNOSIS — R0789 Other chest pain: Secondary | ICD-10-CM | POA: Diagnosis not present

## 2020-06-04 DIAGNOSIS — F419 Anxiety disorder, unspecified: Secondary | ICD-10-CM | POA: Diagnosis not present

## 2020-06-04 DIAGNOSIS — E876 Hypokalemia: Secondary | ICD-10-CM | POA: Diagnosis not present

## 2020-06-04 DIAGNOSIS — M47814 Spondylosis without myelopathy or radiculopathy, thoracic region: Secondary | ICD-10-CM | POA: Diagnosis not present

## 2020-06-04 DIAGNOSIS — Z87891 Personal history of nicotine dependence: Secondary | ICD-10-CM | POA: Insufficient documentation

## 2020-06-04 DIAGNOSIS — M542 Cervicalgia: Secondary | ICD-10-CM | POA: Insufficient documentation

## 2020-06-04 DIAGNOSIS — M5126 Other intervertebral disc displacement, lumbar region: Secondary | ICD-10-CM | POA: Diagnosis not present

## 2020-06-04 DIAGNOSIS — E069 Thyroiditis, unspecified: Secondary | ICD-10-CM | POA: Diagnosis not present

## 2020-06-04 DIAGNOSIS — R3981 Functional urinary incontinence: Secondary | ICD-10-CM | POA: Insufficient documentation

## 2020-06-04 DIAGNOSIS — G35 Multiple sclerosis: Secondary | ICD-10-CM | POA: Diagnosis not present

## 2020-06-04 DIAGNOSIS — M47812 Spondylosis without myelopathy or radiculopathy, cervical region: Secondary | ICD-10-CM | POA: Diagnosis not present

## 2020-06-04 LAB — CBC
HCT: 38.7 % (ref 36.0–46.0)
Hemoglobin: 13.2 g/dL (ref 12.0–15.0)
MCH: 32.8 pg (ref 26.0–34.0)
MCHC: 34.1 g/dL (ref 30.0–36.0)
MCV: 96 fL (ref 80.0–100.0)
Platelets: 231 10*3/uL (ref 150–400)
RBC: 4.03 MIL/uL (ref 3.87–5.11)
RDW: 11.5 % (ref 11.5–15.5)
WBC: 7.2 10*3/uL (ref 4.0–10.5)
nRBC: 0 % (ref 0.0–0.2)

## 2020-06-04 LAB — I-STAT BETA HCG BLOOD, ED (MC, WL, AP ONLY): I-stat hCG, quantitative: <5 m[IU]/mL (ref <5)

## 2020-06-04 LAB — CBG MONITORING, ED: Glucose-Capillary: 93 mg/dL (ref 70–99)

## 2020-06-04 IMAGING — DX DG CHEST 2V
2 series · 2 of 2 positions shown · non-contrast
Comparison: Radiograph [DATE], CT [DATE]

CLINICAL DATA: Chest pain

EXAM:
CHEST - 2 VIEW

[chest pa]
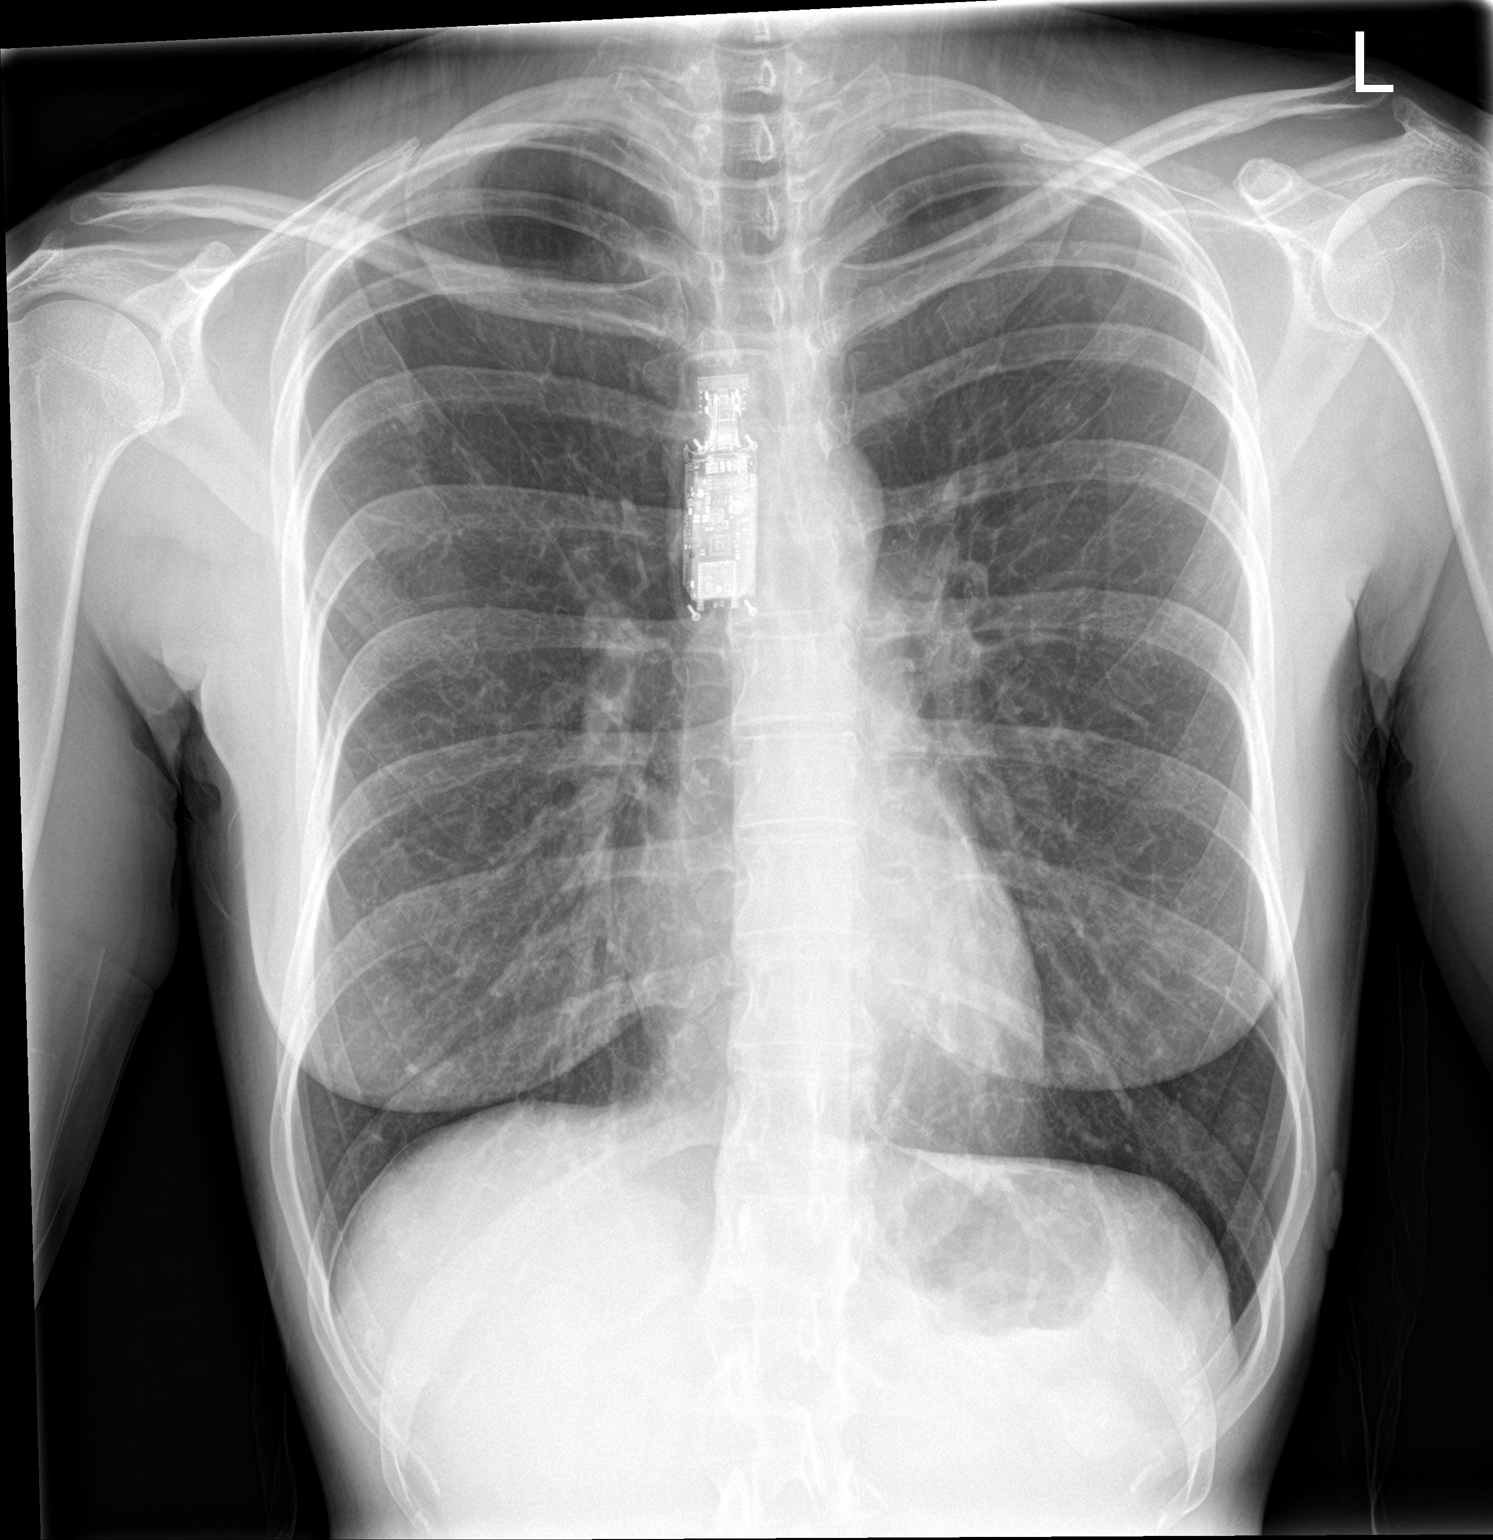

[chest lat]
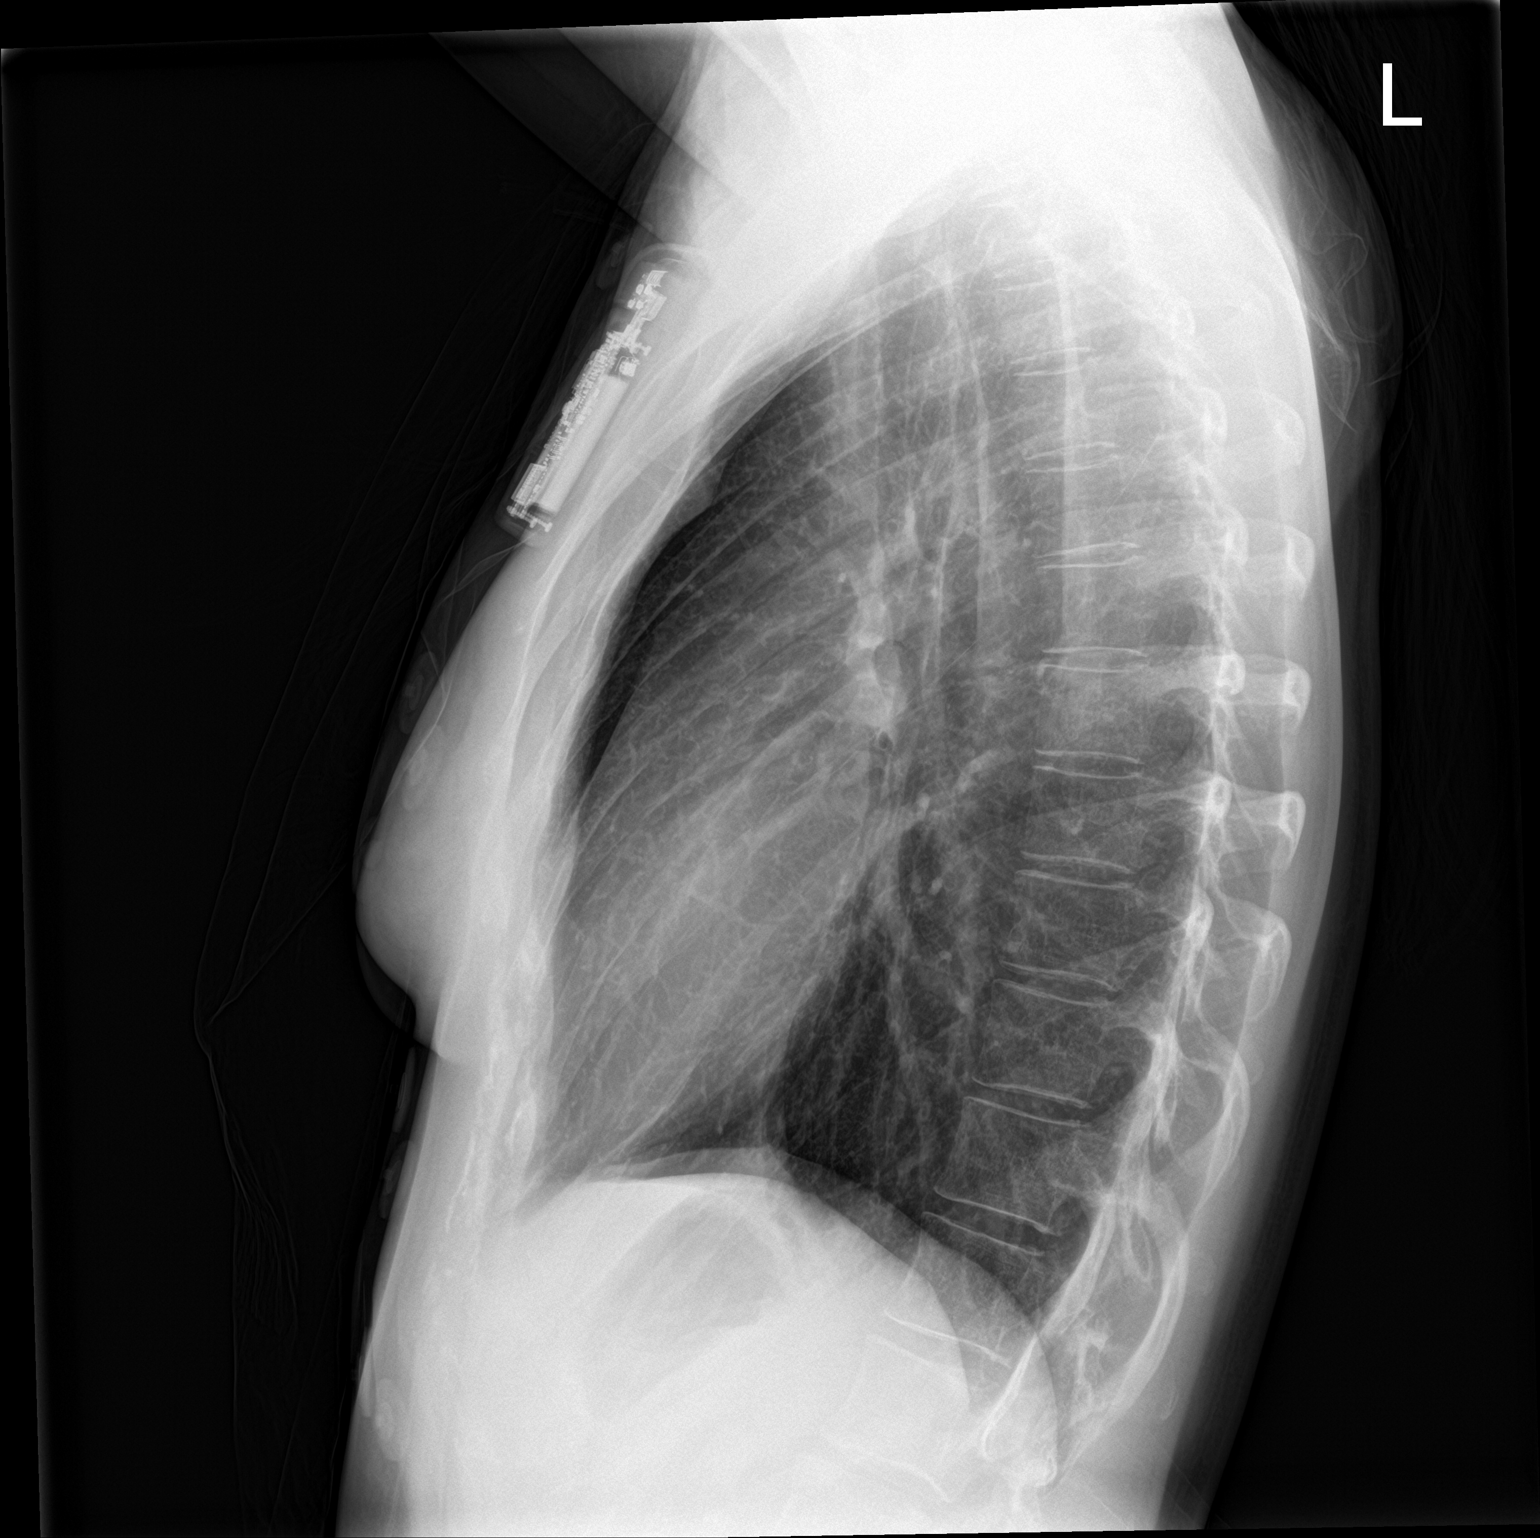

[2 of 2 positions shown; findings below may reference images not displayed]

FINDINGS: Electronic device projects over the upper chest just to the right of
midline. No consolidation, features of edema, pneumothorax, or
effusion. Pulmonary vascularity is normally distributed. The
cardiomediastinal contours are unremarkable. No acute osseous or
soft tissue abnormality.
IMPRESSION: 1.  No acute cardiopulmonary abnormality.
2. Electronic device projects over the upper anterior chest just to
the right of midline.

## 2020-06-04 NOTE — ED Provider Notes (Signed)
[facility] EMERGENCY DEPARTMENT Provider Note   CSN: [id number] Arrival date & time: 06/04/20  1410     History Chief Complaint  Patient presents with  . Extremity Weakness  . Neck Pain    Donna Leach is a 29 y.o. female.  Patient presents to the emergency department with a chief complaint of urinary incontinence.  She reports associated lower extremity weakness, which is subjective.  She states that when she stands up feels like her legs are going to give out on her, but she has not ever fallen.  She also reports intermittent tingling and states that sometimes she has an electrical shock sensation.  She reports that she does have intermittent blurry vision from time to time.  She denies any history of MS.  Denies any IV drug use.  She complains of having had some neck pain as well.  She denies any headache or fever. Additionally, she has been seen several times for chest pain and GERD like symptoms.  She recently had COVID.  The history is provided by the patient. No language interpreter was used.       Past Medical History:  Diagnosis Date  . Depression   . No pertinent past medical history     Patient Active Problem List   Diagnosis Date Noted  . Thyroiditis 11/04/2018  . Traumatic hematoma of finger 11/06/2016  . Depression 04/08/2016    Past Surgical History:  Procedure Laterality Date  . Adnoids    . APPENDECTOMY       OB History    Gravida  0   Para      Term      Preterm      AB      Living        SAB      TAB      Ectopic      Multiple      Live Births              Family History  Problem Relation Age of Onset  . Hypertension Father   . Diabetes Father   . Diabetes Mother   . Hyperthyroidism Other     Social History   Tobacco Use  . Smoking status: Former Smoker    Years: 2.00    Quit date: 11/14/2016    Years since quitting: 3.5  . Smokeless tobacco: Never Used  . Tobacco comment: no  cigarettes in 3 months  Substance Use Topics  . Alcohol use: Yes    Alcohol/week: 1.0 standard drink    Types: 1 Standard drinks or equivalent per week    Comment: socially  . Drug use: No    Home Medications Prior to Admission medications   Medication Sig Start Date End Date Taking? Authorizing Provider  Ascorbic Acid (VITAMIN C PO) Take by mouth.    [provider]  clobetasol cream (TEMOVATE) 0.05 % Apply topically 2 (two) times daily. 11/17/19   [provider]  Cyanocobalamin (B-12 PO) Take by mouth.    [provider]  cyclobenzaprine (FLEXERIL) 5 MG tablet Take 1 tablet (5 mg total) by mouth 3 (three) times daily as needed. 05/26/20   Charlynne Pander, MD  etonogestrel (NEXPLANON) 68 MG IMPL implant Inject into the skin. 11/11/11   [provider]  famotidine (PEPCID) 20 MG tablet Take 1 tablet (20 mg total) by mouth 2 (two) times daily. 06/02/20 07/02/20  Terald Sleeper, MD  hydrOXYzine (ATARAX/VISTARIL) 25  MG tablet Take 1 tablet (25 mg total) by mouth every 6 (six) hours. 06/03/20   Harlene Salts A, PA-C  ketoconazole (NIZORAL) 2 % shampoo APPLY ONCE DAILY TO SCALP ONCE DAILY FOR ONE WEEK THEN DECREASE TO ONCE A WEEK 11/17/19   [provider]  Multiple Vitamins-Minerals (ZINC PO) Take by mouth.    [provider]  omeprazole (PRILOSEC) 20 MG capsule Take 1 capsule (20 mg total) by mouth daily for 30 doses. 06/02/20 07/02/20  Terald Sleeper, MD    Allergies    Amoxicillin, Penicillins, and Suprax [cefixime]  Review of Systems   Review of Systems  All other systems reviewed and are negative.   Physical Exam Updated Vital Signs BP 115/89 (BP Location: Left Arm)   Pulse (!) 103   Temp 98.3 F (36.8 C) (Oral)   Resp 16   SpO2 99%   Physical Exam Vitals and nursing note reviewed.  Constitutional:      General: She is not in acute distress.    Appearance: She is well-developed.  HENT:     Head: Normocephalic  and atraumatic.  Eyes:     Conjunctiva/sclera: Conjunctivae normal.  Cardiovascular:     Rate and Rhythm: Normal rate and regular rhythm.     Heart sounds: No murmur heard.   Pulmonary:     Effort: Pulmonary effort is normal. No respiratory distress.     Breath sounds: Normal breath sounds.  Abdominal:     Palpations: Abdomen is soft.     Tenderness: There is no abdominal tenderness.  Musculoskeletal:        General: Normal range of motion.     Cervical back: Neck supple.     Comments: Lower extremities 4+/5 strength TTP over CTLS paraspinal muscles  Skin:    General: Skin is warm and dry.  Neurological:     Mental Status: She is alert and oriented to person, place, and time.     Deep Tendon Reflexes:     Reflex Scores:      Patellar reflexes are 4+ on the right side and 4+ on the left side. Psychiatric:        Mood and Affect: Mood normal.        Behavior: Behavior normal.     ED Results / Procedures / Treatments   Labs (all labs ordered are listed, but only abnormal results are displayed) Labs Reviewed  CBC WITH DIFFERENTIAL/PLATELET  COMPREHENSIVE METABOLIC PANEL  URINALYSIS, ROUTINE W REFLEX MICROSCOPIC  CBG MONITORING, ED    EKG None  Radiology DG Chest 2 View  Result Date: 06/04/2020 CLINICAL DATA:  Chest pain EXAM: CHEST - 2 VIEW COMPARISON:  Radiograph 05/26/2020, CT 05/26/2020 FINDINGS: Electronic device projects over the upper chest just to the right of midline. No consolidation, features of edema, pneumothorax, or effusion. Pulmonary vascularity is normally distributed. The cardiomediastinal contours are unremarkable. No acute osseous or soft tissue abnormality. IMPRESSION: 1.  No acute cardiopulmonary abnormality. 2. Electronic device projects over the upper anterior chest just to the right of midline. Electronically Signed   By: Kreg Shropshire M.D.   On: 06/04/2020 01:06   VAS [place] LOWER EXTREMITY VENOUS (DVT) ([facility] and [facility] 7a-7p)  Result Date: 06/03/2020   Lower Venous DVT Study Indications: Lateral thigh and calf cramping.  Comparison Study: No prior study on file Performing Technologist: Sherren Kerns RVS  Examination Guidelines: A complete evaluation includes B-mode imaging, spectral Doppler, color Doppler, and power Doppler as needed of all accessible  portions of each vessel. Bilateral testing is considered an integral part of a complete examination. Limited examinations for reoccurring indications may be performed as noted. The reflux portion of the exam is performed with the patient in reverse Trendelenburg.  +---------+---------------+---------+-----------+----------+--------------+ RIGHT    CompressibilityPhasicitySpontaneityPropertiesThrombus Aging +---------+---------------+---------+-----------+----------+--------------+ CFV      Full           Yes      Yes                                 +---------+---------------+---------+-----------+----------+--------------+ SFJ      Full                                                        +---------+---------------+---------+-----------+----------+--------------+ FV Prox  Full                                                        +---------+---------------+---------+-----------+----------+--------------+ FV Mid   Full                                                        +---------+---------------+---------+-----------+----------+--------------+ FV DistalFull                                                        +---------+---------------+---------+-----------+----------+--------------+ PFV      Full                                                        +---------+---------------+---------+-----------+----------+--------------+ POP      Full           Yes      Yes                                 +---------+---------------+---------+-----------+----------+--------------+ PTV      Full                                                         +---------+---------------+---------+-----------+----------+--------------+ PERO     Full                                                        +---------+---------------+---------+-----------+----------+--------------+   +---------+---------------+---------+-----------+----------+--------------+ LEFT  CompressibilityPhasicitySpontaneityPropertiesThrombus Aging +---------+---------------+---------+-----------+----------+--------------+ CFV      Full           Yes      Yes                                 +---------+---------------+---------+-----------+----------+--------------+ SFJ      Full                                                        +---------+---------------+---------+-----------+----------+--------------+ FV Prox  Full                                                        +---------+---------------+---------+-----------+----------+--------------+ FV Mid   Full                                                        +---------+---------------+---------+-----------+----------+--------------+ FV DistalFull                                                        +---------+---------------+---------+-----------+----------+--------------+ PFV      Full                                                        +---------+---------------+---------+-----------+----------+--------------+ POP      Full           Yes      Yes                                 +---------+---------------+---------+-----------+----------+--------------+ PTV      Full                                                        +---------+---------------+---------+-----------+----------+--------------+ PERO     Full                                                        +---------+---------------+---------+-----------+----------+--------------+     Summary: BILATERAL: - No evidence of deep vein thrombosis seen in the lower extremities, bilaterally. - RIGHT: - No  cystic structure found in the popliteal fossa.   *See table(s) above for measurements and observations. Electronically signed by Waverly Ferrarihristopher Dickson MD on 06/03/2020 at 2:01:59 PM.  Final     Procedures Procedures (including critical care time)  Medications Ordered in ED Medications - No data to display  ED Course  I have reviewed the triage vital signs and the nursing notes.  Pertinent labs & imaging results that were available during my care of the patient were reviewed by me and considered in my medical decision making (see chart for details).    MDM Rules/Calculators/A&P                          This patient complains of neck pain, urinary incontinence, this involves an extensive number of treatment options, and is a complaint that carries with it a high risk of complications and morbidity.    Differential Dx MS, transverse myelitis, cord compression, anxiety   Pertinent Labs I ordered, reviewed, and interpreted labs, which included CBC and CMP and UA, no significant electrolyte derangement.  Imaging Interpretation I ordered imaging studies which included MRs of brain and spine .   Sources Previous records obtained and reviewed multiple prior workups for chest pain and GERD.  She still complains of these symptoms, but I doubt they are related to her incontinence and hyperreflexia.     Consultants Dr. Derry Lory- Discussed the hyperreflexia, incontinence and also the MS type symptoms described in HPI.  Recommends MRs of the brain and spine w/wo.  Plan Patient signed out to Dr. Bebe Shaggy, who will continue care.  F/u on MRs.  Disposition pending MRs.    Final Clinical Impression(s) / ED Diagnoses Final diagnoses:  None    Rx / DC Orders ED Discharge Orders    None       Roxy Horseman, PA-C 06/05/20 1941    Zadie Rhine, MD 06/05/20 [phone]

## 2020-06-04 NOTE — ED Triage Notes (Signed)
Pt states that she is having CP when walking for the past 3 days, seen here recently for the same and diagnosed with GERD, took Pepcid without relief.

## 2020-06-04 NOTE — ED Notes (Signed)
Pt left ED.

## 2020-06-04 NOTE — ED Triage Notes (Signed)
Pt back to the ED today with c/o chest pain , pt hwas seen yesterday and the day before and wearing a monitor and a prescription that she did not get filed

## 2020-06-05 ENCOUNTER — Ambulatory Visit (HOSPITAL_COMMUNITY)
Admission: EM | Admit: 2020-06-05 | Discharge: 2020-06-05 | Disposition: A | Discharge status: Home / Self Care | Payer: BC Managed Care – PPO

## 2020-06-05 ENCOUNTER — Emergency Department (HOSPITAL_COMMUNITY): Payer: BC Managed Care – PPO

## 2020-06-05 ENCOUNTER — Other Ambulatory Visit: Payer: Self-pay

## 2020-06-05 DIAGNOSIS — M4184 Other forms of scoliosis, thoracic region: Secondary | ICD-10-CM | POA: Diagnosis not present

## 2020-06-05 DIAGNOSIS — E069 Thyroiditis, unspecified: Secondary | ICD-10-CM | POA: Diagnosis not present

## 2020-06-05 DIAGNOSIS — F331 Major depressive disorder, recurrent, moderate: Secondary | ICD-10-CM

## 2020-06-05 DIAGNOSIS — M5126 Other intervertebral disc displacement, lumbar region: Secondary | ICD-10-CM | POA: Diagnosis not present

## 2020-06-05 DIAGNOSIS — J3489 Other specified disorders of nose and nasal sinuses: Secondary | ICD-10-CM | POA: Diagnosis not present

## 2020-06-05 DIAGNOSIS — R002 Palpitations: Secondary | ICD-10-CM | POA: Insufficient documentation

## 2020-06-05 DIAGNOSIS — R350 Frequency of micturition: Secondary | ICD-10-CM | POA: Diagnosis not present

## 2020-06-05 DIAGNOSIS — M47814 Spondylosis without myelopathy or radiculopathy, thoracic region: Secondary | ICD-10-CM | POA: Diagnosis not present

## 2020-06-05 DIAGNOSIS — E876 Hypokalemia: Secondary | ICD-10-CM | POA: Diagnosis not present

## 2020-06-05 DIAGNOSIS — Q7649 Other congenital malformations of spine, not associated with scoliosis: Secondary | ICD-10-CM | POA: Diagnosis not present

## 2020-06-05 DIAGNOSIS — M50222 Other cervical disc displacement at C5-C6 level: Secondary | ICD-10-CM | POA: Diagnosis not present

## 2020-06-05 DIAGNOSIS — F411 Generalized anxiety disorder: Secondary | ICD-10-CM | POA: Insufficient documentation

## 2020-06-05 DIAGNOSIS — R0789 Other chest pain: Secondary | ICD-10-CM | POA: Diagnosis not present

## 2020-06-05 DIAGNOSIS — Z87891 Personal history of nicotine dependence: Secondary | ICD-10-CM | POA: Insufficient documentation

## 2020-06-05 DIAGNOSIS — G35 Multiple sclerosis: Secondary | ICD-10-CM | POA: Diagnosis not present

## 2020-06-05 DIAGNOSIS — M47812 Spondylosis without myelopathy or radiculopathy, cervical region: Secondary | ICD-10-CM | POA: Diagnosis not present

## 2020-06-05 DIAGNOSIS — F419 Anxiety disorder, unspecified: Secondary | ICD-10-CM | POA: Diagnosis not present

## 2020-06-05 DIAGNOSIS — R079 Chest pain, unspecified: Secondary | ICD-10-CM | POA: Diagnosis not present

## 2020-06-05 LAB — CBC WITH DIFFERENTIAL/PLATELET
Abs Immature Granulocytes: 0.02 10*3/uL (ref 0.00–0.07)
Basophils Absolute: 0 10*3/uL (ref 0.0–0.1)
Basophils Relative: 1 %
Eosinophils Absolute: 0 10*3/uL (ref 0.0–0.5)
Eosinophils Relative: 1 %
HCT: 38.3 % (ref 36.0–46.0)
Hemoglobin: 13.3 g/dL (ref 12.0–15.0)
Immature Granulocytes: 0 %
Lymphocytes Relative: 38 %
Lymphs Abs: 2.7 10*3/uL (ref 0.7–4.0)
MCH: 33.4 pg (ref 26.0–34.0)
MCHC: 34.7 g/dL (ref 30.0–36.0)
MCV: 96.2 fL (ref 80.0–100.0)
Monocytes Absolute: 0.6 10*3/uL (ref 0.1–1.0)
Monocytes Relative: 8 %
Neutro Abs: 3.8 10*3/uL (ref 1.7–7.7)
Neutrophils Relative %: 52 %
Platelets: 231 10*3/uL (ref 150–400)
RBC: 3.98 MIL/uL (ref 3.87–5.11)
RDW: 11.5 % (ref 11.5–15.5)
WBC: 7.2 10*3/uL (ref 4.0–10.5)
nRBC: 0 % (ref 0.0–0.2)

## 2020-06-05 LAB — COMPREHENSIVE METABOLIC PANEL
ALT: 13 U/L (ref 0–44)
AST: 14 U/L — ABNORMAL LOW (ref 15–41)
Albumin: 4.4 g/dL (ref 3.5–5.0)
Alkaline Phosphatase: 36 U/L — ABNORMAL LOW (ref 38–126)
Anion gap: 12 (ref 5–15)
BUN: 15 mg/dL (ref 6–20)
CO2: 23 mmol/L (ref 22–32)
Calcium: 9.7 mg/dL (ref 8.9–10.3)
Chloride: 104 mmol/L (ref 98–111)
Creatinine, Ser: 0.98 mg/dL (ref 0.44–1.00)
GFR calc Af Amer: >60 mL/min (ref >60)
GFR calc non Af Amer: >60 mL/min (ref >60)
Glucose, Bld: 101 mg/dL — ABNORMAL HIGH (ref 70–99)
Potassium: 3.5 mmol/L (ref 3.5–5.1)
Sodium: 139 mmol/L (ref 135–145)
Total Bilirubin: 1.4 mg/dL — ABNORMAL HIGH (ref 0.3–1.2)
Total Protein: 7.3 g/dL (ref 6.5–8.1)

## 2020-06-05 LAB — TROPONIN I (HIGH SENSITIVITY): Troponin I (High Sensitivity): <2 ng/L (ref <18)

## 2020-06-05 IMAGING — MR MR LUMBAR SPINE WO/W CM
4 of 7 series · 24 of 48 positions shown · IV contrast (MH)
Comparison: Thoracic MRI today reported separately. CT Abdomen and
Pelvis [DATE].

CLINICAL DATA: 29-year-old female with multiple sclerosis. Positive
for [VB] last month. Persistent chest pain. Palpitations. Recent
upper and lower extremity pain.

EXAM:
MRI LUMBAR SPINE WITHOUT AND WITH CONTRAST
TECHNIQUE: Multiplanar and multiecho pulse sequences of the lumbar spine were
obtained without and with intravenous contrast.
CONTRAST:  6.5mL GADAVIST GADOBUTROL 1 MMOL/ML IV SOLN

[Series 10: T2 · sagittal · 4.0mm · 0.73mm/px · 5 of 18 slices shown (1 of 2)]
[im 1/18]
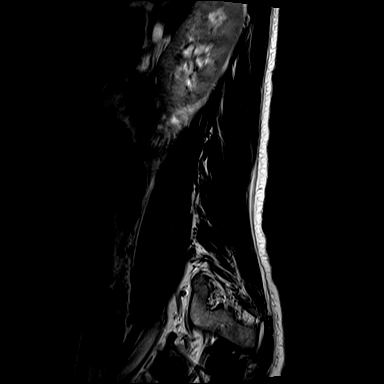
[im 5/18]
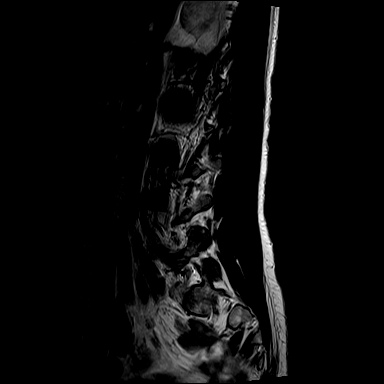
[im 9/18]
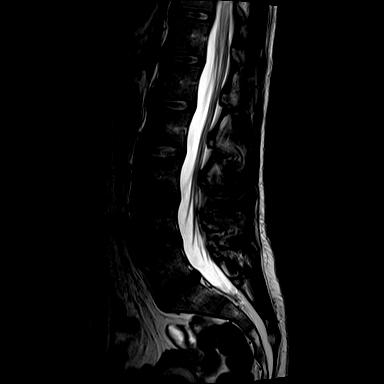
[im 13/18]
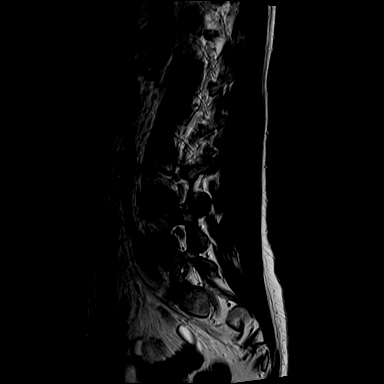
[im 18/18]
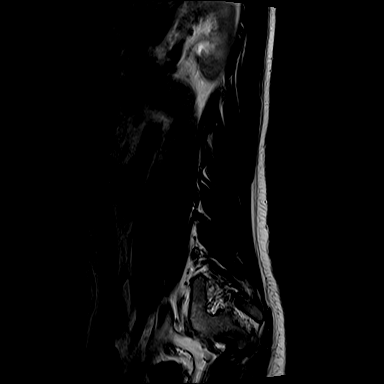

[Series 11: T1 · sagittal · 4.0mm · 0.88mm/px · 5 of 18 slices shown (1 of 2)]
[im 1/18]
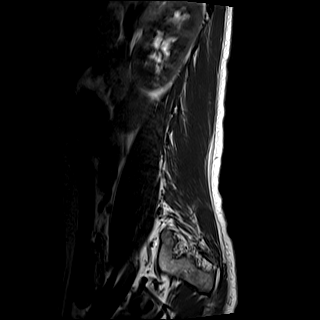
[im 5/18]
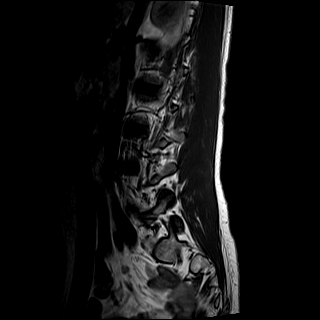
[im 9/18]
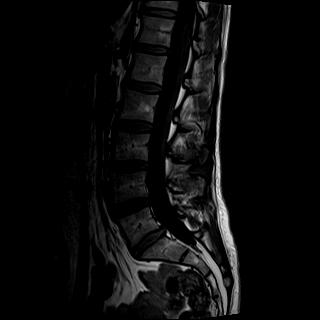
[im 13/18]
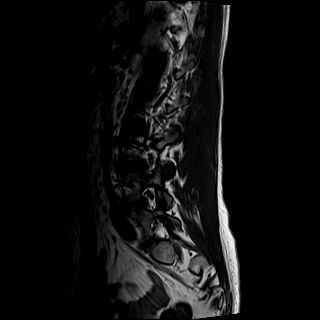
[im 18/18]
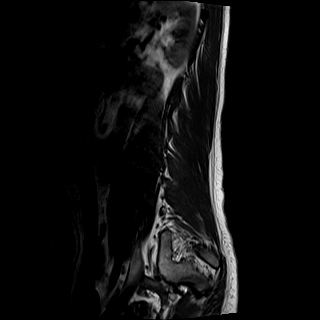

[Series 12: T2 · axial · 4.0mm · 0.57mm/px · z∈[-271,-65]mm · 8 of 38 slices shown (2 of 2)]
[im 1/38]
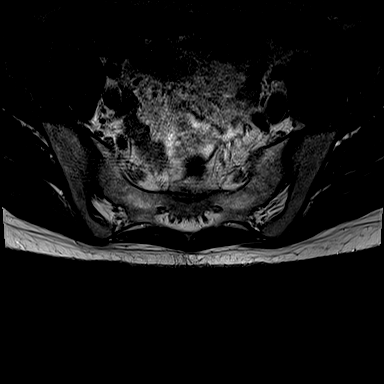
[im 5/38]
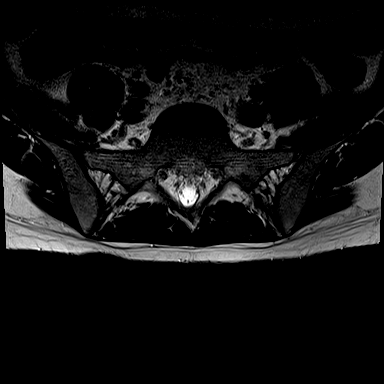
[im 13/38]
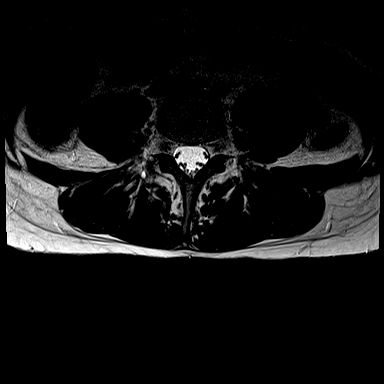
[im 17/38]
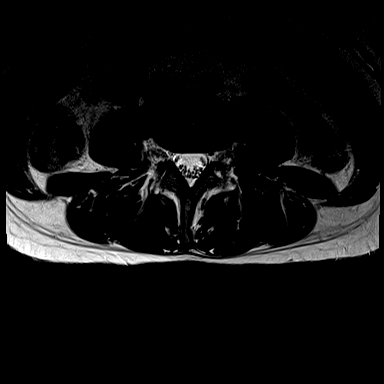
[im 21/38]
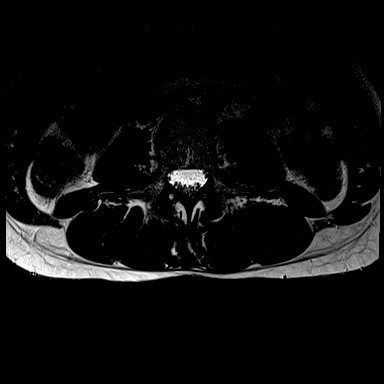
[im 25/38]
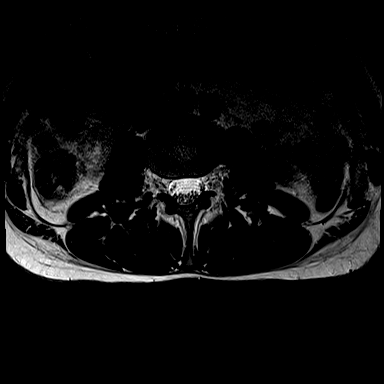
[im 33/38]
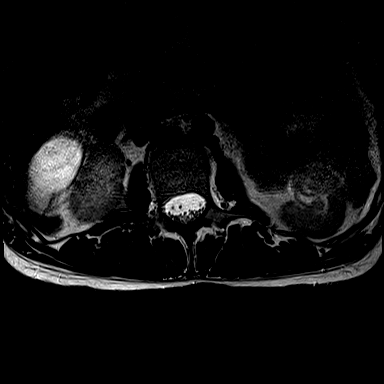
[im 38/38]
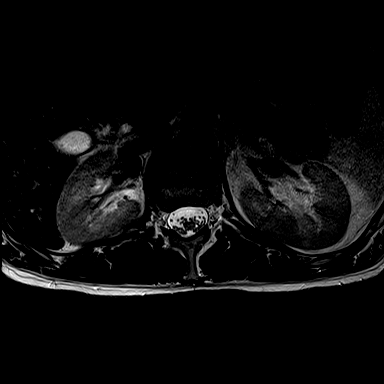

[Series 13: T1 · axial · 4.0mm · 0.34mm/px · z∈[-271,-90]mm · 6 of 38 slices shown (2 of 2)]
[im 1/38]
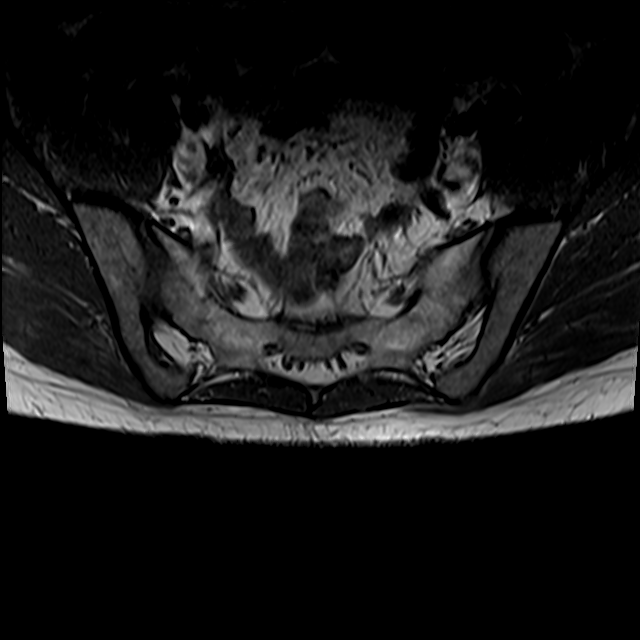
[im 5/38]
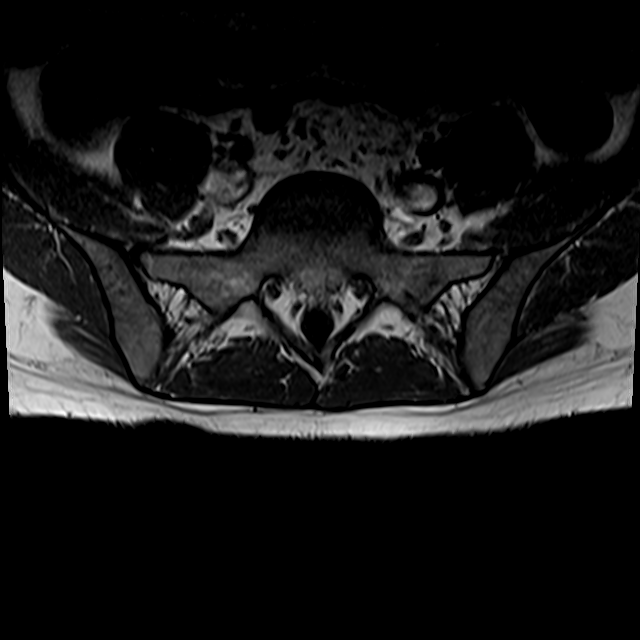
[im 13/38]
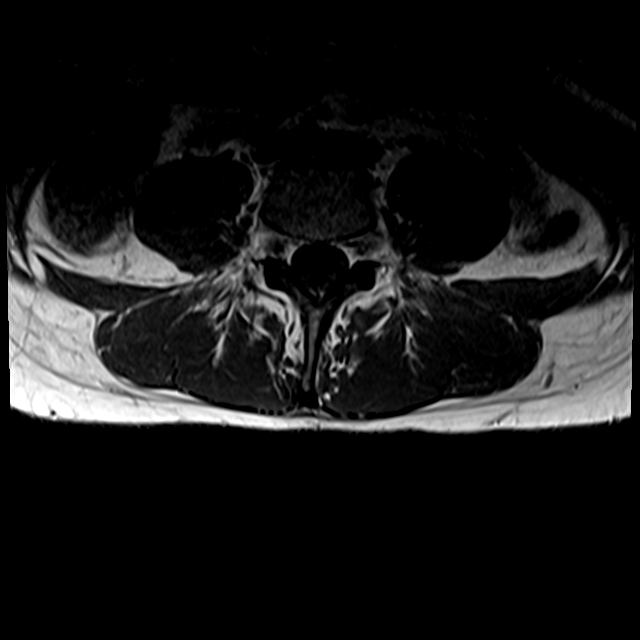
[im 17/38]
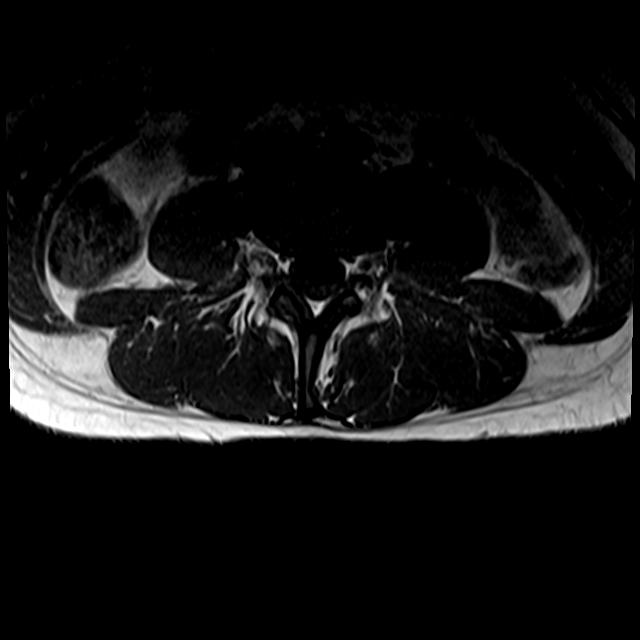
[im 21/38]
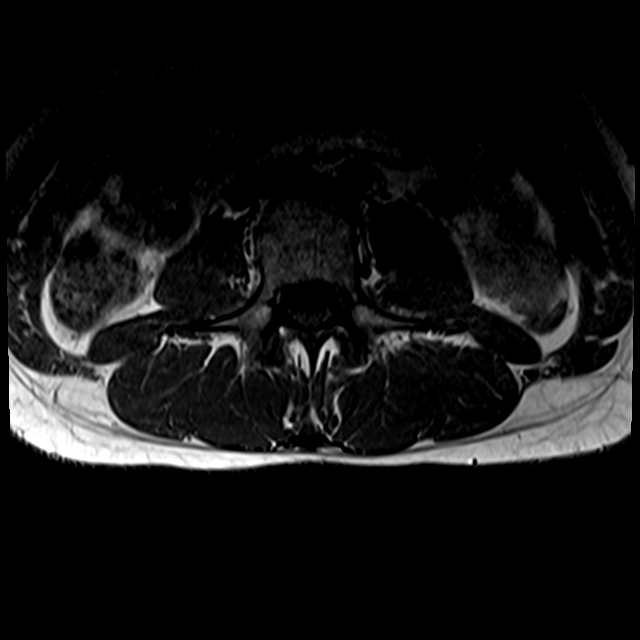
[im 33/38]
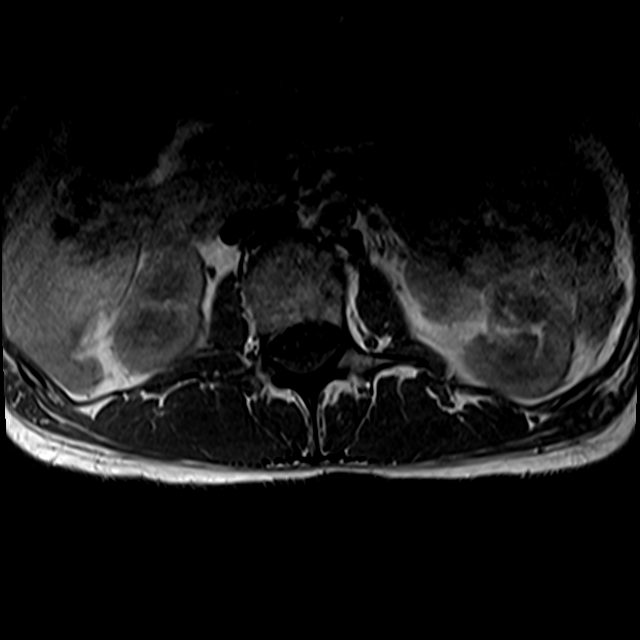

[24 of 48 positions shown; findings below may reference images not displayed]

FINDINGS: Segmentation: Normal, concordant with the thoracic spine numbering
today. Hypoplastic ribs at T12.

Alignment: Minor dextroconvex lumbar scoliosis with preserved lumbar
lordosis. No spondylolisthesis.

Vertebrae: No marrow edema or evidence of acute osseous abnormality.
Visualized bone marrow signal is within normal limits. Intact
visible sacrum and SI joints.

Conus medullaris and cauda equina: Conus extends to the T12-L1
level. No lower spinal cord or conus signal abnormality. Capacious
lumbar spinal canal. Normal cauda equina. No abnormal intradural
enhancement. No dural thickening.

Paraspinal and other soft tissues: Negative.

Disc levels:

Visible lower thoracic spine through L2-L3 are negative.

L3-L4: Mild disc desiccation. Minor disc bulging. Borderline to mild
left L3 neural foraminal stenosis.

L4-L5:  Mild facet hypertrophy.  Otherwise negative.

L5-S1: Mild disc desiccation. Small broad-based disc protrusion
toward the right lateral recess (series 12, image 32). But capacious
canal, no convincing stenosis.
IMPRESSION: 1. No acute or inflammatory process identified in the lumbar spine.
2. Capacious lumbar spinal canal with no spinal stenosis.
A small L5-S1 disc protrusion is in proximity to the descending
right S1 nerve roots in the lateral recess.
Mild disc bulging at L3-L4 results in up to mild left L3 neural
foraminal stenosis.

## 2020-06-05 IMAGING — MR MR THORACIC SPINE WO/W CM
5 of 9 series · 23 of 48 positions shown · IV contrast (Contrast agent)
Comparison: Head and cervical spine MRI today reported separately.
Chest CTA [DATE].

CLINICAL DATA: 29-year-old female with multiple sclerosis. Positive
for [HV] last month. Persistent chest pain. Palpitations. Recent
upper and lower extremity pain.

EXAM:
MRI THORACIC WITHOUT AND WITH CONTRAST
TECHNIQUE: Multiplanar and multiecho pulse sequences of the thoracic spine were
obtained without and with intravenous contrast.
CONTRAST:  6.5mL GADAVIST GADOBUTROL 1 MMOL/ML IV SOLN

[Series 17: T1 · sagittal · 6.0mm · 1.23mm/px · 1 of 9 slices shown (1 of 3)]
[im 1/9]
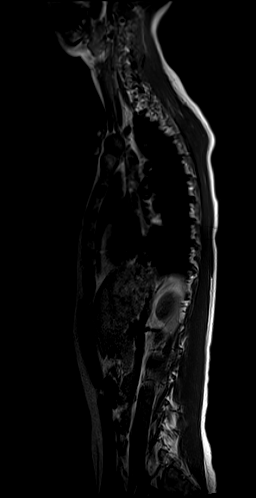

[Series 18: T2 · sagittal · 3.0mm · 0.76mm/px · 3 of 17 slices shown (1 of 2)]
[im 1/17]
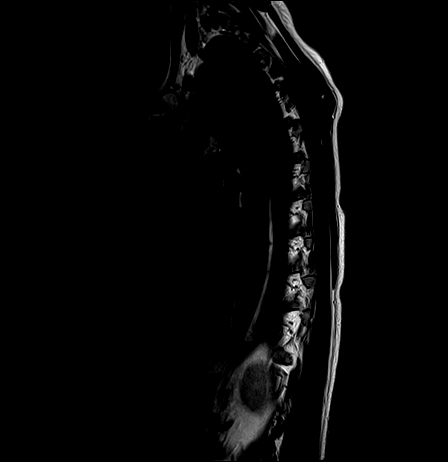
[im 9/17]
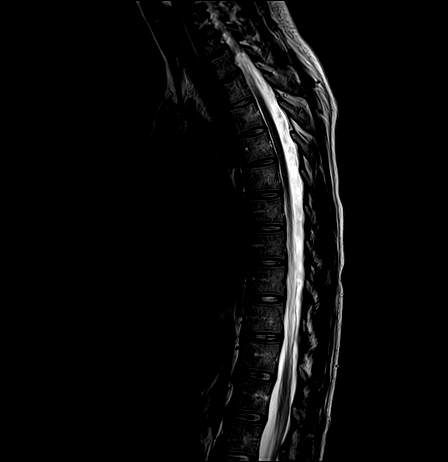
[im 17/17]
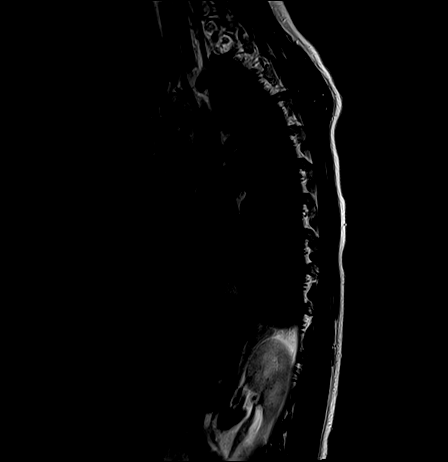

[Series 19: T1 · sagittal · 3.0mm · 0.76mm/px · 4 of 17 slices shown (2 of 3)]
[im 1/17]
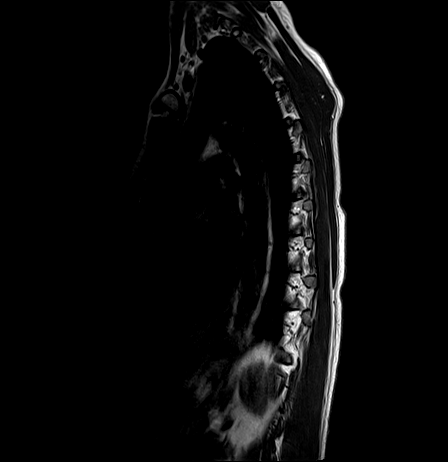
[im 6/17]
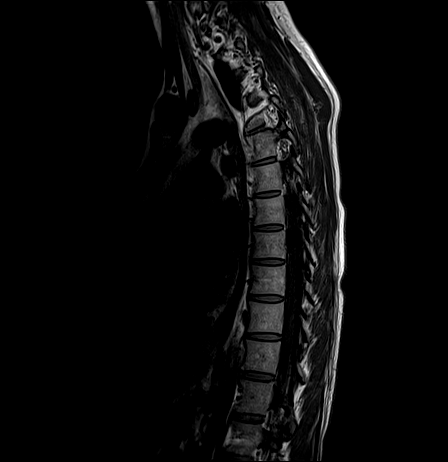
[im 11/17]
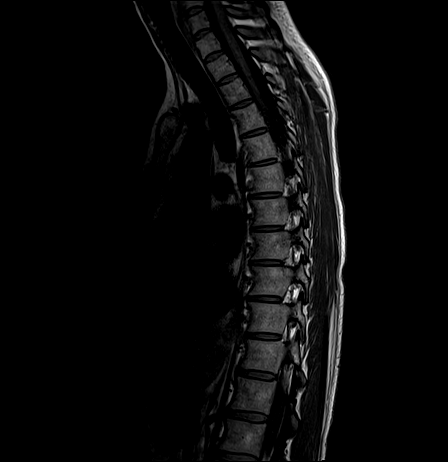
[im 17/17]
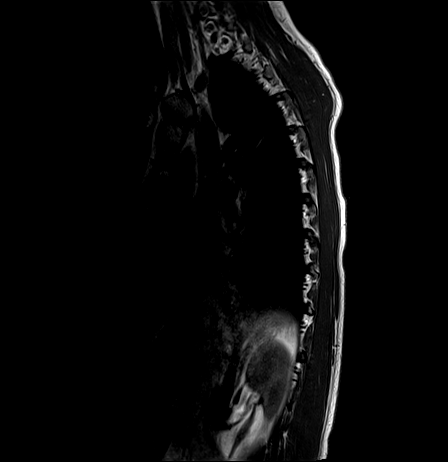

[Series 21: T2 · axial · 4.0mm · 0.59mm/px · z∈[-139,+110]mm · 8 of 39 slices shown (2 of 2)]
[im 1/39]
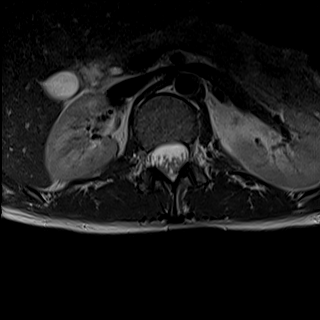
[im 6/39]
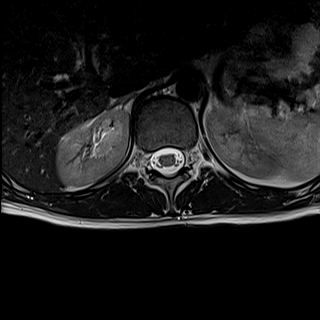
[im 11/39]
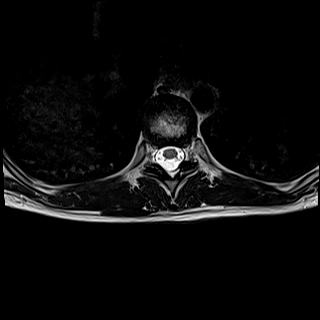
[im 17/39]
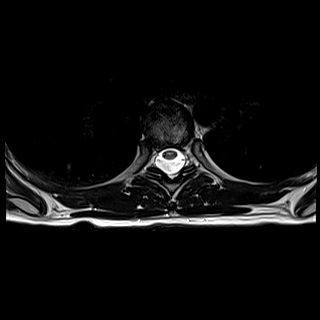
[im 22/39]
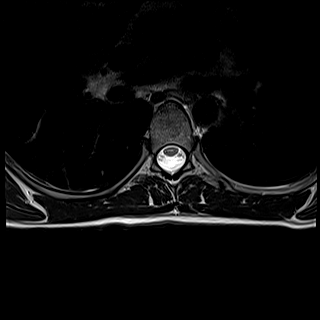
[im 28/39]
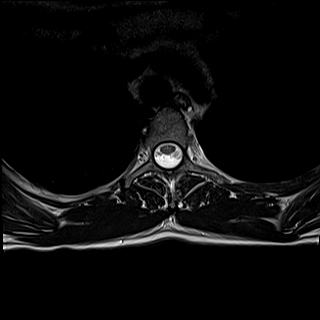
[im 33/39]
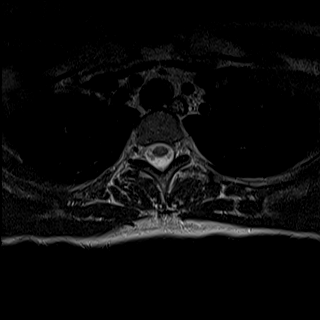
[im 39/39]
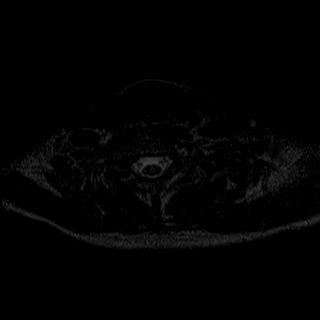

[Series 23: T1 · axial · non-contrast · 4.0mm · 0.31mm/px · z∈[-139,+79]mm · 7 of 39 slices shown (3 of 3)]
[im 1/39]
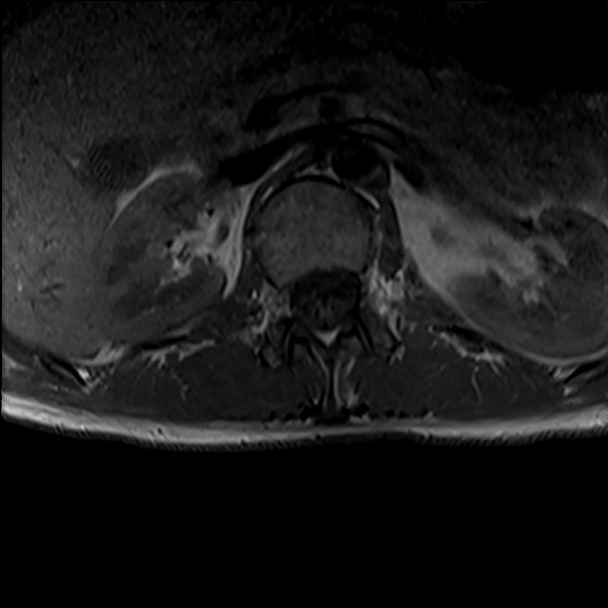
[im 6/39]
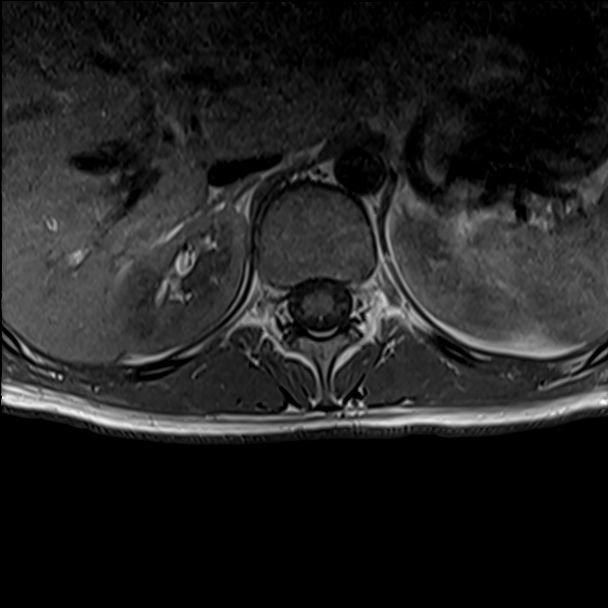
[im 11/39]
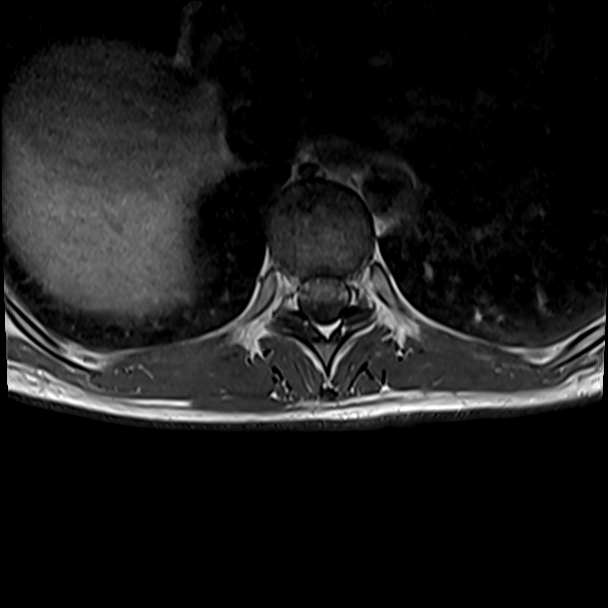
[im 17/39]
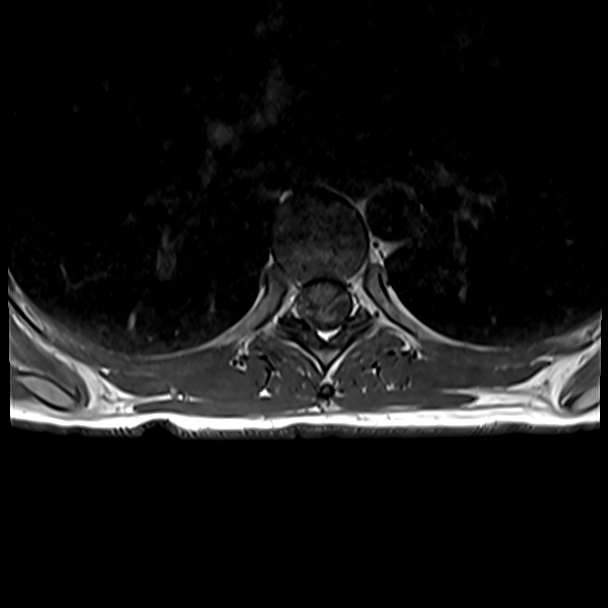
[im 22/39]
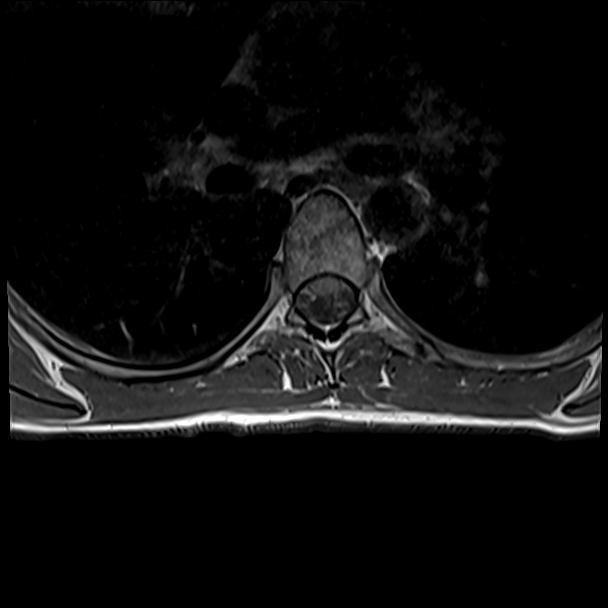
[im 28/39]
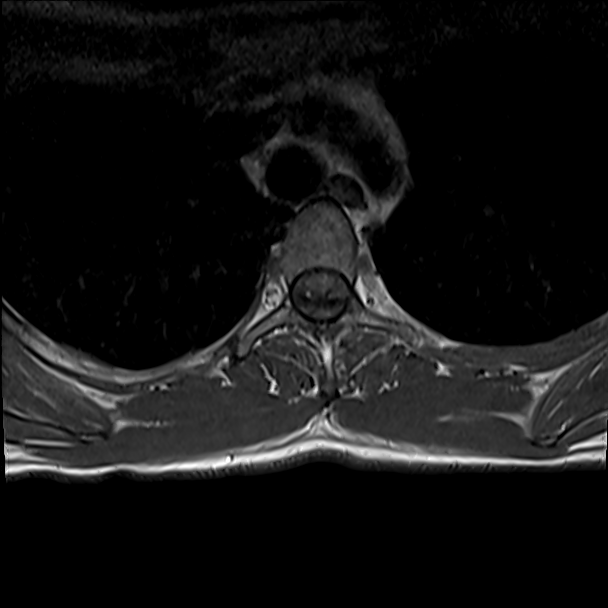
[im 33/39]
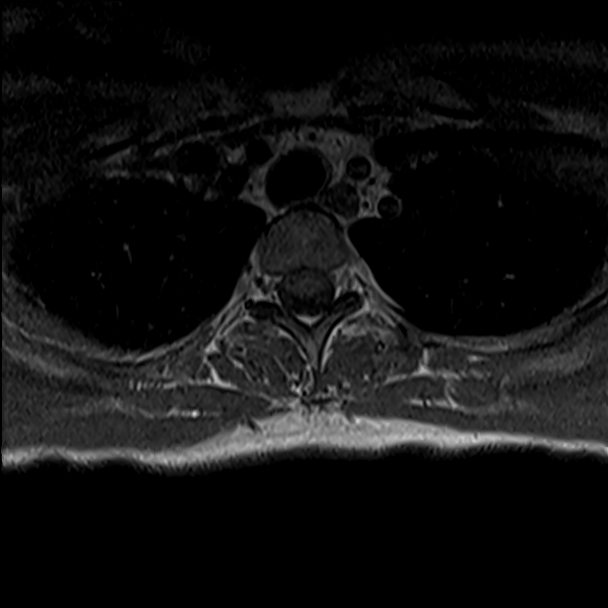

[23 of 48 positions shown; findings below may reference images not displayed]

FINDINGS: Limited cervical spine imaging:  Reported separately today.

Thoracic spine segmentation: Evidence of small C7 cervical ribs, and
also hypoplastic ribs at T12. But otherwise normal.

Alignment: Minimal upper thoracic spine scoliosis. Normal thoracic
kyphosis.

Vertebrae: No marrow edema or evidence of acute osseous abnormality.
Visualized bone marrow signal is within normal limits.

Cord: Normal. The conus medullaris appears normal at T12-L1.
Capacious thoracic spinal canal. No abnormal intradural enhancement.
No dural thickening.

Paraspinal and other soft tissues: Negative.

Disc levels:

T1-T2: Mild left foraminal disc bulge and/or endplate spurring. But
no significant left T1 foraminal stenosis.

All other thoracic levels are negative.
IMPRESSION: 1. Normal thoracic spinal cord.
2. Minor upper thoracic scoliosis with minimal T1-T2 degeneration.
Capacious spinal canal with no stenosis.

## 2020-06-05 IMAGING — MR MR HEAD WO/W CM
14 of 17 series · 32 of 48 positions shown · IV contrast (gadavist)
Comparison: Report of head CT [DATE] (no images available).

CLINICAL DATA: 29-year-old female with multiple sclerosis. Positive
for [ML] last month. Persistent chest pain. Palpitations. Recent
upper and lower extremity pain.

EXAM:
MRI HEAD WITHOUT AND WITH CONTRAST
TECHNIQUE: Multiplanar, multiecho pulse sequences of the brain and surrounding
structures were obtained without and with intravenous contrast.
CONTRAST:  6.5mL GADAVIST GADOBUTROL 1 MMOL/ML IV SOLN

[Series 5: DWI · axial · 3.0mm · 0.88mm/px · z∈[-61,+86]mm · 5 of 100 slices shown (1 of 4)]
[im 1/100]
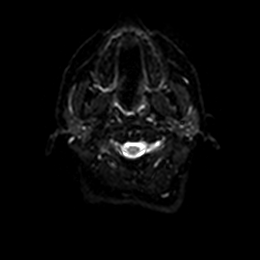
[im 25/100]
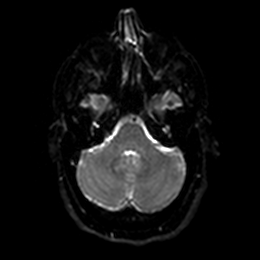
[im 50/100]
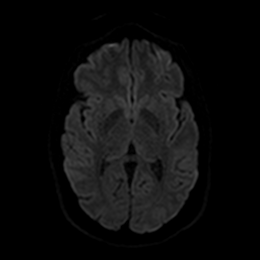
[im 75/100]
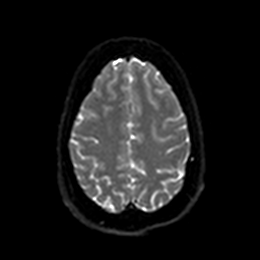
[im 100/100]
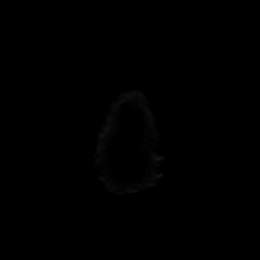

[Series 6: DWI · axial · 3.0mm · 0.88mm/px · z∈[-61,+86]mm · 2 of 50 slices shown (2 of 4)]
[im 1/50]
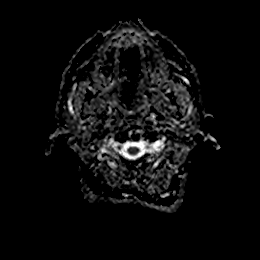
[im 50/50]
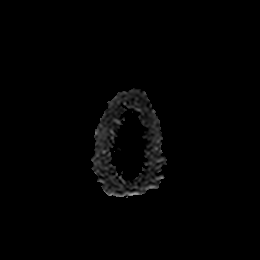

[Series 7: DWI · coronal · 4.0mm · 0.88mm/px · 3 of 66 slices shown (3 of 4)]
[im 1/66]
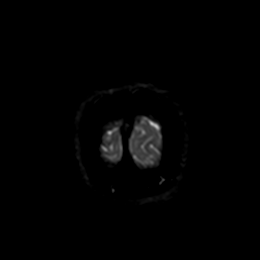
[im 33/66]
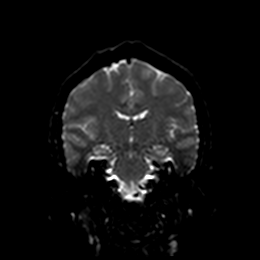
[im 66/66]
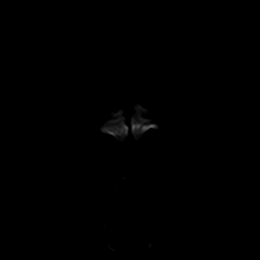

[Series 8: DWI · coronal · 4.0mm · 0.88mm/px · 1 of 33 slices shown (4 of 4)]
[im 1/33]
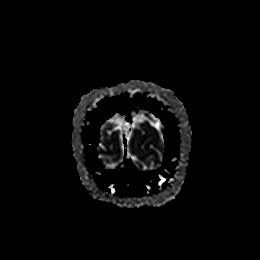

[Series 9: T1 · sagittal · 5.0mm · 0.75mm/px · 1 of 23 slices shown]
[im 1/23]
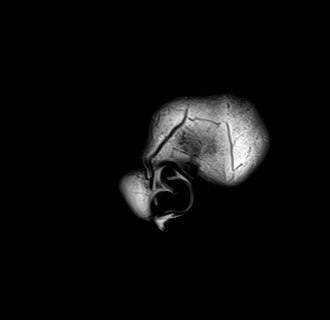

[Series 10: T2 · axial · 5.0mm · 0.72mm/px · 1 of 25 slices shown (1 of 2)]
[im 1/25]
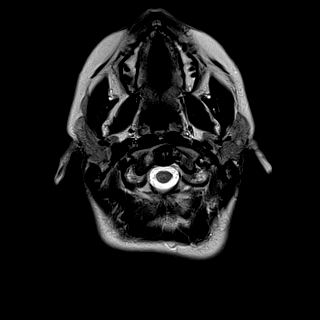

[Series 11: FLAIR · axial · 5.0mm · 0.45mm/px · 1 of 25 slices shown]
[im 1/25]
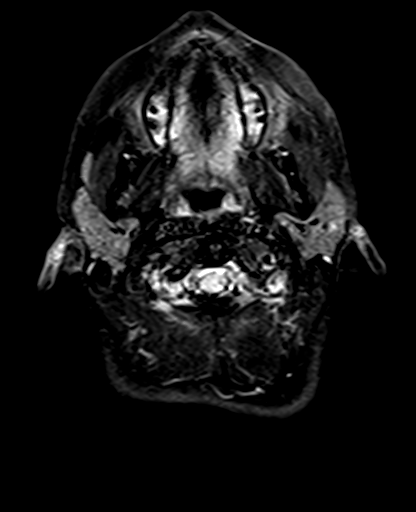

[Series 13: pha_images · axial · 3.0mm · 0.90mm/px · z∈[-71,+102]mm · 3 of 57 slices shown]
[im 1/57]
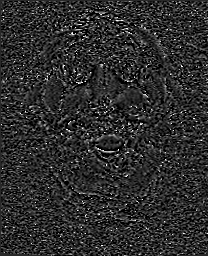
[im 29/57]
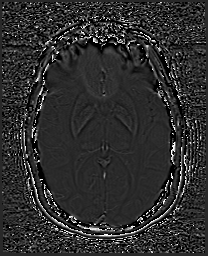
[im 57/57]
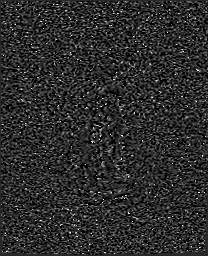

[Series 14: swi_images · axial · 3.0mm · 0.90mm/px · z∈[-71,+105]mm · 3 of 60 slices shown]
[im 1/60]
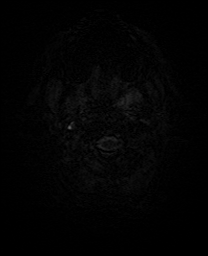
[im 30/60]
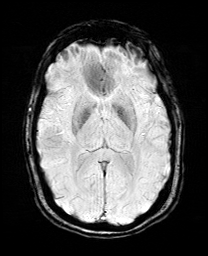
[im 60/60]
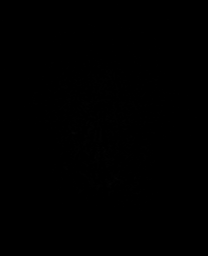

[Series 16: T2 · coronal · 5.0mm · 0.34mm/px · 1 of 29 slices shown (2 of 2)]
[im 1/29]
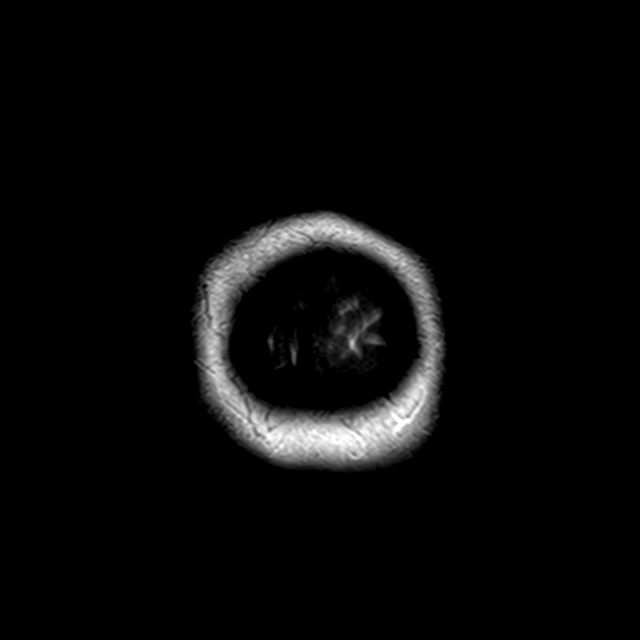

[Series 17: t2_space_dark-fluid_sag_p2_ns-ir · sagittal · 1.0mm · 0.49mm/px · 8 of 192 slices shown]
[im 1/192]
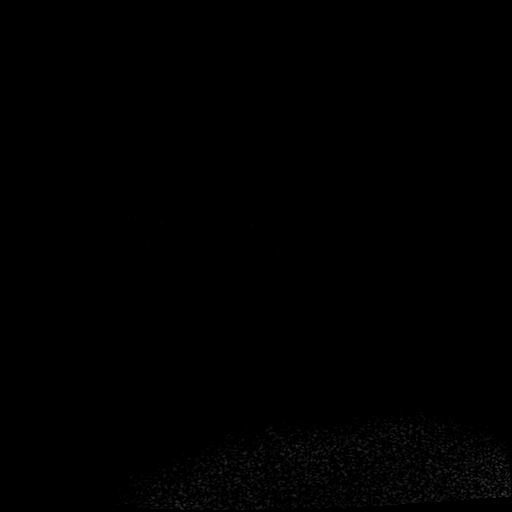
[im 24/192]
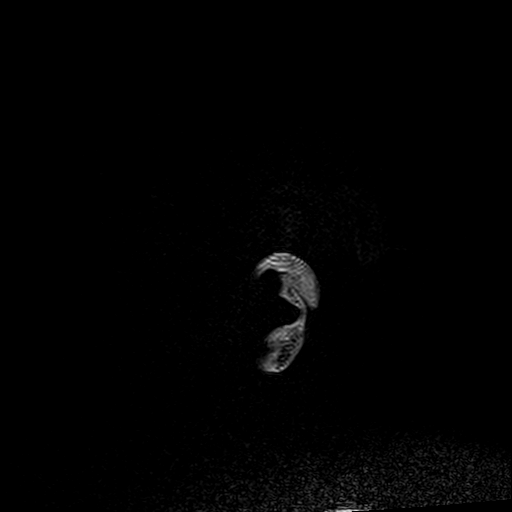
[im 48/192]
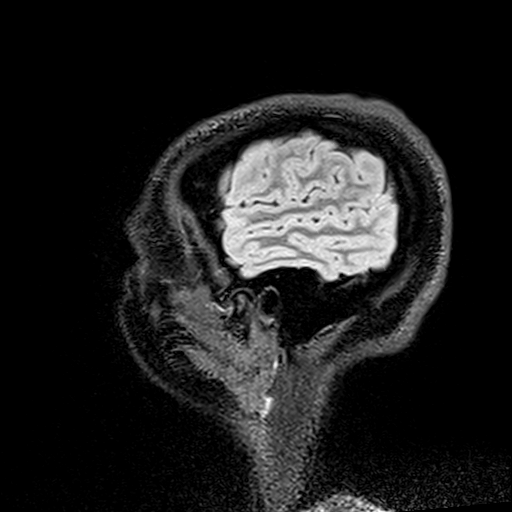
[im 72/192]
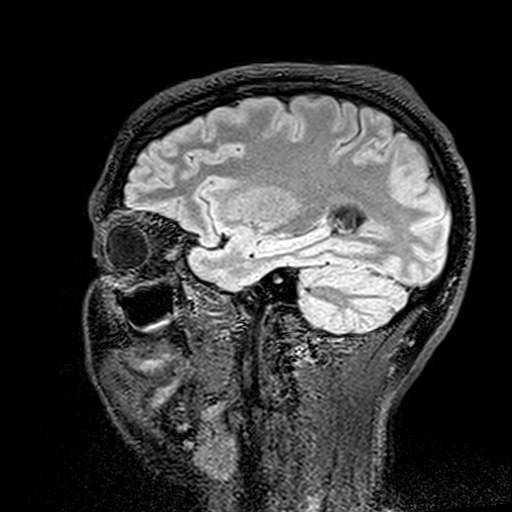
[im 120/192]
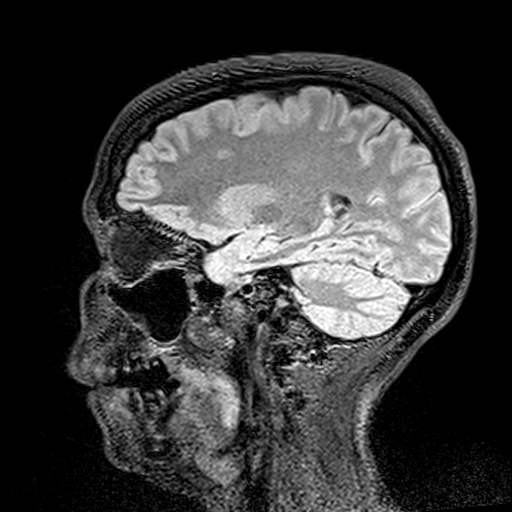
[im 144/192]
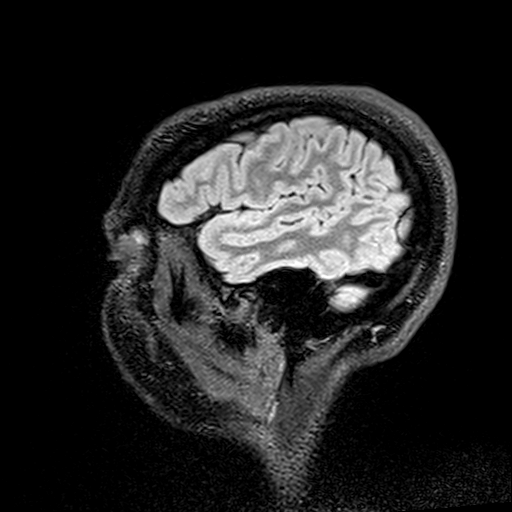
[im 168/192]
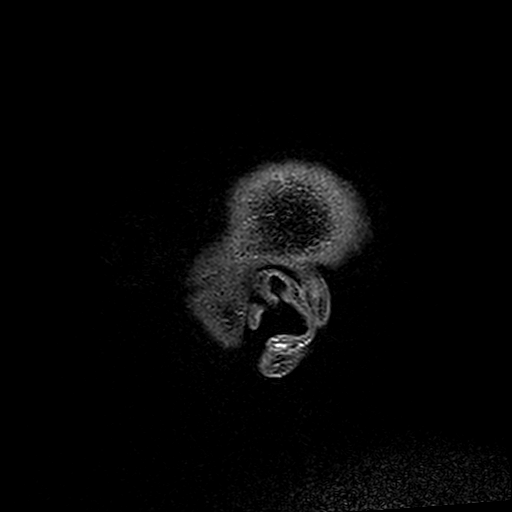
[im 192/192]
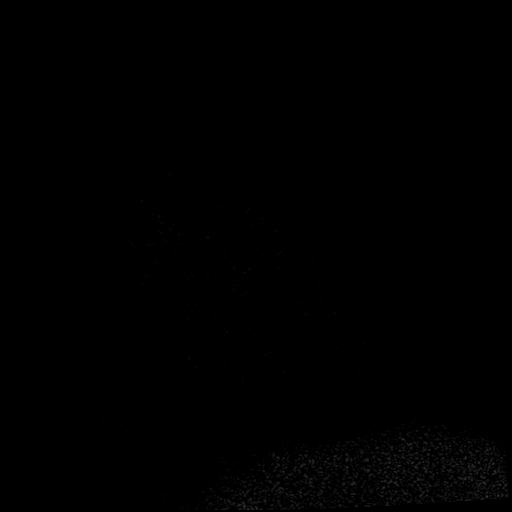

[Series 18: t2_space_dark-fluid_sag_p2_ns-ir_mpr_ axial · axial · 1.0mm · 0.45mm/px · 1 of 120 slices shown]
[im 1/120]
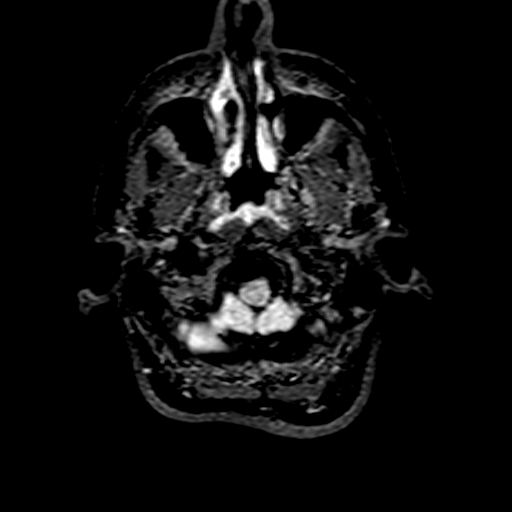

[Series 34: T1 post-contrast · coronal · 5.0mm · 0.34mm/px · 1 of 28 slices shown (1 of 2)]
[im 1/28]
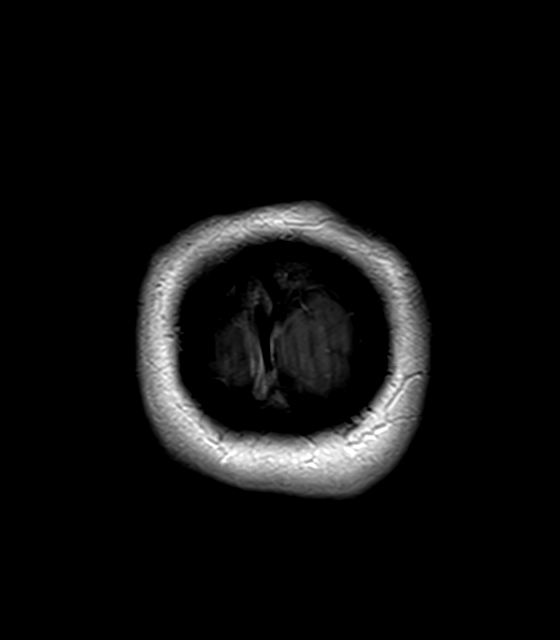

[Series 35: T1 post-contrast · sagittal · 5.0mm · 0.72mm/px · 1 of 23 slices shown (2 of 2)]
[im 1/23]
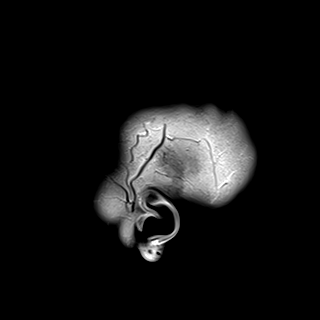

[32 of 48 positions shown; findings below may reference images not displayed]

FINDINGS: Brain: Normal cerebral volume. No restricted diffusion to suggest
acute infarction. No midline shift, mass effect, evidence of mass
lesion, ventriculomegaly, extra-axial collection or acute
intracranial hemorrhage. Cervicomedullary junction and pituitary are
within normal limits.

Gray and white matter signal is within normal limits throughout the
brain. Only a punctate solitary nonspecific focus of increased FLAIR
hyperintensity is identified in the left cerebral white matter on
series 18, image 74 no encephalomalacia or chronic cerebral blood
products. Deep gray nuclei, brainstem and cerebellum are within
normal limits.

No abnormal enhancement identified.  No dural thickening.

Vascular: Major intracranial vascular flow voids are preserved. The
major dural venous sinuses are enhancing and appear to be patent.
The right transverse and sigmoid sinus appear dominant along with
the right IJ bulb.

Skull and upper cervical spine: Cervical spine detailed separately.
Normal visible bone marrow signal.

Sinuses/Orbits: Mild motion artifact of the orbits which appear
grossly normal. Scattered mild paranasal sinus mucosal thickening,
mostly on the left. No sinus fluid levels.

Other: Mastoids are clear. Visible internal auditory structures
appear normal. Scalp and face soft tissues appear negative.
IMPRESSION: 1.  MRI appearance of the brain is within normal limits.
2. Mild paranasal sinus mucosal thickening, appears inconsequential.

## 2020-06-05 IMAGING — MR MR CERVICAL SPINE WO/W CM
7 of 9 series · 26 of 48 positions shown · IV contrast (gadavist)
Comparison: Brain MRI today reported separately.

CLINICAL DATA: 29-year-old female with multiple sclerosis. Positive
for [H7] last month. Persistent chest pain. Palpitations. Recent
upper and lower extremity pain.

EXAM:
MRI CERVICAL SPINE WITHOUT AND WITH CONTRAST
TECHNIQUE: Multiplanar and multiecho pulse sequences of the cervical spine, to
include the craniocervical junction and cervicothoracic junction,
were obtained without and with intravenous contrast.
CONTRAST:  6.5mL GADAVIST GADOBUTROL 1 MMOL/ML IV SOLN

[Series 5: T2 · sagittal · 3.0mm · 0.69mm/px · 2 of 15 slices shown (1 of 2)]
[im 1/15]
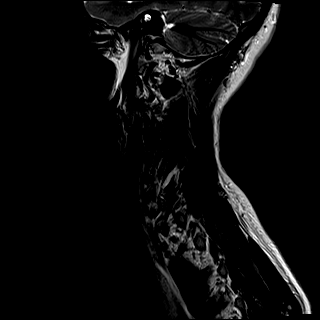
[im 15/15]
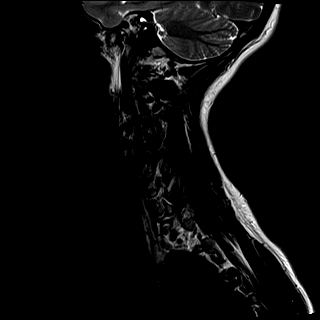

[Series 7: STIR · sagittal · 3.0mm · 0.86mm/px · 2 of 15 slices shown]
[im 1/15]
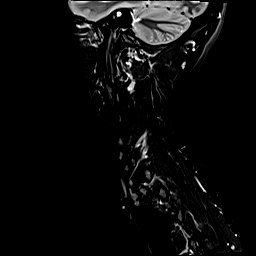
[im 15/15]
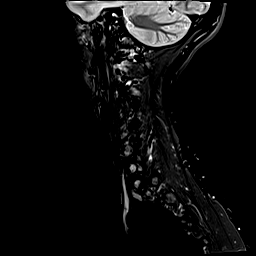

[Series 8: T2 · axial · 3.0mm · 0.66mm/px · z∈[-123,+4]mm · 6 of 40 slices shown (2 of 2)]
[im 1/40]
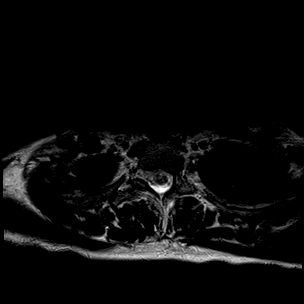
[im 8/40]
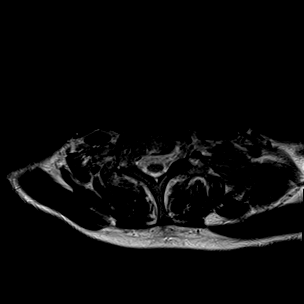
[im 16/40]
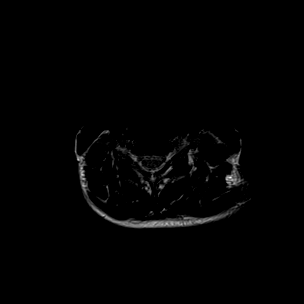
[im 24/40]
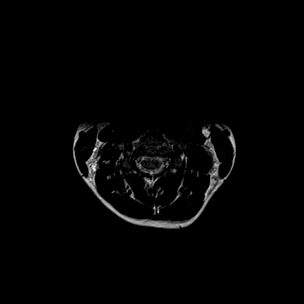
[im 32/40]
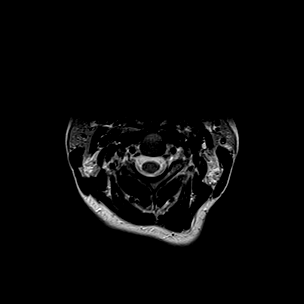
[im 40/40]
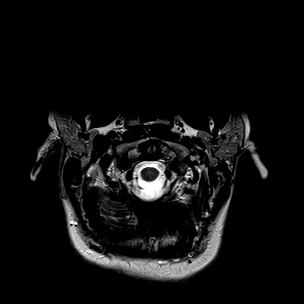

[Series 11: T1 · sagittal · 3.0mm · 0.69mm/px · 2 of 15 slices shown (1 of 2)]
[im 1/15]
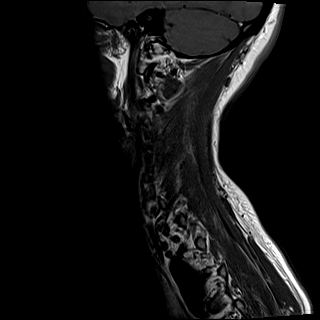
[im 15/15]
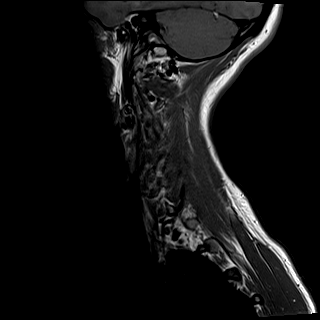

[Series 12: T1 · axial · 3.0mm · 0.39mm/px · z∈[-123,+4]mm · 6 of 40 slices shown (2 of 2)]
[im 1/40]
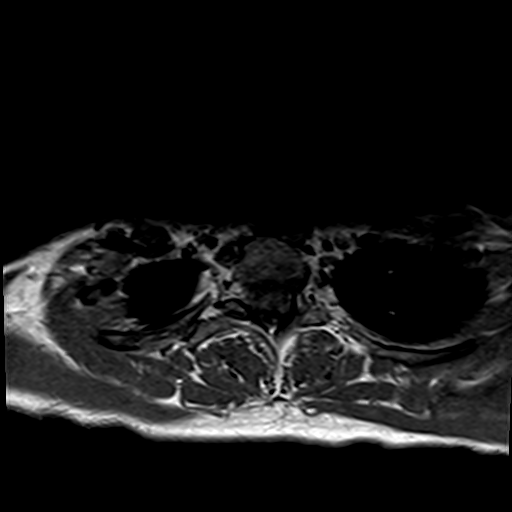
[im 8/40]
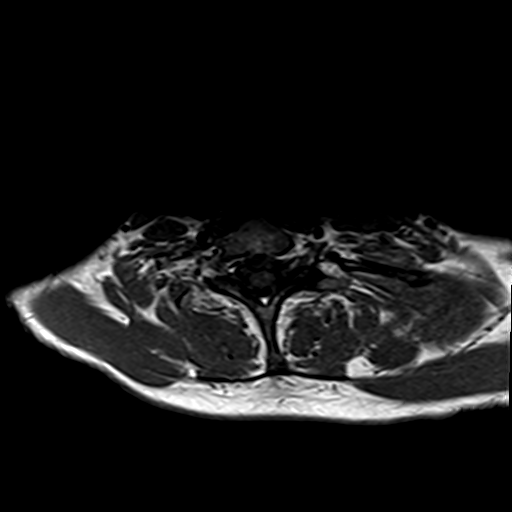
[im 16/40]
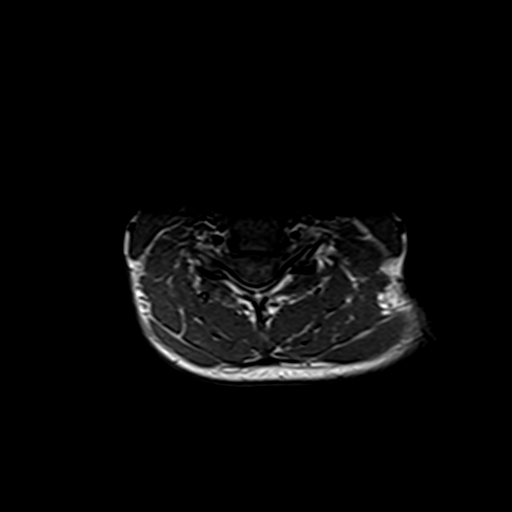
[im 24/40]
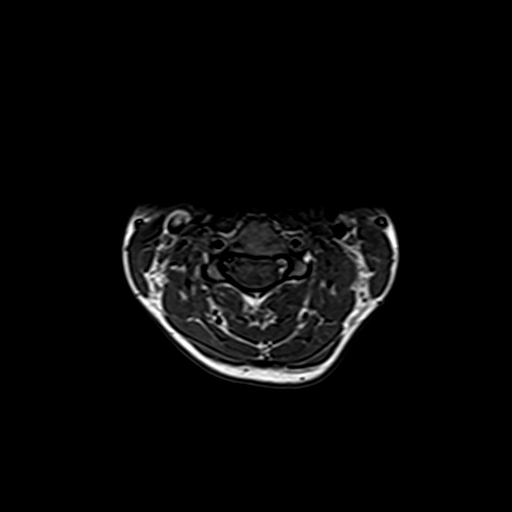
[im 32/40]
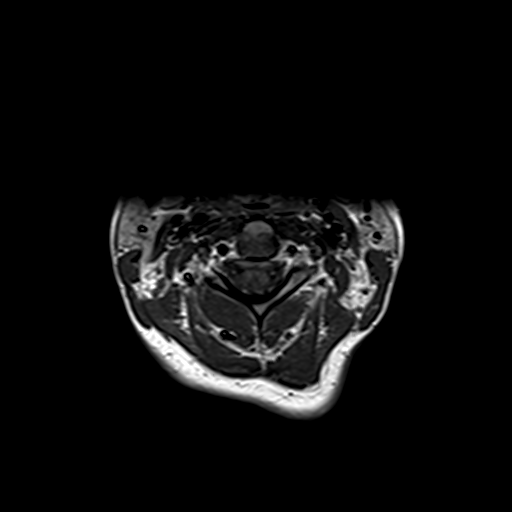
[im 40/40]
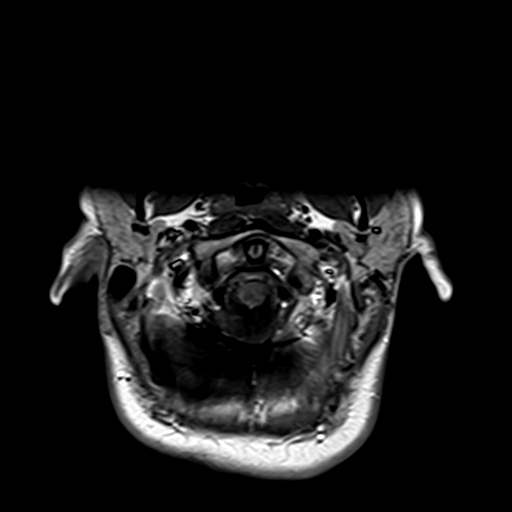

[Series 17: T1 fat-sat post-contrast · sagittal · 3.0mm · 0.43mm/px · 2 of 15 slices shown]
[im 1/15]
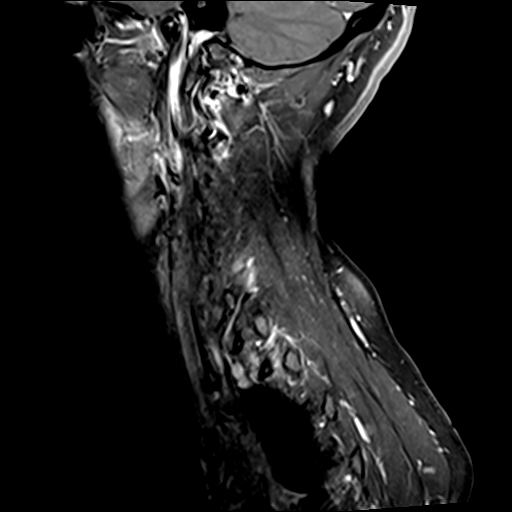
[im 15/15]
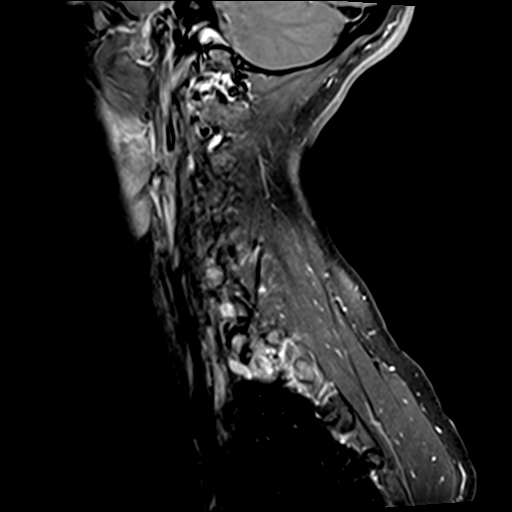

[Series 18: T1 post-contrast · axial · 3.0mm · 0.39mm/px · z∈[-27,+100]mm · 6 of 40 slices shown]
[im 1/40]
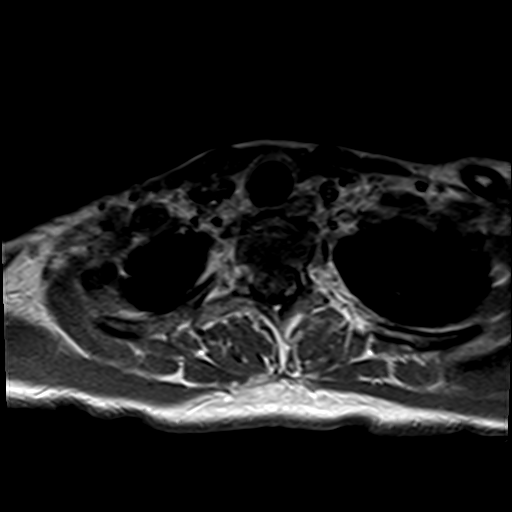
[im 8/40]
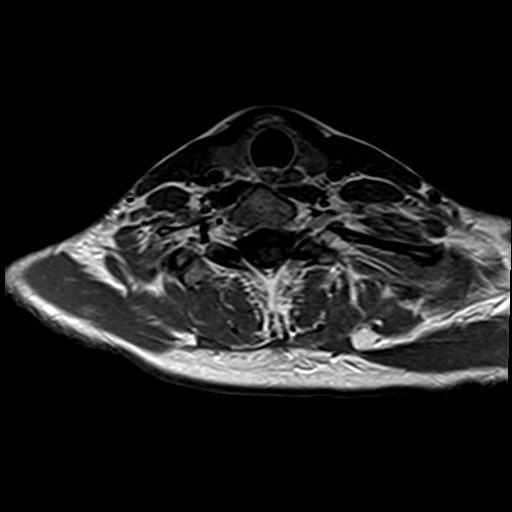
[im 16/40]
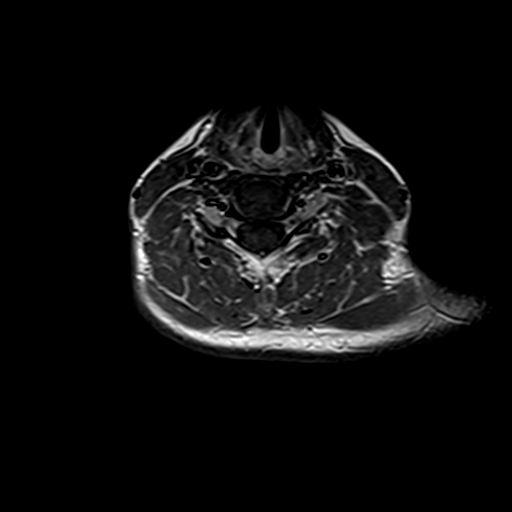
[im 24/40]
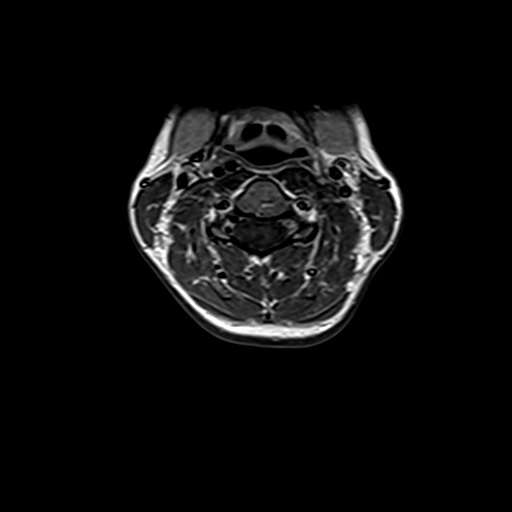
[im 32/40]
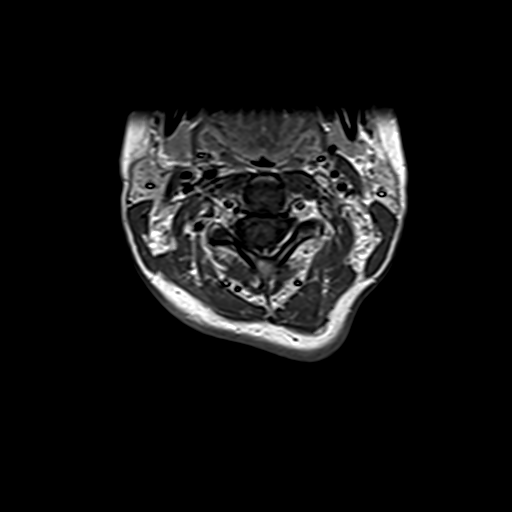
[im 40/40]
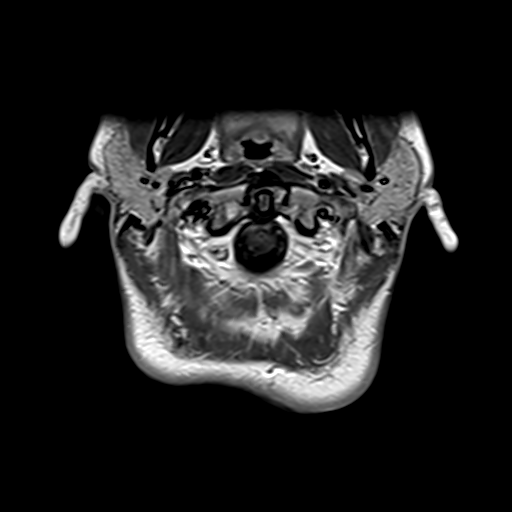

[26 of 48 positions shown; findings below may reference images not displayed]

FINDINGS: Alignment: Mild straightening and reversal of cervical lordosis. No
spondylolisthesis.

Vertebrae: No marrow edema or evidence of acute osseous abnormality.
Visualized bone marrow signal is within normal limits.

Cord: Normal cervical spinal cord. No abnormal cord signal or
enhancement identified. No abnormal intradural enhancement or dural
thickening.

Posterior Fossa, vertebral arteries, paraspinal tissues:
Cervicomedullary junction is within normal limits. Dedicated brain
MRI today reported separately.

Preserved major vascular flow voids in the neck. Vertebral arteries
appear codominant.
Visible neck soft tissues are within normal limits. Negative visible
lung apices.

Disc levels:

C2-C3:  Negative.

C3-C4:  Negative.

C4-C5: Mild disc desiccation. Broad-based mild posterior disc bulge
or protrusion (series 5, image 8). No significant stenosis.

C5-C6:  Minimal disc bulge.  No significant stenosis.

C6-C7:  Negative.

C7-T1:  Negative.
IMPRESSION: 1. Normal cervical spinal cord.
2. Very mild cervical spine degeneration at C4-C5 and C5-C6 with no
spinal stenosis or convincing neural impingement.

## 2020-06-05 MED ORDER — LORAZEPAM 2 MG/ML IJ SOLN
1.0000 mg | Freq: Once | INTRAMUSCULAR | Status: AC
  Administered 2020-06-05: 1 mg via INTRAVENOUS
  Filled 2020-06-05: qty 1

## 2020-06-05 MED ORDER — GADOBUTROL 1 MMOL/ML IV SOLN
6.5000 mL | Freq: Once | INTRAVENOUS | Status: AC | PRN
  Administered 2020-06-05: 6.5 mL via INTRAVENOUS

## 2020-06-05 NOTE — ED Notes (Addendum)
Reviewed discharge papers with pt, reviewed referral and need to follow up, reviewed education with pt, pt denies questions at this time, heart monitor removed during MRI, heart monitor placed in belonging bag pt aware and has bag in hand at time of discharge.

## 2020-06-05 NOTE — Discharge Instructions (Signed)
Patient was provided with psych outpatient follow-up resources.  Patient was advised to maintain physical activity as tolerated and stay properly hydrated.

## 2020-06-05 NOTE — Discharge Instructions (Signed)
If you develop severe headache, new or worsening weakness, trouble speaking, fever, vomiting, chest pain, or any other new/concerning symptoms then return to the ER for evaluation.

## 2020-06-05 NOTE — ED Provider Notes (Signed)
Signed out to Dr. Criss Alvine with MRI pending    Zadie Rhine, MD 06/05/20 [phone]

## 2020-06-05 NOTE — ED Provider Notes (Signed)
Care transferred to me.  Patient's MRI is are unremarkable and show no obvious cause for why she is having her paresthesias.  She does endorse some nonspecific chest pain for a couple days though she has had this worked up multiple times at other hospitals and I do not think troponin testing is needed as this sounds very atypical for ACS.  Highly doubt dissection or PE.  She appears stable for discharge home to follow-up with outpatient neurology.   EKG Interpretation  Date/Time:  Tuesday June 05 2020 07:21:36 EDT Ventricular Rate:  60 PR Interval:  156 QRS Duration: 76 QT Interval:  418 QTC Calculation: 418 R Axis:   73 Text Interpretation: Normal sinus rhythm no acute ST/T changes Confirmed by Pricilla Loveless [phone]) on 06/05/2020 8:09:51 AM         Pricilla Loveless, MD 06/05/20 [phone]
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
## 2020-06-05 NOTE — ED Provider Notes (Signed)
Behavioral Health Urgent Care Medical Screening Exam  Patient Name: Donna Leach MRN: [id number] Date of Evaluation: 06/06/20 Chief Complaint: Chief Complaint/Presenting Problem: Pt walk-in at [facility] to request psychiatric services. 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 Pt reports Dr. Evelene Croon was psychiatrist (3 years ago). Pt is not receiving counseling services. Pt reports that she has not had suicidal thoughts in 6 years. Pt denies any homicidal thoughts.  Pt denies experiencing any auditory or visual hallucinations at time of assessment. Pt reports that she feels severe health-related/somatic anxiety. Pt and sister have been at the ED x 2 today for different health-related concerns (discharged). Pt and sister report that pt is not a danger to herself or others, and they feel that outpatient psychiatric service referral would be good intervention.   Diagnosis:  Final diagnoses:  Generalized anxiety disorder    History of Present illness: Donna Leach is a 29 y.o. female with a past medical history significant for PTSD, OCD, depression and anxiety who  voluntarily present to Behavioral Health Urgent Care due to panic attacks. Patient states the panic attacks have been happening for a couple of days with no identifiable triggers. Patient states that she had covid 4 weeks ago and since then has been in and out of the hospital. Patient states that she was just in the ED yesterday and an MRI was performed on her for the assessment of paresthesias. Result for the MRI were unremarkable but the patient was encouraged to go to Neurologist for further assessment. Patient states that she is experiencing cramping pain in her left leg as well as pain her arms. After further questioning, the patient revealed that she was most worried about blood clots due to drinking less water, her birth control, and the panic attacks she has been  experiencing. Before being infected with covid, patient said she was able to exercise 5 times a week as a coping mechanism for her anxiety.  Patient denies suicide and homicide ideation. Patient further denies auditory or visual hallucinations. Patient states that she takes hydroxyzine. Patient endorses no appetite but states she has at least 2 meals a day. Patient sates that her sleep has not been good and is afraid to go to sleep. Patient states that she used to get 8 hours of sleep but has been getting  4 - 5 hours of sleep as of late. Patient states that she hasn't had psych provider in 3 years and is interested in being set up with a new one.  Psychiatric Specialty Exam  Presentation  General Appearance:Appropriate for Environment  Eye Contact:Good  Speech:Clear and Coherent;Normal Rate  Speech Volume:Normal  Handedness:Right   Mood and Affect  Mood:Anxious  Affect:Appropriate;Congruent   Thought Process  Thought Processes:Coherent;Goal Directed  Descriptions of Associations:Intact  Orientation:Full (Time, Place and Person)  Thought Content:Logical  Hallucinations:None  Ideas of Reference:None  Suicidal Thoughts:No  Homicidal Thoughts:No   Sensorium  Memory:Immediate Good;Recent Good;Remote Good  Judgment:Good  Insight:Good   Executive Functions  Concentration:Good  Attention Span:Good  Recall:Good  Fund of Knowledge:Good  Language:Good   Psychomotor Activity  Psychomotor Activity:Normal   Assets  Assets:Communication Skills;Desire for Improvement;Financial Resources/Insurance;Housing;Social Support   Sleep  Sleep:Poor  Number of hours: No data recorded   Physical Exam: Physical Exam Constitutional:      Appearance: She is ill-appearing.  HENT:     Head: Normocephalic and atraumatic.  Eyes:     Extraocular Movements: Extraocular movements  intact.     Pupils: Pupils are equal, round, and reactive to light.  Pulmonary:      Effort: Pulmonary effort is normal.     Breath sounds: Normal breath sounds.  Musculoskeletal:        General: Normal range of motion.     Cervical back: Normal range of motion and neck supple.  Skin:    General: Skin is warm and dry.  Neurological:     General: No focal deficit present.     Mental Status: She is alert and oriented to person, place, and time.  Psychiatric:        Behavior: Behavior normal.        Thought Content: Thought content normal.        Judgment: Judgment normal.    Review of Systems  Constitutional: Positive for malaise/fatigue.  HENT: Negative.   Eyes: Negative.   Respiratory: Negative.   Cardiovascular: Negative.   Gastrointestinal: Negative.   Musculoskeletal: Positive for myalgias (Patient endorses crampy pain in left len and pain in arms).  Skin: Negative.   Neurological:       Patient states her reflexes have been overreactive  Endo/Heme/Allergies: Negative.   Psychiatric/Behavioral: Negative for depression, hallucinations and suicidal ideas. The patient is nervous/anxious.    Blood pressure (!) 117/91, pulse (!) 102, temperature 97.8 F (36.6 C), temperature source Tympanic, resp. rate 18, height 5\' 8"  (1.727 m), weight 58.1 kg, SpO2 100 %. Body mass index is 19.46 kg/m.  Musculoskeletal: Strength & Muscle Tone: within normal limits Gait & Station: normal Patient leans: N/A   [facility] MSE Discharge Disposition for Follow up and Recommendations: Based on my evaluation the patient does not appear to have an emergency medical condition and can be discharged with resources and follow up care in outpatient services for Individual Therapy. Patient was provided with psych outpatient follow-up resources and was advised to maintain some level of physical activity as tolerated and stay properly hydrated.   , PA 06/06/2020, 3:52 AM

## 2020-06-06 ENCOUNTER — Telehealth: Payer: BC Managed Care – PPO | Admitting: Internal Medicine

## 2020-06-06 ENCOUNTER — Emergency Department (HOSPITAL_COMMUNITY)
Admission: EM | Admit: 2020-06-06 | Discharge: 2020-06-06 | Disposition: A | Discharge status: Home / Self Care | Payer: BC Managed Care – PPO | Attending: Emergency Medicine | Admitting: Emergency Medicine

## 2020-06-06 ENCOUNTER — Emergency Department (HOSPITAL_COMMUNITY): Payer: BC Managed Care – PPO

## 2020-06-06 ENCOUNTER — Encounter (HOSPITAL_COMMUNITY): Payer: Self-pay | Admitting: Emergency Medicine

## 2020-06-06 ENCOUNTER — Other Ambulatory Visit: Payer: Self-pay

## 2020-06-06 DIAGNOSIS — R350 Frequency of micturition: Secondary | ICD-10-CM

## 2020-06-06 DIAGNOSIS — E876 Hypokalemia: Secondary | ICD-10-CM

## 2020-06-06 DIAGNOSIS — R0789 Other chest pain: Secondary | ICD-10-CM

## 2020-06-06 DIAGNOSIS — R079 Chest pain, unspecified: Secondary | ICD-10-CM | POA: Diagnosis not present

## 2020-06-06 DIAGNOSIS — Z760 Encounter for issue of repeat prescription: Secondary | ICD-10-CM | POA: Diagnosis not present

## 2020-06-06 DIAGNOSIS — F411 Generalized anxiety disorder: Secondary | ICD-10-CM

## 2020-06-06 LAB — BASIC METABOLIC PANEL
Anion gap: 11 (ref 5–15)
BUN: 18 mg/dL (ref 6–20)
CO2: 23 mmol/L (ref 22–32)
Calcium: 9.4 mg/dL (ref 8.9–10.3)
Chloride: 105 mmol/L (ref 98–111)
Creatinine, Ser: 0.7 mg/dL (ref 0.44–1.00)
GFR calc Af Amer: >60 mL/min (ref >60)
GFR calc non Af Amer: >60 mL/min (ref >60)
Glucose, Bld: 90 mg/dL (ref 70–99)
Potassium: 3.4 mmol/L — ABNORMAL LOW (ref 3.5–5.1)
Sodium: 139 mmol/L (ref 135–145)

## 2020-06-06 LAB — D-DIMER, QUANTITATIVE: D-Dimer, Quant: <0.27 ug/mL-FEU (ref 0.00–0.50)

## 2020-06-06 LAB — URINALYSIS, ROUTINE W REFLEX MICROSCOPIC
Bilirubin Urine: NEGATIVE
Glucose, UA: NEGATIVE mg/dL
Ketones, ur: NEGATIVE mg/dL
Leukocytes,Ua: NEGATIVE
Nitrite: NEGATIVE
Protein, ur: NEGATIVE mg/dL
Specific Gravity, Urine: 1.006 (ref 1.005–1.030)
pH: 6 (ref 5.0–8.0)

## 2020-06-06 LAB — CBC
HCT: 36.8 % (ref 36.0–46.0)
Hemoglobin: 13.1 g/dL (ref 12.0–15.0)
MCH: 33.4 pg (ref 26.0–34.0)
MCHC: 35.6 g/dL (ref 30.0–36.0)
MCV: 93.9 fL (ref 80.0–100.0)
Platelets: 222 10*3/uL (ref 150–400)
RBC: 3.92 MIL/uL (ref 3.87–5.11)
RDW: 11.5 % (ref 11.5–15.5)
WBC: 7.7 10*3/uL (ref 4.0–10.5)
nRBC: 0 % (ref 0.0–0.2)

## 2020-06-06 LAB — LIPASE, BLOOD: Lipase: 39 U/L (ref 11–51)

## 2020-06-06 LAB — MAGNESIUM: Magnesium: 2.1 mg/dL (ref 1.7–2.4)

## 2020-06-06 LAB — I-STAT BETA HCG BLOOD, ED (NOT ORDERABLE): I-stat hCG, quantitative: <5 m[IU]/mL (ref <5)

## 2020-06-06 LAB — TROPONIN I (HIGH SENSITIVITY)
Troponin I (High Sensitivity): <2 ng/L (ref <18)
Troponin I (High Sensitivity): <2 ng/L (ref <18)

## 2020-06-06 IMAGING — CR DG CHEST 2V
2 series · 2 of 2 positions shown · non-contrast
Comparison: [DATE]

CLINICAL DATA: Mid chest pain for several weeks

EXAM:
CHEST - 2 VIEW

[w chest pa]
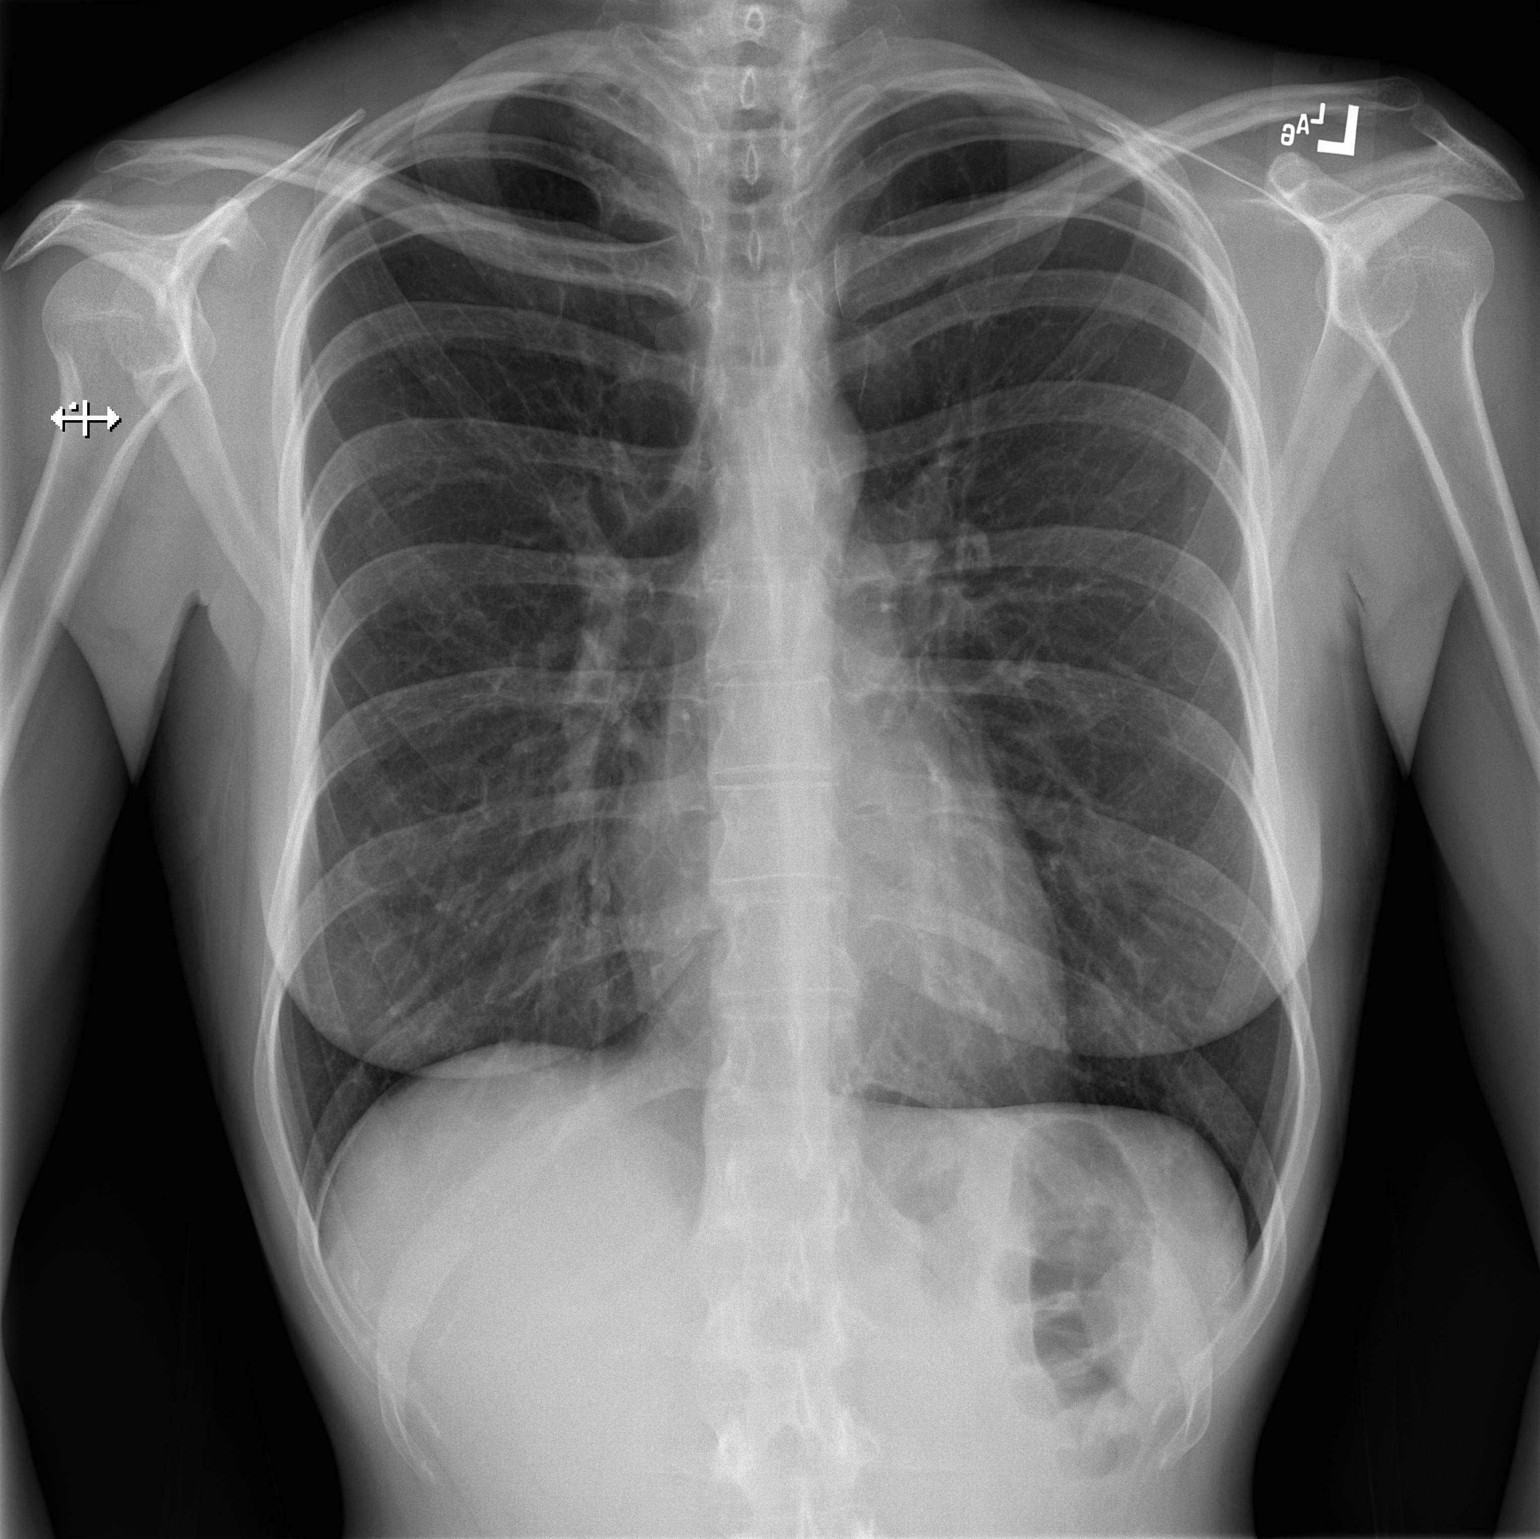

[w chest lat]
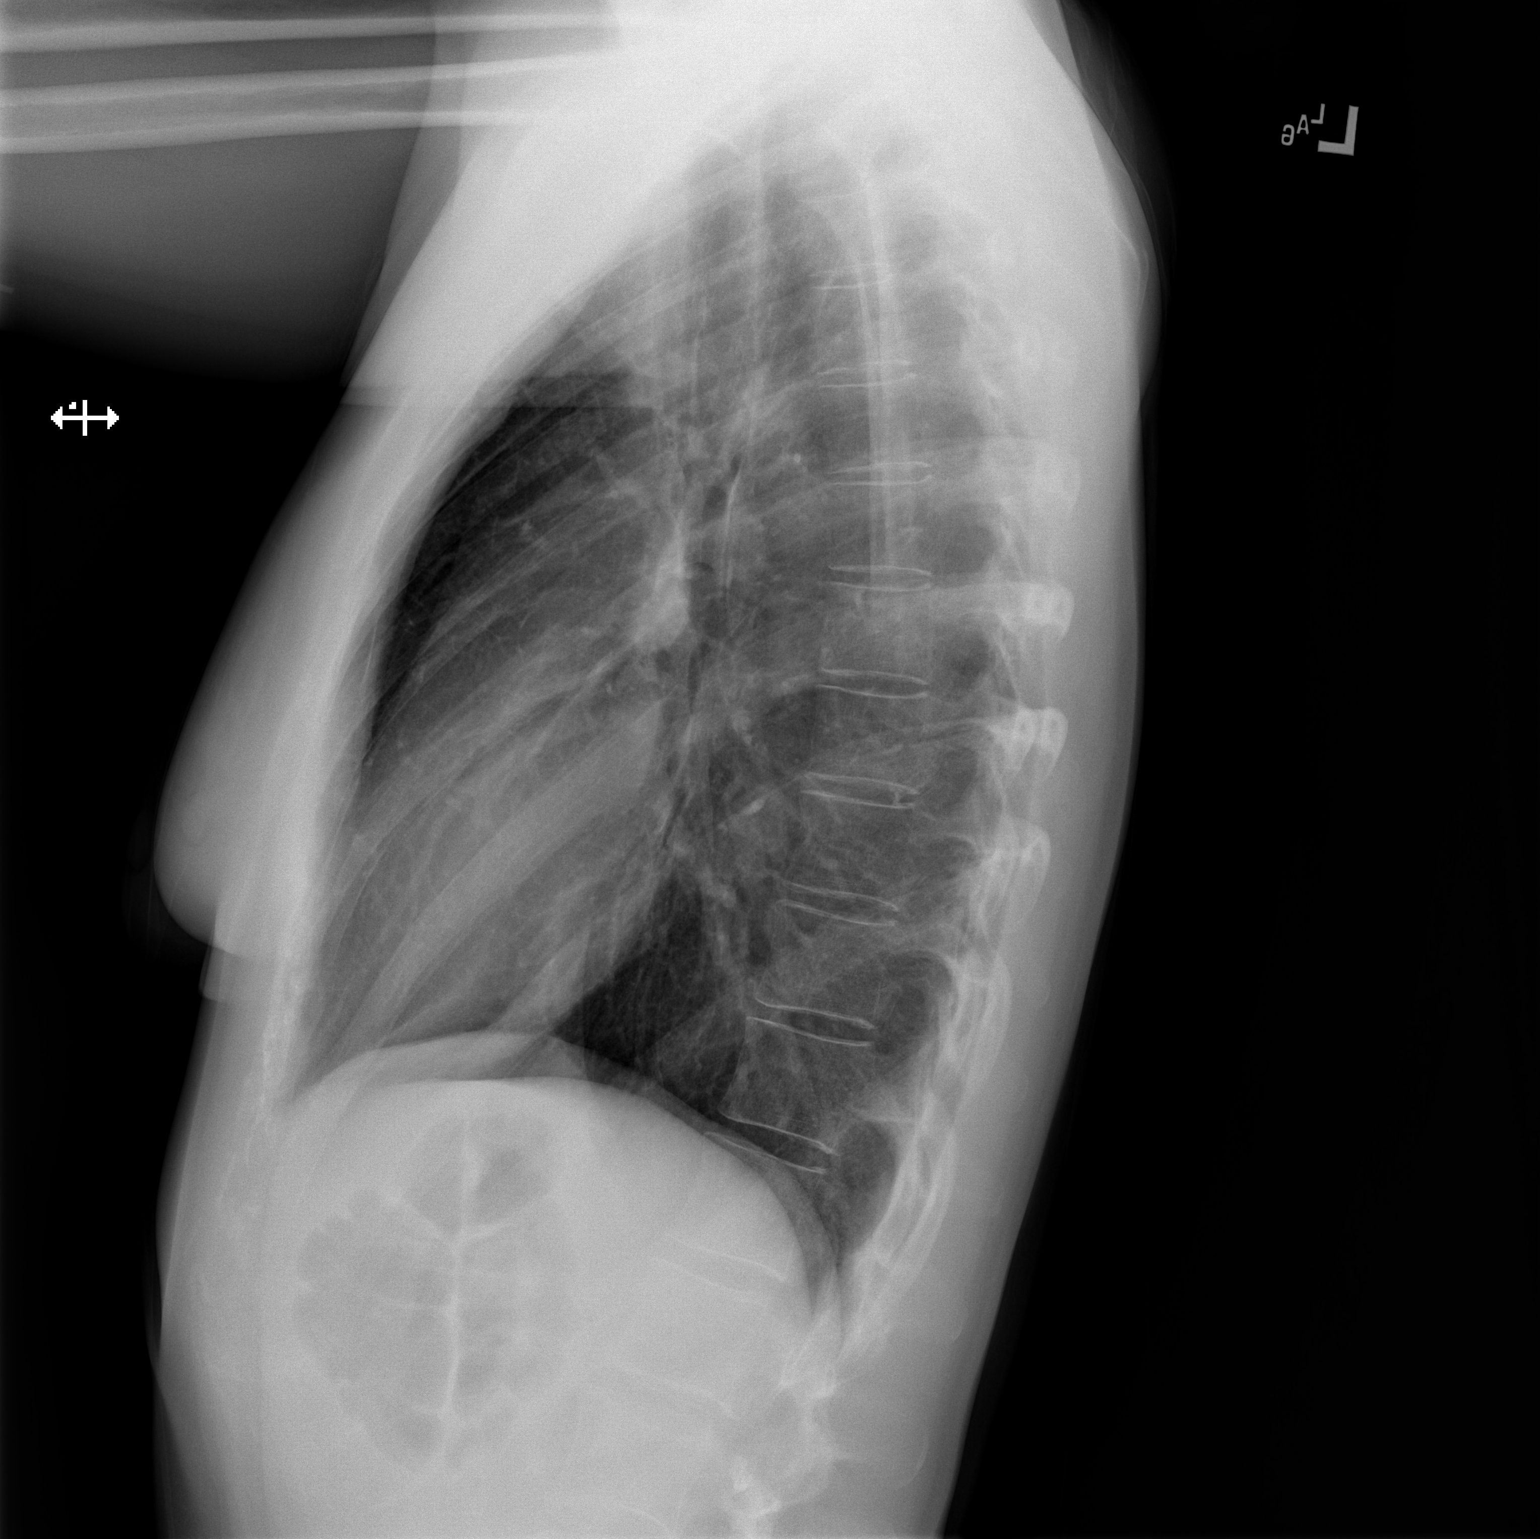

[2 of 2 positions shown; findings below may reference images not displayed]

FINDINGS: Mild hyperinflation. Heart size and pulmonary vascularity are
normal. Lungs are clear. No pleural effusions. No pneumothorax.
Mediastinal contours appear intact.
IMPRESSION: No active cardiopulmonary disease.

## 2020-06-06 MED ORDER — POTASSIUM CHLORIDE CRYS ER 20 MEQ PO TBCR: 20.0000 meq | EXTENDED_RELEASE_TABLET | Freq: Two times a day (BID) | ORAL | 0 refills | Status: AC

## 2020-06-06 MED ORDER — POTASSIUM CHLORIDE CRYS ER 20 MEQ PO TBCR
40.0000 meq | EXTENDED_RELEASE_TABLET | Freq: Once | ORAL | Status: AC
  Administered 2020-06-06: 40 meq via ORAL
  Filled 2020-06-06: qty 2

## 2020-06-06 NOTE — ED Provider Notes (Signed)
[facility]-EMERGENCY DEPT Provider Note   CSN: [id number] Arrival date & time: 06/05/20  2359   History Chief Complaint  Patient presents with   Chest Pain    Donna Leach is a 29 y.o. female.  The history is provided by the patient.  She has history of generalized anxiety disorder and had a recent episode of COVID-19 and comes in because of chest pain and palpitations.  She had COVID-19 about 1 month ago, and has been having palpitations since then.  She did see a cardiologist who put her on a heart monitor which was taken off yesterday so that she could have an MRI scan.  She is continuing to have palpitations and is having some pains across her chest.  She describes a sharp, fleeting pain.  She is extremely anxious because she knows that Covid 19 can lead to blood clots.  She also endorses no change in chronic paresthesias.  She has noted that she is urinating very frequently and cannot hold her urine.  She denies fever, chills, sweats.  She is not coughing.  She denies dyspnea.  She is a former smoker.  Of note, she had been seen at behavioral [facility] urgent care center and started on hydroxyzine for generalized anxiety disorder.  She has noticed that she gets some slight relief of her anxiety with hydroxyzine 25 mg.  Past Medical History:  Diagnosis Date   Depression    No pertinent past medical history     Patient Active Problem List   Diagnosis Date Noted   Major depressive disorder, recurrent episode, moderate (HCC)    Generalized anxiety disorder    Thyroiditis 11/04/2018   Traumatic hematoma of finger 11/06/2016   Depression 04/08/2016    Past Surgical History:  Procedure Laterality Date   Adnoids     APPENDECTOMY       OB History    Gravida  0   Para      Term      Preterm      AB      Living        SAB      TAB      Ectopic      Multiple      Live Births              Family History  Problem  Relation Age of Onset   Hypertension Father    Diabetes Father    Diabetes Mother    Hyperthyroidism Other     Social History   Tobacco Use   Smoking status: Former Smoker    Years: 2.00    Quit date: 11/14/2016    Years since quitting: 3.5   Smokeless tobacco: Never Used   Tobacco comment: no cigarettes in 3 months  Substance Use Topics   Alcohol use: Yes    Alcohol/week: 1.0 standard drink    Types: 1 Standard drinks or equivalent per week    Comment: socially   Drug use: No    Home Medications Prior to Admission medications   Not on File    Allergies    Penicillins, Amoxicillin, and Cefixime  Review of Systems   Review of Systems  All other systems reviewed and are negative.   Physical Exam Updated Vital Signs BP 112/84 (BP Location: Left Arm)    Pulse 77    Temp 97.8 F (36.6 C) (Oral)    Resp 14    Ht 5\' 8"  (1.727 m)  Wt 58.1 kg    SpO2 100%    BMI 19.46 kg/m   Physical Exam Vitals and nursing note reviewed.   29 year old female, appears very anxious, but is in no acute distress. Vital signs are normal. Oxygen saturation is 100%, which is normal. Head is normocephalic and atraumatic. PERRLA, EOMI. Oropharynx is clear. Neck is nontender and supple without adenopathy or JVD. Back is nontender and there is no CVA tenderness. Lungs are clear without rales, wheezes, or rhonchi. Chest has diffuse tenderness without crepitus. Heart has regular rate and rhythm without murmur. Abdomen is soft, flat, nontender without masses or hepatosplenomegaly and peristalsis is normoactive. Extremities have no cyanosis or edema, full range of motion is present. Skin is warm and dry without rash. Neurologic: Awake and alert, somewhat anxious, normal speech, cranial nerves are intact, there are no motor or sensory deficits.  ED Results / Procedures / Treatments   Labs (all labs ordered are listed, but only abnormal results are displayed) Labs Reviewed  BASIC  METABOLIC PANEL - Abnormal; Notable for the following components:      Result Value   Potassium 3.4 (*)    All other components within normal limits  URINALYSIS, ROUTINE W REFLEX MICROSCOPIC - Abnormal; Notable for the following components:   Color, Urine STRAW (*)    Hgb urine dipstick SMALL (*)    Bacteria, UA RARE (*)    All other components within normal limits  CBC  LIPASE, BLOOD  D-DIMER, QUANTITATIVE (NOT AT [facility])  MAGNESIUM  I-STAT BETA HCG BLOOD, ED ([facility], [facility], [facility] ONLY)  I-STAT BETA HCG BLOOD, ED (NOT ORDERABLE)  TROPONIN I (HIGH SENSITIVITY)  TROPONIN I (HIGH SENSITIVITY)    EKG ECG shows normal sinus rhythm with a rate of 75, no ectopy. Normal axis. Normal P wave. Normal QRS. Normal intervals. Normal ST and T waves. Impression: normal ECG.  Compared with ECG of 06/05/2020, no significant changes are seen.  Radiology DG Chest 2 View  Result Date: 06/06/2020 CLINICAL DATA:  Mid chest pain for several weeks EXAM: CHEST - 2 VIEW COMPARISON:  06/04/2020 FINDINGS: Mild hyperinflation. Heart size and pulmonary vascularity are normal. Lungs are clear. No pleural effusions. No pneumothorax. Mediastinal contours appear intact. IMPRESSION: No active cardiopulmonary disease. Electronically Signed   By: Burman Nieves M.D.   On: 06/06/2020 01:51   MR Brain W and Wo Contrast  Result Date: 06/05/2020 CLINICAL DATA:  29 year old female with multiple sclerosis. Positive for COVID-19 last month. Persistent chest pain. Palpitations. Recent upper and lower extremity pain. EXAM: MRI HEAD WITHOUT AND WITH CONTRAST TECHNIQUE: Multiplanar, multiecho pulse sequences of the brain and surrounding structures were obtained without and with intravenous contrast. CONTRAST:  6.71mL GADAVIST GADOBUTROL 1 MMOL/ML IV SOLN COMPARISON:  Report of head CT 08/13/1999 (no images available). FINDINGS: Brain: Normal cerebral volume. No restricted diffusion to suggest acute infarction. No midline shift, mass effect,  evidence of mass lesion, ventriculomegaly, extra-axial collection or acute intracranial hemorrhage. Cervicomedullary junction and pituitary are within normal limits. Wallace Cullens and white matter signal is within normal limits throughout the brain. Only a punctate solitary nonspecific focus of increased FLAIR hyperintensity is identified in the left cerebral white matter on series 18, image 74 no encephalomalacia or chronic cerebral blood products. Deep gray nuclei, brainstem and cerebellum are within normal limits. No abnormal enhancement identified.  No dural thickening. Vascular: Major intracranial vascular flow voids are preserved. The major dural venous sinuses are enhancing and appear to be patent. The right  transverse and sigmoid sinus appear dominant along with the right IJ bulb. Skull and upper cervical spine: Cervical spine detailed separately. Normal visible bone marrow signal. Sinuses/Orbits: Mild motion artifact of the orbits which appear grossly normal. Scattered mild paranasal sinus mucosal thickening, mostly on the left. No sinus fluid levels. Other: Mastoids are clear. Visible internal auditory structures appear normal. Scalp and face soft tissues appear negative. IMPRESSION: 1.  MRI appearance of the brain is within normal limits. 2. Mild paranasal sinus mucosal thickening, appears inconsequential. Electronically Signed   By: Odessa Fleming M.D.   On: 06/05/2020 07:14   MR Cervical Spine W or Wo Contrast  Result Date: 06/05/2020 CLINICAL DATA:  29 year old female with multiple sclerosis. Positive for COVID-19 last month. Persistent chest pain. Palpitations. Recent upper and lower extremity pain. EXAM: MRI CERVICAL SPINE WITHOUT AND WITH CONTRAST TECHNIQUE: Multiplanar and multiecho pulse sequences of the cervical spine, to include the craniocervical junction and cervicothoracic junction, were obtained without and with intravenous contrast. CONTRAST:  6.53mL GADAVIST GADOBUTROL 1 MMOL/ML IV SOLN COMPARISON:   Brain MRI today reported separately. FINDINGS: Alignment: Mild straightening and reversal of cervical lordosis. No spondylolisthesis. Vertebrae: No marrow edema or evidence of acute osseous abnormality. Visualized bone marrow signal is within normal limits. Cord: Normal cervical spinal cord. No abnormal cord signal or enhancement identified. No abnormal intradural enhancement or dural thickening. Posterior Fossa, vertebral arteries, paraspinal tissues: Cervicomedullary junction is within normal limits. Dedicated brain MRI today reported separately. Preserved major vascular flow voids in the neck. Vertebral arteries appear codominant. Visible neck soft tissues are within normal limits. Negative visible lung apices. Disc levels: C2-C3:  Negative. C3-C4:  Negative. C4-C5: Mild disc desiccation. Broad-based mild posterior disc bulge or protrusion (series 5, image 8). No significant stenosis. C5-C6:  Minimal disc bulge.  No significant stenosis. C6-C7:  Negative. C7-T1:  Negative. IMPRESSION: 1. Normal cervical spinal cord. 2. Very mild cervical spine degeneration at C4-C5 and C5-C6 with no spinal stenosis or convincing neural impingement. Electronically Signed   By: Odessa Fleming M.D.   On: 06/05/2020 07:19   MR THORACIC SPINE W WO CONTRAST  Result Date: 06/05/2020 CLINICAL DATA:  29 year old female with multiple sclerosis. Positive for COVID-19 last month. Persistent chest pain. Palpitations. Recent upper and lower extremity pain. EXAM: MRI THORACIC WITHOUT AND WITH CONTRAST TECHNIQUE: Multiplanar and multiecho pulse sequences of the thoracic spine were obtained without and with intravenous contrast. CONTRAST:  6.31mL GADAVIST GADOBUTROL 1 MMOL/ML IV SOLN COMPARISON:  Head and cervical spine MRI today reported separately. Chest CTA 05/26/2020. FINDINGS: Limited cervical spine imaging:  Reported separately today. Thoracic spine segmentation: Evidence of small C7 cervical ribs, and also hypoplastic ribs at T12. But  otherwise normal. Alignment: Minimal upper thoracic spine scoliosis. Normal thoracic kyphosis. Vertebrae: No marrow edema or evidence of acute osseous abnormality. Visualized bone marrow signal is within normal limits. Cord: Normal. The conus medullaris appears normal at T12-L1. Capacious thoracic spinal canal. No abnormal intradural enhancement. No dural thickening. Paraspinal and other soft tissues: Negative. Disc levels: T1-T2: Mild left foraminal disc bulge and/or endplate spurring. But no significant left T1 foraminal stenosis. All other thoracic levels are negative. IMPRESSION: 1. Normal thoracic spinal cord. 2. Minor upper thoracic scoliosis with minimal T1-T2 degeneration. Capacious spinal canal with no stenosis. Electronically Signed   By: Odessa Fleming M.D.   On: 06/05/2020 07:24   MR Lumbar Spine W Wo Contrast  Result Date: 06/05/2020 CLINICAL DATA:  29 year old female with multiple sclerosis. Positive for  COVID-19 last month. Persistent chest pain. Palpitations. Recent upper and lower extremity pain. EXAM: MRI LUMBAR SPINE WITHOUT AND WITH CONTRAST TECHNIQUE: Multiplanar and multiecho pulse sequences of the lumbar spine were obtained without and with intravenous contrast. CONTRAST:  6.27mL GADAVIST GADOBUTROL 1 MMOL/ML IV SOLN COMPARISON:  Thoracic MRI today reported separately. CT Abdomen and Pelvis 04/20/2008. FINDINGS: Segmentation: Normal, concordant with the thoracic spine numbering today. Hypoplastic ribs at T12. Alignment: Minor dextroconvex lumbar scoliosis with preserved lumbar lordosis. No spondylolisthesis. Vertebrae: No marrow edema or evidence of acute osseous abnormality. Visualized bone marrow signal is within normal limits. Intact visible sacrum and SI joints. Conus medullaris and cauda equina: Conus extends to the T12-L1 level. No lower spinal cord or conus signal abnormality. Capacious lumbar spinal canal. Normal cauda equina. No abnormal intradural enhancement. No dural thickening.  Paraspinal and other soft tissues: Negative. Disc levels: Visible lower thoracic spine through L2-L3 are negative. L3-L4: Mild disc desiccation. Minor disc bulging. Borderline to mild left L3 neural foraminal stenosis. L4-L5:  Mild facet hypertrophy.  Otherwise negative. L5-S1: Mild disc desiccation. Small broad-based disc protrusion toward the right lateral recess (series 12, image 32). But capacious canal, no convincing stenosis. IMPRESSION: 1. No acute or inflammatory process identified in the lumbar spine. 2. Capacious lumbar spinal canal with no spinal stenosis. A small L5-S1 disc protrusion is in proximity to the descending right S1 nerve roots in the lateral recess. Mild disc bulging at L3-L4 results in up to mild left L3 neural foraminal stenosis. Electronically Signed   By: Odessa Fleming M.D.   On: 06/05/2020 07:30    Procedures Procedures   Medications Ordered in ED Medications  potassium chloride SA (KLOR-CON) CR tablet 40 mEq (has no administration in time range)    ED Course  I have reviewed the triage vital signs and the nursing notes.  Pertinent labs & imaging results that were available during my care of the patient were reviewed by me and considered in my medical decision making (see chart for details).  MDM Rules/Calculators/A&P Chest pain which seems quite atypical.  Doubt ACS, doubt pulmonary embolism but will check D-dimer.  Chest x-ray shows no evidence of pneumonia.  Labs are significant for mild hypokalemia and she is given a dose of oral potassium.  Will check magnesium level as well.  I certainly feel that anxiety is a large part of her current complaints.  Urinary complaints sound like spastic bladder, will refer to urology.  She is also referred back to her cardiologist.  D-dimer and magnesium are normal.  She is discharged with above-noted instructions.  Final Clinical Impression(s) / ED Diagnoses Final diagnoses:  Atypical chest pain  Urinary frequency  Hypokalemia    Generalized anxiety disorder    Rx / DC Orders ED Discharge Orders    None       Dione Booze, MD 06/06/20 0700

## 2020-06-06 NOTE — Discharge Instructions (Addendum)
You may increase your hydroxyzine dose to 50 mg (two tablets) to see if it is more helpful at a higher dose.  Please follow up with your cardiologist.

## 2020-06-07 ENCOUNTER — Ambulatory Visit (HOSPITAL_COMMUNITY)
Admission: RE | Admit: 2020-06-07 | Discharge: 2020-06-07 | Disposition: A | Discharge status: Home / Self Care | Payer: BC Managed Care – PPO | Attending: Psychiatry | Admitting: Psychiatry

## 2020-06-07 ENCOUNTER — Other Ambulatory Visit: Payer: Self-pay

## 2020-06-07 ENCOUNTER — Emergency Department (HOSPITAL_COMMUNITY)
Admission: EM | Admit: 2020-06-07 | Discharge: 2020-06-07 | Disposition: A | Discharge status: Home / Self Care | Payer: BC Managed Care – PPO | Attending: Emergency Medicine | Admitting: Emergency Medicine

## 2020-06-07 ENCOUNTER — Encounter (HOSPITAL_COMMUNITY): Payer: Self-pay | Admitting: *Deleted

## 2020-06-07 DIAGNOSIS — R0789 Other chest pain: Secondary | ICD-10-CM | POA: Insufficient documentation

## 2020-06-07 DIAGNOSIS — R079 Chest pain, unspecified: Secondary | ICD-10-CM

## 2020-06-07 DIAGNOSIS — Z5321 Procedure and treatment not carried out due to patient leaving prior to being seen by health care provider: Secondary | ICD-10-CM | POA: Insufficient documentation

## 2020-06-07 DIAGNOSIS — F419 Anxiety disorder, unspecified: Secondary | ICD-10-CM | POA: Diagnosis not present

## 2020-06-07 DIAGNOSIS — Z87891 Personal history of nicotine dependence: Secondary | ICD-10-CM | POA: Diagnosis not present

## 2020-06-07 DIAGNOSIS — F321 Major depressive disorder, single episode, moderate: Secondary | ICD-10-CM | POA: Diagnosis not present

## 2020-06-07 DIAGNOSIS — F411 Generalized anxiety disorder: Secondary | ICD-10-CM | POA: Diagnosis present

## 2020-06-07 DIAGNOSIS — U071 COVID-19: Secondary | ICD-10-CM | POA: Diagnosis not present

## 2020-06-07 LAB — CBC
HCT: 33.8 % — ABNORMAL LOW (ref 36.0–46.0)
Hemoglobin: 12.6 g/dL (ref 12.0–15.0)
MCH: 33.7 pg (ref 26.0–34.0)
MCHC: 37.3 g/dL — ABNORMAL HIGH (ref 30.0–36.0)
MCV: 90.4 fL (ref 80.0–100.0)
Platelets: 236 10*3/uL (ref 150–400)
RBC: 3.74 MIL/uL — ABNORMAL LOW (ref 3.87–5.11)
RDW: 11.5 % (ref 11.5–15.5)
WBC: 6.7 10*3/uL (ref 4.0–10.5)
nRBC: 0 % (ref 0.0–0.2)

## 2020-06-07 LAB — COMPREHENSIVE METABOLIC PANEL
ALT: 11 U/L (ref 0–44)
AST: 14 U/L — ABNORMAL LOW (ref 15–41)
Albumin: 4.5 g/dL (ref 3.5–5.0)
Alkaline Phosphatase: 31 U/L — ABNORMAL LOW (ref 38–126)
Anion gap: 9 (ref 5–15)
BUN: 10 mg/dL (ref 6–20)
CO2: 26 mmol/L (ref 22–32)
Calcium: 9.4 mg/dL (ref 8.9–10.3)
Chloride: 104 mmol/L (ref 98–111)
Creatinine, Ser: 0.66 mg/dL (ref 0.44–1.00)
GFR calc Af Amer: >60 mL/min (ref >60)
GFR calc non Af Amer: >60 mL/min (ref >60)
Glucose, Bld: 96 mg/dL (ref 70–99)
Potassium: 3.5 mmol/L (ref 3.5–5.1)
Sodium: 139 mmol/L (ref 135–145)
Total Bilirubin: 1.1 mg/dL (ref 0.3–1.2)
Total Protein: 7.1 g/dL (ref 6.5–8.1)

## 2020-06-07 LAB — TROPONIN I (HIGH SENSITIVITY): Troponin I (High Sensitivity): <2 ng/L (ref <18)

## 2020-06-07 LAB — ETHANOL: Alcohol, Ethyl (B): <10 mg/dL (ref <10)

## 2020-06-07 MED ORDER — HYDROXYZINE HCL 25 MG PO TABS: 25.0000 mg | ORAL_TABLET | Freq: Four times a day (QID) | ORAL | 0 refills | Status: AC | PRN

## 2020-06-07 MED ORDER — MELATONIN 10 MG PO TABS: 1.0000 | ORAL_TABLET | Freq: Every day | ORAL | 0 refills | Status: AC

## 2020-06-07 MED ORDER — SUCRALFATE 1 G PO TABS: 1.0000 g | ORAL_TABLET | Freq: Three times a day (TID) | ORAL | 0 refills | Status: AC

## 2020-06-07 NOTE — Discharge Instructions (Addendum)
Please take hydroxyzine as prescribed.  I have prescribed you some additional tablets to use as needed.  You may take it every 6-8 hours for anxiety as needed.  The next 2 days I recommend taking it every 6-8 hours. I am also prescribing you melatonin 10 mg to use nightly approximately 1 hour before bedtime.  Drink plenty of water please refrain from using social media/TV/computer prior to bedtime as this can damage your ability to sleep quickly.  Please follow-up with your psychiatrist.  I have also prescribed you

## 2020-06-07 NOTE — Consult Note (Addendum)
Psych Consultation (MSE)  Reason for Consult:  [facility] walk in assessment  Referring Physician:   Location of Patient: [facility]-ASSESSMENT SERVICE Location of Provider: Other: [facility]  Patient Identification: Donna Leach MRN:  [id number] Principal Diagnosis: Generalized anxiety disorder Diagnosis:  Principal Problem:   Generalized anxiety disorder   Total Time spent with patient: 30 minutes  Subjective:   Donna Leach is a 29 y.o. female patient.  HPI:  Patient states "I think I need some fast acting meds until I can see a psychiatrist." Patient reports "The only thing that I have ever taken that has helped my anxiety is klonopin, it is the only medication that has helped my symptoms."  Patient states "The thing that keeps my anxiety at bay is exercise but I haven't been able to do that after being sick and being in the hospital I feel ike I can't exercise."   Patient reports she was seen in the emergency department and prescribed hydroxyzine at that time approximately 3 days ago.  Patient reports she does not feel the hydroxyzine is adequately effective to treat her anxiety.  Patient assessed by nurse practitioner. Patient alert and oriented, answers appropriately. Patient pleasant and cooperative during assessment.   Patient reports she was diagnosed with COVID earlier in 2021.  Patient reports experiencing anxiety times several months.  Patient reports she was told she had an irregular heartbeat and she was hospitalized for Covid and this has increased her anxiety.  Patient reports she was wearing a monitor that she returned today.  Patient reports she has an appointment to see primary care provider on tomorrow and she plans to discuss her anxiety and need for "fast acting medications" at that time.  Patient denies suicidal ideations.  Patient denies history of suicide attempts, denies self-harm behaviors.  Patient denies homicidal ideations.  Patient denies auditory and visual  hallucinations.  There is no evidence of delusional thought content and patient does not appear to be responding to internal stimuli.  Patient denies symptoms of paranoia.  Patient reports she currently resides in [place] with her sister, Maralyn Sago and her sister's ex-boyfriend.  Patient denies access to weapons.  Patient reports she is currently not employed.  Patient denies alcohol and substance use.  Patient reports she has recently used "a small amount of Xanax" that she purchased off the street.    Patient gives verbal consent to speak with sister, Maralyn Sago. Patient's sister verbalizes "I know someone that I can get xanax from off the street right now and this is why people do that." Patient's sister demands that patient be prescribed "something for anxiety, this is shitty."  Patient sister denies any suicidal or homicidal ideations to her knowledge.  Patient sister denies any hallucinations or paranoia.  Past Psychiatric History: Generalized anxiety disorder, major depressive disorder, recurrent  Risk to Self:   Denies Risk to Others:   Denies Prior Inpatient Therapy:   Denies Prior Outpatient Therapy:   last seen by outpatient approx. 3 years ago  Past Medical History:  Past Medical History:  Diagnosis Date  . Depression   . No pertinent past medical history     Past Surgical History:  Procedure Laterality Date  . Adnoids    . APPENDECTOMY     Family History:  Family History  Problem Relation Age of Onset  . Hypertension Father   . Diabetes Father   . Diabetes Mother   . Hyperthyroidism Other    Family Psychiatric  History: None reported Social History:  Social History   Substance and Sexual Activity  Alcohol Use Yes  . Alcohol/week: 1.0 standard drink  . Types: 1 Standard drinks or equivalent per week   Comment: socially     Social History   Substance and Sexual Activity  Drug Use No    Social History   Socioeconomic History  . Marital status: Single    Spouse  name: Not on file  . Number of children: Not on file  . Years of education: Not on file  . Highest education level: Not on file  Occupational History  . Not on file  Tobacco Use  . Smoking status: Former Smoker    Years: 2.00    Quit date: 11/14/2016    Years since quitting: 3.5  . Smokeless tobacco: Never Used  . Tobacco comment: no cigarettes in 3 months  Substance and Sexual Activity  . Alcohol use: Yes    Alcohol/week: 1.0 standard drink    Types: 1 Standard drinks or equivalent per week    Comment: socially  . Drug use: No  . Sexual activity: Yes    Birth control/protection: Implant  Other Topics Concern  . Not on file  Social History Narrative  . Not on file   Social Determinants of Health   Financial Resource Strain:   . Difficulty of Paying Living Expenses: Not on file  Food Insecurity:   . Worried About Programme researcher, broadcasting/film/video in the Last Year: Not on file  . Ran Out of Food in the Last Year: Not on file  Transportation Needs:   . Lack of Transportation (Medical): Not on file  . Lack of Transportation (Non-Medical): Not on file  Physical Activity:   . Days of Exercise per Week: Not on file  . Minutes of Exercise per Session: Not on file  Stress:   . Feeling of Stress : Not on file  Social Connections:   . Frequency of Communication with Friends and Family: Not on file  . Frequency of Social Gatherings with Friends and Family: Not on file  . Attends Religious Services: Not on file  . Active Member of Clubs or Organizations: Not on file  . Attends Banker Meetings: Not on file  . Marital Status: Not on file   Additional Social History:    Allergies:   Allergies  Allergen Reactions  . Penicillins Hives    Has patient had a PCN reaction causing immediate rash, facial/tongue/throat swelling, SOB or lightheadedness with hypotension: Y Has patient had a PCN reaction causing severe rash involving mucus membranes or skin necrosis: Y Has patient had a  PCN reaction that required hospitalization: N Has patient had a PCN reaction occurring within the last 10 years: N If all of the above answers are "NO", then may proceed with Cephalosporin use.  Has patient had a PCN reaction causing immediate rash, facial/tongue/throat swelling, SOB or lightheadedness with hypotension: Y Has patient had a PCN reaction causing severe rash involving mucus membranes or skin necrosis: Y Has patient had a PCN reaction that required hospitalization: N Has patient had a PCN reaction occurring within the last 10 years: N If all of the above answers are "NO", then may proceed with Cephalosporin use. Has patient had a PCN reaction causing immediate rash, facial/tongue/throat swelling, SOB or lightheadedness with hypotension: Y Has patient had a PCN reaction causing severe rash involving mucus membranes or skin necrosis: Y Has patient had a PCN reaction that required hospitalization: N Has patient had a  PCN reaction occurring within the last 10 years: N If all of the above answers are "NO", then may proceed with Cephalosporin use. Has patient had a PCN reaction causing immediate rash, facial/tongue/throat swelling, SOB or lightheadedness with hypotension: Y Has patient had a PCN reaction causing severe rash involving mucus membranes or skin necrosis: Y Has patient had a PCN reaction that required hospitalization: N Has patient had a PCN reaction occurring within the last 10 years: N If all of the above answers are "NO", then may proceed with Cephalosporin use. Has patient had a PCN reaction causing immediate rash, facial/tongue/throat swelling, SOB or lightheadedness with hypotension: Y Has patient had a PCN reaction causing severe rash involving mucus membranes or skin necrosis: Y Has patient had a PCN reaction that required hospitalization: N Has patient had a PCN reaction occurring within the last 10 years: N If all of the above answers are "NO", then may proceed  with Cephalosporin use. Has patient had a PCN reaction causing immediate rash, facial/tongue/throat swelling, SOB or lightheadedness with hypotension: Y Has patient had a PCN reaction causing severe rash involving mucus membranes or skin necrosis: Y Has patient had a PCN reaction that required hospitalization: N Has patient had a PCN reaction occurring within the last 10 years: N If all of the above answers are "NO", then may proceed with Cephalosporin use. Has patient had a PCN reaction causing immediate rash, facial/tongue/throat swelling, SOB or lightheadedness with hypotension: Y Has patient had a PCN reaction causing severe rash involvi... (TRUNCATED) Has patient had a PCN reaction causing immediate rash, facial/tongue/throat swelling, SOB or lightheadedness with hypotension: Y Has patient had a PCN reaction causing severe rash involving mucus membranes or skin necrosis: Y Has patient had a PCN reaction that required hospitalization: N Has patient had a PCN reaction occurring within the last 10 years: N If all of the above answers are "NO", then may proceed with Cephalosporin use. Has patient had a PCN reaction causing immediate rash, facial/tongue/throat swelling, SOB or lightheadedness with hypotension: Y Has patient had a PCN reaction causing severe rash involving mucus membranes or skin necrosis: Y Has patient had a PCN reaction that required hospitalization: N Has patient had a PCN reaction occurring within the last 10 years: N If all of the above answers are "NO", then may proceed with Cephalosporin use. Has patient had a PCN reaction causing immediate rash, facial/tongue/throat swelling, SOB or lightheadedness with hypotension: Y Has patient had a PCN reaction causing severe rash involving mucus membranes or skin necrosis: Y Has patient had a PCN reaction that required hospitalization: N Has patient had a PCN reaction occurring within the last 10 years: N If all of the above answers  are "NO", then may proceed with Cephalosporin use. Has patient had a PCN reaction causing immediate rash, facial/tongue/throat swelling, SOB or lightheadedness with hypotension: Y Has patient had a PCN reaction causing severe rash involvi... (TRUNCATED)   . Amoxicillin Hives  . Cefixime Hives    Labs:  Results for orders placed or performed during the hospital encounter of 06/06/20 (from the past 48 hour(s))  Basic metabolic panel     Status: Abnormal   Collection Time: 06/06/20  2:28 AM  Result Value Ref Range   Sodium 139 135 - 145 mmol/L   Potassium 3.4 (L) 3.5 - 5.1 mmol/L   Chloride 105 98 - 111 mmol/L   CO2 23 22 - 32 mmol/L   Glucose, Bld 90 70 - 99 mg/dL  Comment: Glucose reference range applies only to samples taken after fasting for at least 8 hours.   BUN 18 6 - 20 mg/dL   Creatinine, Ser 1.61 0.44 - 1.00 mg/dL   Calcium 9.4 8.9 - 09.6 mg/dL   GFR calc non Af Amer >60 >60 mL/min   GFR calc Af Amer >60 >60 mL/min   Anion gap 11 5 - 15    Comment: Performed at [facility], [address] [place], [place] [address]  CBC     Status: None   Collection Time: 06/06/20  2:28 AM  Result Value Ref Range   WBC 7.7 4.0 - 10.5 K/uL   RBC 3.92 3.87 - 5.11 MIL/uL   Hemoglobin 13.1 12.0 - 15.0 g/dL   HCT 98.1 36 - 46 %   MCV 93.9 80.0 - 100.0 fL   MCH 33.4 26.0 - 34.0 pg   MCHC 35.6 30.0 - 36.0 g/dL   RDW 19.1 47.8 - 29.5 %   Platelets 222 150 - 400 K/uL   nRBC 0.0 0.0 - 0.2 %    Comment: Performed at [facility], [address] [place], [place] [address]  Troponin I (High Sensitivity)     Status: None   Collection Time: 06/06/20  2:28 AM  Result Value Ref Range   Troponin I (High Sensitivity) <2 <18 ng/L    Comment: (NOTE) Elevated high sensitivity troponin I (hsTnI) values and significant  changes across serial measurements may suggest ACS but many other  chronic and acute conditions are known to elevate hsTnI results.  Refer to  the "Links" section for chest pain algorithms and additional  guidance. Performed at [facility], [address], [place], [place] [address]   Urinalysis, Routine w reflex microscopic Urine, Clean Catch     Status: Abnormal   Collection Time: 06/06/20  2:28 AM  Result Value Ref Range   Color, Urine STRAW (A) YELLOW   APPearance CLEAR CLEAR   Specific Gravity, Urine 1.006 1.005 - 1.030   pH 6.0 5.0 - 8.0   Glucose, UA NEGATIVE NEGATIVE mg/dL   Hgb urine dipstick SMALL (A) NEGATIVE   Bilirubin Urine NEGATIVE NEGATIVE   Ketones, ur NEGATIVE NEGATIVE mg/dL   Protein, ur NEGATIVE NEGATIVE mg/dL   Nitrite NEGATIVE NEGATIVE   Leukocytes,Ua NEGATIVE NEGATIVE   RBC / HPF 0-5 0 - 5 RBC/hpf   WBC, UA 0-5 0 - 5 WBC/hpf   Bacteria, UA RARE (A) NONE SEEN   Squamous Epithelial / LPF 0-5 0 - 5   Mucus PRESENT     Comment: Performed at [facility], [address] [place], [place] [address]  Lipase, blood     Status: None   Collection Time: 06/06/20  2:28 AM  Result Value Ref Range   Lipase 39 11 - 51 U/L    Comment: Performed at [facility], [address] [place], [place] [address]  I-Stat beta hCG blood, ED     Status: None   Collection Time: 06/06/20  2:41 AM  Result Value Ref Range   I-stat hCG, quantitative <5.0 <5 mIU/mL   Comment 3            Comment:   GEST. AGE      CONC.  (mIU/mL)   <=1 WEEK        5 - 50     2 WEEKS       50 - 500  3 WEEKS       100 - 10,000     4 WEEKS     1,000 - 30,000        FEMALE AND NON-PREGNANT FEMALE:     LESS THAN 5 mIU/mL   Troponin I (High Sensitivity)     Status: None   Collection Time: 06/06/20  5:53 AM  Result Value Ref Range   Troponin I (High Sensitivity) <2 <18 ng/L    Comment: (NOTE) Elevated high sensitivity troponin I (hsTnI) values and significant  changes across serial measurements may suggest ACS but many other  chronic and acute conditions are known to elevate hsTnI  results.  Refer to the "Links" section for chest pain algorithms and additional  guidance. Performed at [facility], [address] [place], [place] [address]   D-dimer, quantitative     Status: None   Collection Time: 06/06/20  5:53 AM  Result Value Ref Range   D-Dimer, Quant <0.27 0.00 - 0.50 ug/mL-FEU    Comment: (NOTE) At the manufacturer cut-off of 0.50 ug/mL FEU, this assay has been documented to exclude PE with a sensitivity and negative predictive value of 97 to 99%.  At this time, this assay has not been approved by the FDA to exclude DVT/VTE. Results should be correlated with clinical presentation. Performed at [facility], [address] [place], [place] [address]   Magnesium     Status: None   Collection Time: 06/06/20  5:53 AM  Result Value Ref Range   Magnesium 2.1 1.7 - 2.4 mg/dL    Comment: Performed at [facility], [address] [place], [place] [address]    Medications:  Current Outpatient Medications  Medication Sig Dispense Refill  . hydrOXYzine (ATARAX/VISTARIL) 25 MG tablet Take 1 tablet (25 mg total) by mouth every 6 (six) hours as needed for up to 14 days for anxiety. 56 tablet 0  . Melatonin 10 MG TABS Take 1 tablet by mouth at bedtime for 14 days. 14 tablet 0  . potassium chloride SA (KLOR-CON) 20 MEQ tablet Take 1 tablet (20 mEq total) by mouth 2 (two) times daily. 20 tablet 0  . sucralfate (CARAFATE) 1 g tablet Take 1 tablet (1 g total) by mouth 4 (four) times daily -  with meals and at bedtime for 14 days. 56 tablet 0   No current facility-administered medications for this encounter.    Musculoskeletal: Strength & Muscle Tone: within normal limits Gait & Station: normal Patient leans: N/A  Psychiatric Specialty Exam: Physical Exam Vitals and nursing note reviewed.  Constitutional:      Appearance: She is well-developed.  HENT:     Head: Normocephalic.  Cardiovascular:      Rate and Rhythm: Normal rate.  Pulmonary:     Effort: Pulmonary effort is normal.  Neurological:     Mental Status: She is alert and oriented to person, place, and time.  Psychiatric:        Attention and Perception: Attention and perception normal.        Mood and Affect: Affect normal. Mood is anxious.        Speech: Speech normal.        Behavior: Behavior normal. Behavior is cooperative.        Thought Content: Thought content normal.        Cognition and Memory: Cognition and memory normal.        Judgment: Judgment normal.     Review  of Systems  Constitutional: Negative.   HENT: Negative.   Eyes: Negative.   Respiratory: Negative.   Cardiovascular: Negative.   Gastrointestinal: Negative.   Genitourinary: Negative.   Musculoskeletal: Negative.   Skin: Negative.   Neurological: Negative.   Psychiatric/Behavioral: The patient is nervous/anxious.     There were no vitals taken for this visit.There is no height or weight on file to calculate BMI.  General Appearance: Casual and Fairly Groomed  Eye Contact:  Good  Speech:  Clear and Coherent and Normal Rate  Volume:  Normal  Mood:  Anxious  Affect:  Appropriate and Congruent  Thought Process:  Coherent, Goal Directed and Descriptions of Associations: Intact  Orientation:  Full (Time, Place, and Person)  Thought Content:  WDL and Logical  Suicidal Thoughts:  No  Homicidal Thoughts:  No  Memory:  Immediate;   Good Recent;   Good Remote;   Good  Judgement:  Fair  Insight:  Fair  Psychomotor Activity:  Normal  Concentration:  Concentration: Fair and Attention Span: Fair  Recall:  [employer] of Knowledge:  Fair  Language:  Fair  Akathisia:  No  Handed:  Right  AIMS (if indicated):     Assets:  Communication Skills Desire for Improvement Financial Resources/Insurance Housing Intimacy Leisure Time Physical Health Resilience Social Support  ADL's:  Intact  Cognition:  WNL  Sleep:      The patient demonstrates  the following risk factors for suicide:  Chronic risk factors for suicide include: N/A.  Acute risk factors for suicide include: N/A.  Protective factors for this patient include: positive social support, positive therapeutic relationship, coping skills and hope for the future.  Considering these factors, the overall suicide risk at this point appears to be low. Patient is appropriate for outpatient follow up.    Treatment Plan Summary: Plan follow up with outpatient psychiatry and talk therapy.    Patient reviewed with Dr. Nelly Rout.  Based on my evaluation the patient does not appear to have an emergency medical condition and can be discharged with the resources to follow-up care and outpatient services for medication management and individual therapy.  Disposition: No evidence of imminent risk to self or others at present.   Patient does not meet criteria for psychiatric inpatient admission. Supportive therapy provided about ongoing stressors.  This service was provided via telemedicine using a 2-way, interactive audio and video technology.  Names of all persons participating in this telemedicine service and their role in this encounter. Name: Thomasenia Sales Nickles Role: Patient  Name: Maralyn Sago Role: Patient sister  Name: Berneice Heinrich Role: FNP  Name: Dr. Lucianne Muss Role: Psychiatrist    Patrcia Dolly, FNP 06/07/2020 7:32 PM

## 2020-06-07 NOTE — ED Provider Notes (Signed)
[facility] Provider Note   CSN: [id number] Arrival date & time: 06/07/20  0507     History Chief Complaint  Patient presents with  . Chest Pain    Donna Leach is a 29 y.o. female.  HPI Patient is a 29 year old female with a history of depression anxiety not currently on any medications for this.  She has seen psychiatry in the past many years ago but has not seen them since.  Notably patient has been seen numerous times for similar symptoms over the past 12 days by my count she was checked into the emergency department 8 times although she left without being seen several of these.  Most recently she was seen yesterday and had thorough work-up including chest pain rule out labs, EKG, dimer and troponins.  She tells me that she has had chest tightness for over 1 month ever since she was diagnosed with COVID-19.  She states that her Covid-like symptoms seem to have significantly improved and she is not feeling short of breath or coughing anymore however she states that when she feels a tightness in her chest she panics and gets really anxious and she states the only thing that has helped her is Xanax.  She states that she is unable to put into words what the pain feels like but denies crushing chest pain and also denies pleuritic sharp chest pains.  She denies any exacerbating or mitigating factors.  She denies any associated symptoms apart from feeling anxious.  No nausea or vomiting.  No abdominal pain.  No lightheadedness or dizziness.  HPI: A 29 year old patient presents for evaluation of chest pain. Initial onset of pain was more than 6 hours ago. The patient's chest pain is not worse with exertion. The patient's chest pain is middle- or left-sided, is not well-localized, is not described as heaviness/pressure/tightness, is not sharp and does not radiate to the arms/jaw/neck. The patient does not complain of nausea and denies diaphoresis. The  patient has no history of stroke, has no history of peripheral artery disease, has not smoked in the past 90 days, denies any history of treated diabetes, has no relevant family history of coronary artery disease (first degree relative at less than age 30), is not hypertensive, has no history of hypercholesterolemia and does not have an elevated BMI (>=30).   Past Medical History:  Diagnosis Date  . Depression   . No pertinent past medical history     Patient Active Problem List   Diagnosis Date Noted  . Major depressive disorder, recurrent episode, moderate (HCC)   . Generalized anxiety disorder   . Thyroiditis 11/04/2018  . Traumatic hematoma of finger 11/06/2016  . Depression 04/08/2016    Past Surgical History:  Procedure Laterality Date  . Adnoids    . APPENDECTOMY       OB History    Gravida  0   Para      Term      Preterm      AB      Living        SAB      TAB      Ectopic      Multiple      Live Births              Family History  Problem Relation Age of Onset  . Hypertension Father   . Diabetes Father   . Diabetes Mother   . Hyperthyroidism Other  Social History   Tobacco Use  . Smoking status: Former Smoker    Years: 2.00    Quit date: 11/14/2016    Years since quitting: 3.5  . Smokeless tobacco: Never Used  . Tobacco comment: no cigarettes in 3 months  Substance Use Topics  . Alcohol use: Yes    Alcohol/week: 1.0 standard drink    Types: 1 Standard drinks or equivalent per week    Comment: socially  . Drug use: No    Home Medications Prior to Admission medications   Medication Sig Start Date End Date Taking? Authorizing Provider  hydrOXYzine (ATARAX/VISTARIL) 25 MG tablet Take 1 tablet (25 mg total) by mouth every 6 (six) hours as needed for up to 14 days for anxiety. 06/07/20 06/21/20  Gailen Shelter, PA  Melatonin 10 MG TABS Take 1 tablet by mouth at bedtime for 14 days. 06/07/20 06/21/20  Gailen Shelter, PA    potassium chloride SA (KLOR-CON) 20 MEQ tablet Take 1 tablet (20 mEq total) by mouth 2 (two) times daily. 06/06/20   Dione Booze, MD  sucralfate (CARAFATE) 1 g tablet Take 1 tablet (1 g total) by mouth 4 (four) times daily -  with meals and at bedtime for 14 days. 06/07/20 06/21/20  Gailen Shelter, PA    Allergies    Penicillins, Amoxicillin, and Cefixime  Review of Systems   Review of Systems  Constitutional: Negative for chills and fever.  HENT: Negative for congestion.   Eyes: Negative for pain.  Respiratory: Positive for chest tightness. Negative for cough and shortness of breath.   Cardiovascular: Positive for chest pain. Negative for leg swelling.  Gastrointestinal: Negative for abdominal pain, diarrhea, nausea and vomiting.  Genitourinary: Negative for dysuria.  Musculoskeletal: Negative for myalgias.  Skin: Negative for rash.  Neurological: Negative for dizziness and headaches.    Physical Exam Updated Vital Signs BP 116/81   Pulse 84   Temp (!) 97.5 F (36.4 C) (Oral)   Resp 15   Ht 5\' 8"  (1.727 m)   Wt 57.2 kg   SpO2 100%   BMI 19.17 kg/m   Physical Exam Vitals and nursing note reviewed.  Constitutional:      General: She is not in acute distress.    Comments: Pleasant, slightly lethargic well-appearing 29 year old female no acute distress sitting comfortably in bed.  HENT:     Head: Normocephalic and atraumatic.     Nose: Nose normal.     Mouth/Throat:     Mouth: Mucous membranes are moist.  Eyes:     General: No scleral icterus. Cardiovascular:     Rate and Rhythm: Normal rate and regular rhythm.     Pulses: Normal pulses.     Heart sounds: Normal heart sounds.     Comments: Heart rate is within normal limits at approximately 80 bpm.  3+ radial artery pulses. Pulmonary:     Effort: Pulmonary effort is normal. No respiratory distress.     Breath sounds: Normal breath sounds. No wheezing.  Abdominal:     Palpations: Abdomen is soft.     Tenderness:  There is no abdominal tenderness.  Musculoskeletal:     Cervical back: Normal range of motion and neck supple. No tenderness.     Right lower leg: No edema.     Left lower leg: No edema.  Skin:    General: Skin is warm and dry.     Capillary Refill: Capillary refill takes less than 2 seconds.  Neurological:  Mental Status: She is alert. Mental status is at baseline.     Comments: Alert and oriented to self, place, time and event.   Speech is fluent, clear without dysarthria or dysphasia.   Strength 5/5 in upper/lower extremities  Sensation intact in upper/lower extremities   Normal gait.  Negative Romberg. No pronator drift.  Normal finger-to-nose and feet tapping.  CN I not tested  CN II grossly intact visual fields bilaterally. Did not visualize posterior eye.   CN III, IV, VI PERRLA and EOMs intact bilaterally  CN V Intact sensation to sharp and light touch to the face  CN VII facial movements symmetric  CN VIII not tested  CN IX, X no uvula deviation, symmetric rise of soft palate  CN XI 5/5 SCM and trapezius strength bilaterally  CN XII Midline tongue protrusion, symmetric L/R movements   Psychiatric:     Comments: Somewhat lethargic but alert and oriented.  Able to answer questions and pleasant.  Very anxious but responds well to redirection and calm soothing voice.     ED Results / Procedures / Treatments   Labs (all labs ordered are listed, but only abnormal results are displayed) Labs Reviewed - No data to display  EKG None  Radiology DG Chest 2 View  Result Date: 06/06/2020 CLINICAL DATA:  Mid chest pain for several weeks EXAM: CHEST - 2 VIEW COMPARISON:  06/04/2020 FINDINGS: Mild hyperinflation. Heart size and pulmonary vascularity are normal. Lungs are clear. No pleural effusions. No pneumothorax. Mediastinal contours appear intact. IMPRESSION: No active cardiopulmonary disease. Electronically Signed   By: Burman Nieves M.D.   On: 06/06/2020 01:51     Procedures Procedures (including critical care time)  Medications Ordered in ED Medications - No data to display  ED Course  I have reviewed the triage vital signs and the nursing notes.  Pertinent labs & imaging results that were available during my care of the patient were reviewed by me and considered in my medical decision making (see chart for details).    Clinical Course as of Jun 07 2310  Thu Jun 07, 2020  1625 EKG with normal sinus rhythm, no ectopy, normal axis, no [id number].    [WF]    Clinical Course User Index [WF] Gailen Shelter, [place]   MDM Rules/Calculators/A&P HEAR Score: 0                        Patient is seen yesterday for primary care for she has no change in her symptoms today is primarily stating that she is very anxious and states that the hydroxyzine that she was prescribed is helping somewhat but it seems to stop working yesterday.  Physical exam is unremarkable.  Patient does appear somewhat anxious.  She states that she has seen a therapist in the past but has not seen one in the long enough that she is now considered a new patient.  She is scheduled to have a appointment with her therapist in 2 weeks and a psychiatrist in the future as well.  Patient is PERC negative.  Low suspicion for pulmonary embolism.  I reviewed patient's lab work and imaging from yesterday.  Patient had 2 view chest x-ray without abnormality.  BMP grossly within normal limits apart from very mild hypokalemia which was repleted.  Urinalysis was unremarkable.  Patient also had negative hCG, negative D-dimer, negative troponin x3  The emergent causes of chest pain include: Acute coronary syndrome, tamponade, pericarditis/myocarditis, aortic dissection,  pulmonary embolism, tension pneumothorax, pneumonia, and esophageal rupture.  I do not believe the patient has an emergent cause of chest pain, other urgent/non-acute considerations include, but are not limited to: chronic angina,  aortic stenosis, cardiomyopathy, mitral valve prolapse, pulmonary hypertension, aortic insufficiency, right ventricular hypertrophy, pleuritis, bronchitis, pneumothorax, tumor, gastroesophageal reflux disease (GERD), esophageal spasm, Mallory-Weiss syndrome, peptic ulcer disease, pancreatitis, functional gastrointestinal pain, cervical or thoracic disk disease or arthritis, shoulder arthritis, costochondritis, subacromial bursitis, anxiety or panic attack, herpes zoster, breast disorders, chest wall tumors, thoracic outlet syndrome, mediastinitis.  I had a shared decision-making conversation with patient who states that she would prefer not to have any blood work done.  I recommended blood work and further evaluation however I also think it is reasonable to discharge with conservative therapy at this time given that she has been evaluated fairly less than 1 day ago.  She understands need for close follow-up with PCP.  She will also see her psychiatrist and establish care with her therapist.  She is given hydroxyzine by myself to use as needed every 6 hours as well as melatonin for sleep. I also prescribed her Carafate and she states she feels like she is having some reflux.  Final Clinical Impression(s) / ED Diagnoses Final diagnoses:  Chest pain, unspecified type  Anxiety    Rx / DC Orders ED Discharge Orders         Ordered    hydrOXYzine (ATARAX/VISTARIL) 25 MG tablet  Every 6 hours PRN        06/07/20 1637    sucralfate (CARAFATE) 1 g tablet  3 times daily with meals & bedtime        06/07/20 1637    Melatonin 10 MG TABS  Daily at bedtime        06/07/20 1639           Gailen Shelter, [place] 06/07/20 2317    Lorre Nick, MD 06/09/20 1725

## 2020-06-07 NOTE — ED Notes (Signed)
Again pt did not want any lab work done had lab work yesterday

## 2020-06-07 NOTE — ED Triage Notes (Signed)
Pt brought in by EMS from home; to lobby via w/c with no distress noted; seen at [facility] today for c/o CP and per family has been to "numerous other places for same"; took xanax PTA

## 2020-06-07 NOTE — ED Triage Notes (Addendum)
Pt here for chest pain that started today states took a xanax to try to help symptoms with some relief. PT slow to answer questions in triage and seems slightly lethargy. Pt did have covid 1 month ago but not shob noted. Pt states she was seen at [facility] ED today and was told it was anxiety related.
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
## 2020-06-07 NOTE — ED Triage Notes (Signed)
The pt arrived by [place] from home reportedly the pt has been here 3-4 times each week  She just had blood drawn yesterday somewhere  She is erefusing the blood draw at present  ekg only  lmp   Several months

## 2020-06-08 ENCOUNTER — Encounter (HOSPITAL_COMMUNITY): Payer: Self-pay | Admitting: *Deleted

## 2020-06-08 ENCOUNTER — Emergency Department
Admission: EM | Admit: 2020-06-08 | Discharge: 2020-06-08 | Disposition: A | Discharge status: Home / Self Care | Payer: BC Managed Care – PPO | Attending: Emergency Medicine | Admitting: Emergency Medicine

## 2020-06-08 ENCOUNTER — Encounter: Payer: Self-pay | Admitting: Internal Medicine

## 2020-06-08 ENCOUNTER — Emergency Department (HOSPITAL_COMMUNITY)
Admission: EM | Admit: 2020-06-08 | Discharge: 2020-06-08 | Disposition: A | Discharge status: Home / Self Care | Payer: BC Managed Care – PPO | Attending: Emergency Medicine | Admitting: Emergency Medicine

## 2020-06-08 ENCOUNTER — Ambulatory Visit (INDEPENDENT_AMBULATORY_CARE_PROVIDER_SITE_OTHER): Payer: BC Managed Care – PPO | Admitting: Internal Medicine

## 2020-06-08 VITALS — BP 98/76 | HR 107 | Temp 98.4°F | Wt 127.4 lb

## 2020-06-08 DIAGNOSIS — Z87891 Personal history of nicotine dependence: Secondary | ICD-10-CM | POA: Diagnosis not present

## 2020-06-08 DIAGNOSIS — Z09 Encounter for follow-up examination after completed treatment for conditions other than malignant neoplasm: Secondary | ICD-10-CM

## 2020-06-08 DIAGNOSIS — F410 Panic disorder [episodic paroxysmal anxiety] without agoraphobia: Secondary | ICD-10-CM | POA: Insufficient documentation

## 2020-06-08 DIAGNOSIS — R0789 Other chest pain: Secondary | ICD-10-CM | POA: Diagnosis not present

## 2020-06-08 DIAGNOSIS — F411 Generalized anxiety disorder: Secondary | ICD-10-CM | POA: Diagnosis not present

## 2020-06-08 LAB — TSH: TSH: 2.778 u[IU]/mL (ref 0.350–4.500)

## 2020-06-08 LAB — T4, FREE: Free T4: 1.04 ng/dL (ref 0.61–1.12)

## 2020-06-08 MED ORDER — SERTRALINE HCL 50 MG PO TABS: 50.0000 mg | ORAL_TABLET | Freq: Every day | ORAL | 1 refills | Status: AC

## 2020-06-08 MED ORDER — ALPRAZOLAM 0.25 MG PO TABS: 0.2500 mg | ORAL_TABLET | Freq: Two times a day (BID) | ORAL | 0 refills | Status: AC | PRN

## 2020-06-08 NOTE — Progress Notes (Signed)
Established Patient Office Visit     This visit occurred during the SARS-CoV-2 public health emergency.  Safety protocols were in place, including screening questions prior to the visit, additional usage of staff PPE, and extensive cleaning of exam room while observing appropriate contact time as indicated for disinfecting solutions.    CC/Reason for Visit: Multiple ED follow-ups, chest pain, anxiety  HPI: Donna Leach is a 29 y.o. female who is coming in today for the above mentioned reasons. Past Medical History is significant for: History of chronic anxiety but never been diagnosed as anxiety disorder.  She was diagnosed with Covid in early September.  Ever since then she has noticed increased anxiety symptoms with chest discomfort.  She states her heart rate is always high.  She has had at least 8 ED visits that I can count, most recently this morning.  I have spent considerable amount of time reviewing all of her ED records.  She has had multiple EKGs, lab work, x-rays, CT scans that have all been reassuring.  She even had an MRI of the brain and her C, T, L-spine that were within normal limits.  CT angiogram of the chest was negative for PE, there was a very small pulmonary nodule that we do not need to follow-up on as she is a non-smoker.  She has been seen by behavioral health during a couple of these admissions and although inpatient treatment has not been recommended, they have scheduled an outpatient appointment with psychiatry for her on 10/15.  She did wear a cardiac monitor for 8 days, result of this is currently pending.  They prescribed hydroxyzine for her during one of her ED visits it was of no help, she was then prescribed Xanax which she feels helps better but she no longer has any remaining.   Past Medical/Surgical History: Past Medical History:  Diagnosis Date  . Depression   . No pertinent past medical history     Past Surgical History:  Procedure  Laterality Date  . Adnoids    . APPENDECTOMY      Social History:  reports that she quit smoking about 3 years ago. She quit after 2.00 years of use. She has never used smokeless tobacco. She reports current alcohol use of about 1.0 standard drink of alcohol per week. She reports that she does not use drugs.  Allergies: Allergies  Allergen Reactions  . Penicillins Hives    Has patient had a PCN reaction causing immediate rash, facial/tongue/throat swelling, SOB or lightheadedness with hypotension: Y Has patient had a PCN reaction causing severe rash involving mucus membranes or skin necrosis: Y Has patient had a PCN reaction that required hospitalization: N Has patient had a PCN reaction occurring within the last 10 years: N If all of the above answers are "NO", then may proceed with Cephalosporin use.  Has patient had a PCN reaction causing immediate rash, facial/tongue/throat swelling, SOB or lightheadedness with hypotension: Y Has patient had a PCN reaction causing severe rash involving mucus membranes or skin necrosis: Y Has patient had a PCN reaction that required hospitalization: N Has patient had a PCN reaction occurring within the last 10 years: N If all of the above answers are "NO", then may proceed with Cephalosporin use. Has patient had a PCN reaction causing immediate rash, facial/tongue/throat swelling, SOB or lightheadedness with hypotension: Y Has patient had a PCN reaction causing severe rash involving mucus membranes or skin necrosis: Y Has patient had a PCN  reaction that required hospitalization: N Has patient had a PCN reaction occurring within the last 10 years: N If all of the above answers are "NO", then may proceed with Cephalosporin use. Has patient had a PCN reaction causing immediate rash, facial/tongue/throat swelling, SOB or lightheadedness with hypotension: Y Has patient had a PCN reaction causing severe rash involving mucus membranes or skin necrosis:  Y Has patient had a PCN reaction that required hospitalization: N Has patient had a PCN reaction occurring within the last 10 years: N If all of the above answers are "NO", then may proceed with Cephalosporin use. Has patient had a PCN reaction causing immediate rash, facial/tongue/throat swelling, SOB or lightheadedness with hypotension: Y Has patient had a PCN reaction causing severe rash involving mucus membranes or skin necrosis: Y Has patient had a PCN reaction that required hospitalization: N Has patient had a PCN reaction occurring within the last 10 years: N If all of the above answers are "NO", then may proceed with Cephalosporin use. Has patient had a PCN reaction causing immediate rash, facial/tongue/throat swelling, SOB or lightheadedness with hypotension: Y Has patient had a PCN reaction causing severe rash involving mucus membranes or skin necrosis: Y Has patient had a PCN reaction that required hospitalization: N Has patient had a PCN reaction occurring within the last 10 years: N If all of the above answers are "NO", then may proceed with Cephalosporin use. Has patient had a PCN reaction causing immediate rash, facial/tongue/throat swelling, SOB or lightheadedness with hypotension: Y Has patient had a PCN reaction causing severe rash involvi... (TRUNCATED) Has patient had a PCN reaction causing immediate rash, facial/tongue/throat swelling, SOB or lightheadedness with hypotension: Y Has patient had a PCN reaction causing severe rash involving mucus membranes or skin necrosis: Y Has patient had a PCN reaction that required hospitalization: N Has patient had a PCN reaction occurring within the last 10 years: N If all of the above answers are "NO", then may proceed with Cephalosporin use. Has patient had a PCN reaction causing immediate rash, facial/tongue/throat swelling, SOB or lightheadedness with hypotension: Y Has patient had a PCN reaction causing severe rash involving mucus  membranes or skin necrosis: Y Has patient had a PCN reaction that required hospitalization: N Has patient had a PCN reaction occurring within the last 10 years: N If all of the above answers are "NO", then may proceed with Cephalosporin use. Has patient had a PCN reaction causing immediate rash, facial/tongue/throat swelling, SOB or lightheadedness with hypotension: Y Has patient had a PCN reaction causing severe rash involving mucus membranes or skin necrosis: Y Has patient had a PCN reaction that required hospitalization: N Has patient had a PCN reaction occurring within the last 10 years: N If all of the above answers are "NO", then may proceed with Cephalosporin use. Has patient had a PCN reaction causing immediate rash, facial/tongue/throat swelling, SOB or lightheadedness with hypotension: Y Has patient had a PCN reaction causing severe rash involvi... (TRUNCATED)   . Amoxicillin Hives  . Cefixime Hives    Family History:  Family History  Problem Relation Age of Onset  . Hypertension Father   . Diabetes Father   . Diabetes Mother   . Hyperthyroidism Other      Current Outpatient Medications:  Marland Kitchen  Melatonin 10 MG TABS, Take 1 tablet by mouth at bedtime for 14 days., Disp: 14 tablet, Rfl: 0 .  potassium chloride SA (KLOR-CON) 20 MEQ tablet, Take 1 tablet (20 mEq total) by mouth 2 (  two) times daily., Disp: 20 tablet, Rfl: 0 .  sucralfate (CARAFATE) 1 g tablet, Take 1 tablet (1 g total) by mouth 4 (four) times daily -  with meals and at bedtime for 14 days., Disp: 56 tablet, Rfl: 0 .  ALPRAZolam (XANAX) 0.25 MG tablet, Take 1 tablet (0.25 mg total) by mouth 2 (two) times daily as needed for anxiety., Disp: 30 tablet, Rfl: 0 .  hydrOXYzine (ATARAX/VISTARIL) 25 MG tablet, Take 1 tablet (25 mg total) by mouth every 6 (six) hours as needed for up to 14 days for anxiety. (Patient not taking: Reported on 06/08/2020), Disp: 56 tablet, Rfl: 0 .  sertraline (ZOLOFT) 50 MG tablet, Take 1 tablet  (50 mg total) by mouth daily., Disp: 90 tablet, Rfl: 1  Review of Systems:  Constitutional: Denies fever, chills, diaphoresis, appetite change and fatigue.  HEENT: Denies photophobia, eye pain, redness, hearing loss, ear pain, congestion, sore throat, rhinorrhea, sneezing, mouth sores, trouble swallowing, neck pain, neck stiffness and tinnitus.   Respiratory: Denies SOB, DOE, cough, chest tightness,  and wheezing.   Cardiovascular: Denies leg swelling.  Gastrointestinal: Denies nausea, vomiting, abdominal pain, diarrhea, constipation, blood in stool and abdominal distention.  Genitourinary: Denies dysuria, urgency, frequency, hematuria, flank pain and difficulty urinating.  Endocrine: Denies: hot or cold intolerance, sweats, changes in hair or nails, polyuria, polydipsia. Musculoskeletal: Denies myalgias, back pain, joint swelling, arthralgias and gait problem.  Skin: Denies pallor, rash and wound.  Neurological: Denies dizziness, seizures, syncope, weakness, light-headedness, numbness and headaches.  Hematological: Denies adenopathy. Easy bruising, personal or family bleeding history  Psychiatric/Behavioral: Denies suicidal ideation,, confusion.    Physical Exam: Vitals:   06/08/20 1110  BP: 98/76  Pulse: (!) 107  Temp: 98.4 F (36.9 C)  TempSrc: Oral  SpO2: 98%  Weight: 127 lb 6.4 oz (57.8 kg)     Body mass index is 19.37 kg/m.   Constitutional: NAD, calm, comfortable, lying in stretcher in exam room, hyperventilating stating "I can feel the panic coming on". Eyes: PERRL, lids and conjunctivae normal ENMT: Mucous membranes are moist. Respiratory: clear to auscultation bilaterally, no wheezing, no crackles. Normal respiratory effort. No accessory muscle use.  Cardiovascular: Regular rate and rhythm, no murmurs / rubs / gallops. No extremity edema.  Neurologic: Grossly intact and nonfocal. Psychiatric: Normal judgment and insight. Alert and oriented x 3.  Mood is overtly  anxious.  Impression and Plan:  GAD (generalized anxiety disorder) Hospital discharge follow-up    Office Visit from 06/08/2020 in [facility] [place]  PHQ-9 Total Score 11       -Multiple ED visits for chest pain and anxiety. -Start Zoloft 50 mg daily.  I will also give her some Xanax 0.25 mg that she may take up to twice a day as needed for anxiety. -She has an appointment with psychiatry on 10/15.  She understands that these medications may change once seen by psychiatry.   Time spent: 33 minutes  Patient Instructions  -Nice seeing you today!!  -Start Zoloft 50 mg daily.  -May use Xanax 0.25 mg twice a day as needed for anxiety.   -These medications may change once you see psychiatry.       [name], MD [facility]

## 2020-06-08 NOTE — ED Triage Notes (Signed)
To ED for further eval of cp and anxiety. Pt just left [facility]. Pt had lab work done there and states she's had several CXrays for same. Appears in nad.

## 2020-06-08 NOTE — Patient Instructions (Signed)
-  Nice seeing you today!!  -Start Zoloft 50 mg daily.  -May use Xanax 0.25 mg twice a day as needed for anxiety.   -These medications may change once you see psychiatry.

## 2020-06-08 NOTE — Discharge Instructions (Signed)

## 2020-06-08 NOTE — ED Provider Notes (Signed)
[facility] EMERGENCY DEPARTMENT Provider Note   CSN: [id number] Arrival date & time: 06/08/20  7829     History Chief Complaint  Patient presents with  . Chest Pain    Donna Leach is a 29 y.o. female who presents with c/o chest pain.  She has a history of anxiety depression is not on any medications for this.  The patient has had numerous visits for the same complaint over the past 2 weeks.  She has checked into the emergency department more than 8 times and frequently left without being seen.  Patient complains of chest pain and tightness that occurs in various places for more than 1 month after she was diagnosed with COVID-19.  She does not have any more symptoms of COVID-19 at this point.  She has an appointment with her primary care doctor today at 11:00 but states that every time she calls the nurse tells her to go to the emergency room because of chest pain.  She has an upcoming appointment with a cardiologist and states that they told her not to take her hydroxyzine.  She states that yesterday she had an episode of chest pain that came on and was sharp.  She states that it scared her and so she began to panic.  She states that this seemed to last longer than normal and so she came for evaluation.  She apparently went to [facility] had labs drawn including a CBC, CMP troponin, ethanol TSH and free T4.  All of these were unremarkable.  She had an EKG done yesterday that showed normal sinus rhythm at a rate of 82.  He had a behavioral health evaluation and was found to be safe for outpatient psych care. She had magnesium, D-dimer, pregnancy test, lipase, urine and troponins on 929 which were all negative as well.  HPI     Past Medical History:  Diagnosis Date  . Depression   . No pertinent past medical history     Patient Active Problem List   Diagnosis Date Noted  . Major depressive disorder, recurrent episode, moderate (HCC)   .  Generalized anxiety disorder   . Thyroiditis 11/04/2018  . Traumatic hematoma of finger 11/06/2016  . Depression 04/08/2016    Past Surgical History:  Procedure Laterality Date  . Adnoids    . APPENDECTOMY       OB History    Gravida  0   Para      Term      Preterm      AB      Living        SAB      TAB      Ectopic      Multiple      Live Births              Family History  Problem Relation Age of Onset  . Hypertension Father   . Diabetes Father   . Diabetes Mother   . Hyperthyroidism Other     Social History   Tobacco Use  . Smoking status: Former Smoker    Years: 2.00    Quit date: 11/14/2016    Years since quitting: 3.5  . Smokeless tobacco: Never Used  . Tobacco comment: no cigarettes in 3 months  Substance Use Topics  . Alcohol use: Yes    Alcohol/week: 1.0 standard drink    Types: 1 Standard drinks or equivalent per week    Comment: socially  .  Drug use: No    Home Medications Prior to Admission medications   Medication Sig Start Date End Date Taking? Authorizing Provider  ALPRAZolam (XANAX) 0.25 MG tablet Take 1 tablet (0.25 mg total) by mouth 2 (two) times daily as needed for anxiety. 06/08/20   Philip Aspen, Limmie Patricia, MD  hydrOXYzine (ATARAX/VISTARIL) 25 MG tablet Take 1 tablet (25 mg total) by mouth every 6 (six) hours as needed for up to 14 days for anxiety. Patient not taking: Reported on 06/08/2020 06/07/20 06/21/20  Gailen Shelter, PA  Melatonin 10 MG TABS Take 1 tablet by mouth at bedtime for 14 days. 06/07/20 06/21/20  Gailen Shelter, PA  potassium chloride SA (KLOR-CON) 20 MEQ tablet Take 1 tablet (20 mEq total) by mouth 2 (two) times daily. 06/06/20   Dione Booze, MD  sertraline (ZOLOFT) 50 MG tablet Take 1 tablet (50 mg total) by mouth daily. 06/08/20   Philip Aspen, Limmie Patricia, MD  sucralfate (CARAFATE) 1 g tablet Take 1 tablet (1 g total) by mouth 4 (four) times daily -  with meals and at bedtime for 14 days.  06/07/20 06/21/20  Gailen Shelter, PA    Allergies    Penicillins, Amoxicillin, and Cefixime  Review of Systems   Review of Systems Ten systems reviewed and are negative for acute change, except as noted in the HPI.   Physical Exam Updated Vital Signs BP 123/84   Pulse 86   Temp 98.4 F (36.9 C)   Resp 16   SpO2 100%   Physical Exam Vitals and nursing note reviewed.  Constitutional:      General: She is not in acute distress.    Appearance: She is well-developed and underweight. She is not diaphoretic.  HENT:     Head: Normocephalic and atraumatic.  Eyes:     General: No scleral icterus.    Conjunctiva/sclera: Conjunctivae normal.  Cardiovascular:     Rate and Rhythm: Normal rate and regular rhythm.     Heart sounds: Normal heart sounds. No murmur heard.  No friction rub. No gallop.   Pulmonary:     Effort: Pulmonary effort is normal. No respiratory distress.     Breath sounds: Normal breath sounds.  Abdominal:     General: Bowel sounds are normal. There is no distension.     Palpations: Abdomen is soft. There is no mass.     Tenderness: There is no abdominal tenderness. There is no guarding.  Musculoskeletal:     Cervical back: Normal range of motion.  Skin:    General: Skin is warm and dry.  Neurological:     Mental Status: She is alert and oriented to person, place, and time.  Psychiatric:        Mood and Affect: Mood is anxious. Affect is tearful.        Behavior: Behavior normal. Behavior is cooperative.     ED Results / Procedures / Treatments   Labs (all labs ordered are listed, but only abnormal results are displayed) Labs Reviewed - No data to display  EKG None  Radiology No results found.  Procedures Procedures (including critical care time)  Medications Ordered in ED Medications - No data to display  ED Course  I have reviewed the triage vital signs and the nursing notes.  Pertinent labs & imaging results that were available during  my care of the patient were reviewed by me and considered in my medical decision making (see chart for details).  MDM Rules/Calculators/A&P                          Patient with multiple recent and previous workups reassuring for no emergent cause of her symptoms.  Labs within normal limits within the past 24 hours.  I do not feel that there is any reason to repeat any work-up at this time.  She has a follow-up appointment with her primary care doctor within the hour.  I sent a secure message attached to the patient's PCP.  Patient will be discharged to go directly to her PCPs office at this time. Final Clinical Impression(s) / ED Diagnoses Final diagnoses:  Panic anxiety syndrome    Rx / DC Orders ED Discharge Orders    None       Arthor Captain, PA-C 06/08/20 1502    Maia Plan, MD 06/13/20 2010

## 2020-06-09 DIAGNOSIS — T7840XA Allergy, unspecified, initial encounter: Secondary | ICD-10-CM | POA: Diagnosis not present

## 2020-06-09 DIAGNOSIS — R0602 Shortness of breath: Secondary | ICD-10-CM | POA: Diagnosis not present

## 2020-06-09 DIAGNOSIS — R251 Tremor, unspecified: Secondary | ICD-10-CM | POA: Diagnosis not present

## 2020-06-09 DIAGNOSIS — R531 Weakness: Secondary | ICD-10-CM | POA: Diagnosis not present

## 2020-06-09 DIAGNOSIS — R000 Tachycardia, unspecified: Secondary | ICD-10-CM | POA: Diagnosis not present

## 2020-06-09 DIAGNOSIS — Z87891 Personal history of nicotine dependence: Secondary | ICD-10-CM | POA: Diagnosis not present

## 2020-06-09 DIAGNOSIS — R0789 Other chest pain: Secondary | ICD-10-CM | POA: Diagnosis not present

## 2020-06-09 DIAGNOSIS — R5381 Other malaise: Secondary | ICD-10-CM | POA: Diagnosis not present

## 2020-06-09 DIAGNOSIS — E161 Other hypoglycemia: Secondary | ICD-10-CM | POA: Diagnosis not present

## 2020-06-09 DIAGNOSIS — M79662 Pain in left lower leg: Secondary | ICD-10-CM | POA: Diagnosis not present

## 2020-06-09 DIAGNOSIS — R21 Rash and other nonspecific skin eruption: Secondary | ICD-10-CM | POA: Diagnosis not present

## 2020-06-09 DIAGNOSIS — R9431 Abnormal electrocardiogram [ECG] [EKG]: Secondary | ICD-10-CM | POA: Diagnosis not present

## 2020-06-09 DIAGNOSIS — X58XXXA Exposure to other specified factors, initial encounter: Secondary | ICD-10-CM | POA: Diagnosis not present

## 2020-06-09 DIAGNOSIS — T50905A Adverse effect of unspecified drugs, medicaments and biological substances, initial encounter: Secondary | ICD-10-CM | POA: Diagnosis not present

## 2020-06-09 DIAGNOSIS — F419 Anxiety disorder, unspecified: Secondary | ICD-10-CM | POA: Diagnosis not present

## 2020-06-10 DIAGNOSIS — X58XXXA Exposure to other specified factors, initial encounter: Secondary | ICD-10-CM | POA: Diagnosis not present

## 2020-06-10 DIAGNOSIS — M79662 Pain in left lower leg: Secondary | ICD-10-CM | POA: Diagnosis not present

## 2020-06-10 DIAGNOSIS — R0602 Shortness of breath: Secondary | ICD-10-CM | POA: Diagnosis not present

## 2020-06-10 DIAGNOSIS — R9431 Abnormal electrocardiogram [ECG] [EKG]: Secondary | ICD-10-CM | POA: Diagnosis not present

## 2020-06-10 DIAGNOSIS — Z87891 Personal history of nicotine dependence: Secondary | ICD-10-CM | POA: Diagnosis not present

## 2020-06-10 DIAGNOSIS — T7840XA Allergy, unspecified, initial encounter: Secondary | ICD-10-CM | POA: Diagnosis not present

## 2020-06-10 DIAGNOSIS — R21 Rash and other nonspecific skin eruption: Secondary | ICD-10-CM | POA: Diagnosis not present

## 2020-06-11 DIAGNOSIS — R1084 Generalized abdominal pain: Secondary | ICD-10-CM | POA: Diagnosis not present

## 2020-06-12 ENCOUNTER — Encounter (HOSPITAL_COMMUNITY): Payer: Self-pay | Admitting: Emergency Medicine

## 2020-06-12 ENCOUNTER — Emergency Department
Admission: EM | Admit: 2020-06-12 | Discharge: 2020-06-12 | Disposition: A | Discharge status: Home / Self Care | Payer: BC Managed Care – PPO | Attending: Emergency Medicine | Admitting: Emergency Medicine

## 2020-06-12 ENCOUNTER — Encounter: Payer: Self-pay | Admitting: Internal Medicine

## 2020-06-12 ENCOUNTER — Other Ambulatory Visit: Payer: Self-pay

## 2020-06-12 ENCOUNTER — Emergency Department (HOSPITAL_COMMUNITY)
Admission: EM | Admit: 2020-06-12 | Discharge: 2020-06-13 | Disposition: A | Discharge status: Home / Self Care | Payer: BC Managed Care – PPO | Attending: Emergency Medicine | Admitting: Emergency Medicine

## 2020-06-12 DIAGNOSIS — R519 Headache, unspecified: Secondary | ICD-10-CM | POA: Insufficient documentation

## 2020-06-12 DIAGNOSIS — F5109 Other insomnia not due to a substance or known physiological condition: Secondary | ICD-10-CM | POA: Diagnosis not present

## 2020-06-12 DIAGNOSIS — R002 Palpitations: Secondary | ICD-10-CM | POA: Insufficient documentation

## 2020-06-12 DIAGNOSIS — F329 Major depressive disorder, single episode, unspecified: Secondary | ICD-10-CM | POA: Diagnosis not present

## 2020-06-12 DIAGNOSIS — Z87891 Personal history of nicotine dependence: Secondary | ICD-10-CM | POA: Insufficient documentation

## 2020-06-12 DIAGNOSIS — G4709 Other insomnia: Secondary | ICD-10-CM

## 2020-06-12 DIAGNOSIS — R1013 Epigastric pain: Secondary | ICD-10-CM | POA: Diagnosis not present

## 2020-06-12 DIAGNOSIS — U071 COVID-19: Secondary | ICD-10-CM | POA: Diagnosis not present

## 2020-06-12 DIAGNOSIS — F411 Generalized anxiety disorder: Secondary | ICD-10-CM | POA: Insufficient documentation

## 2020-06-12 DIAGNOSIS — R001 Bradycardia, unspecified: Secondary | ICD-10-CM | POA: Diagnosis not present

## 2020-06-12 DIAGNOSIS — F341 Dysthymic disorder: Secondary | ICD-10-CM | POA: Diagnosis not present

## 2020-06-12 DIAGNOSIS — R110 Nausea: Secondary | ICD-10-CM | POA: Diagnosis not present

## 2020-06-12 DIAGNOSIS — K319 Disease of stomach and duodenum, unspecified: Secondary | ICD-10-CM

## 2020-06-12 DIAGNOSIS — R457 State of emotional shock and stress, unspecified: Secondary | ICD-10-CM | POA: Diagnosis not present

## 2020-06-12 LAB — URINALYSIS, COMPLETE (UACMP) WITH MICROSCOPIC
Bilirubin Urine: NEGATIVE
Glucose, UA: NEGATIVE mg/dL
Ketones, ur: NEGATIVE mg/dL
Leukocytes,Ua: NEGATIVE
Nitrite: NEGATIVE
Protein, ur: NEGATIVE mg/dL
Specific Gravity, Urine: 1.027 (ref 1.005–1.030)
pH: 5 (ref 5.0–8.0)

## 2020-06-12 LAB — POCT PREGNANCY, URINE: Preg Test, Ur: NEGATIVE

## 2020-06-12 MED ORDER — TRAZODONE HCL 50 MG PO TABS: 50.0000 mg | ORAL_TABLET | Freq: Every day | ORAL | 0 refills | Status: AC

## 2020-06-12 NOTE — ED Notes (Signed)
Pt having cereal and ginger ale

## 2020-06-12 NOTE — ED Notes (Signed)
Pt refuses to have labs drawn until she speaks with a doctor, states she has had a lot of labs drawn lately and they have all been normal.

## 2020-06-12 NOTE — ED Triage Notes (Signed)
To ED via [facility]  From home- was seen at [facility] this am dx with anxiety , took the trazadone as directed, now is sleepy and has a headache.

## 2020-06-12 NOTE — ED Notes (Signed)
Pt wants to be tested for flu and covid, no symptoms.

## 2020-06-12 NOTE — Discharge Instructions (Addendum)
Please continue taking Pepcid but increase to twice a day.

## 2020-06-12 NOTE — ED Provider Notes (Signed)
[facility] EMERGENCY DEPARTMENT Provider Note   CSN: [id number] Arrival date & time: 06/12/20  1823     History Chief Complaint  Patient presents with  . Headache  . new meds    Donna Leach is a 29 y.o. female.  29 year old female presents to the emergency department for complaints of a headache.  She states that she has a pressure sensation in her head which began 2 days ago.  It feels worse when she sits up.  Previously had taken aspirin for her symptoms without relief.  Symptoms associated with palpitations, feeling as though her heart is beating fast.  She was started on Trazadone by her PCP 2 days ago and thinks this may be causing her headache. Hasn't been sleeping well; reportedly only 3-4 hours per night. Has been seen in her PCP office, Urgent Care, or ED for various complaints x 9 in the past 5 days.  The history is provided by the patient. No language interpreter was used.  Headache      Past Medical History:  Diagnosis Date  . Depression   . No pertinent past medical history     Patient Active Problem List   Diagnosis Date Noted  . Major depressive disorder, recurrent episode, moderate (HCC)   . Generalized anxiety disorder   . Thyroiditis 11/04/2018  . Traumatic hematoma of finger 11/06/2016  . Depression 04/08/2016    Past Surgical History:  Procedure Laterality Date  . Adnoids    . APPENDECTOMY       OB History    Gravida  0   Para      Term      Preterm      AB      Living        SAB      TAB      Ectopic      Multiple      Live Births              Family History  Problem Relation Age of Onset  . Hypertension Father   . Diabetes Father   . Diabetes Mother   . Hyperthyroidism Other     Social History   Tobacco Use  . Smoking status: Former Smoker    Years: 2.00    Quit date: 11/14/2016    Years since quitting: 3.5  . Smokeless tobacco: Never Used  . Tobacco comment: no cigarettes in 3  months  Substance Use Topics  . Alcohol use: Yes    Alcohol/week: 1.0 standard drink    Types: 1 Standard drinks or equivalent per week    Comment: socially  . Drug use: No    Home Medications Prior to Admission medications   Medication Sig Start Date End Date Taking? Authorizing Provider  ALPRAZolam (XANAX) 0.25 MG tablet Take 1 tablet (0.25 mg total) by mouth 2 (two) times daily as needed for anxiety. 06/08/20   Philip Aspen, Limmie Patricia, MD  hydrOXYzine (ATARAX/VISTARIL) 25 MG tablet Take 1 tablet (25 mg total) by mouth every 6 (six) hours as needed for up to 14 days for anxiety. Patient not taking: Reported on 06/08/2020 06/07/20 06/21/20  Gailen Shelter, PA  Melatonin 10 MG TABS Take 1 tablet by mouth at bedtime for 14 days. 06/07/20 06/21/20  Gailen Shelter, PA  potassium chloride SA (KLOR-CON) 20 MEQ tablet Take 1 tablet (20 mEq total) by mouth 2 (two) times daily. 06/06/20   Dione Booze, MD  sertraline (  ZOLOFT) 50 MG tablet Take 1 tablet (50 mg total) by mouth daily. 06/08/20   Philip Aspen, Limmie Patricia, MD  sucralfate (CARAFATE) 1 g tablet Take 1 tablet (1 g total) by mouth 4 (four) times daily -  with meals and at bedtime for 14 days. 06/07/20 06/21/20  Gailen Shelter, PA  traZODone (DESYREL) 50 MG tablet Take 1 tablet (50 mg total) by mouth at bedtime. 06/12/20   Merwyn Katos, MD    Allergies    Penicillins, Amoxicillin, and Cefixime  Review of Systems   Review of Systems  Neurological: Positive for headaches.  Ten systems reviewed and are negative for acute change, except as noted in the HPI.    Physical Exam Updated Vital Signs BP 110/74 (BP Location: Right Arm)   Pulse 63   Temp 98.1 F (36.7 C) (Oral)   Resp 15   SpO2 100%   Physical Exam Vitals and nursing note reviewed.  Constitutional:      General: She is not in acute distress.    Appearance: She is well-developed. She is not diaphoretic.     Comments: Nontoxic appearing and in NAD  HENT:      Head: Normocephalic and atraumatic.  Eyes:     General: No scleral icterus.    Conjunctiva/sclera: Conjunctivae normal.  Pulmonary:     Effort: Pulmonary effort is normal. No respiratory distress.     Comments: Respirations even and unlabored Musculoskeletal:        General: Normal range of motion.     Cervical back: Normal range of motion.  Skin:    General: Skin is warm and dry.     Coloration: Skin is not pale.     Findings: No erythema or rash.  Neurological:     Mental Status: She is alert and oriented to person, place, and time.     Comments: GCS 15. Speech is clear, goal oriented. Answers questions appropriately and follows commands. Moving all extremities spontaneously. No obvious focal deficits.   Psychiatric:        Mood and Affect: Mood is depressed.        Behavior: Behavior normal.     Comments: Dysphoric mood.      ED Results / Procedures / Treatments   Labs (all labs ordered are listed, but only abnormal results are displayed) Labs Reviewed - No data to display  EKG None  Radiology No results found.  Procedures Procedures (including critical care time)  Medications Ordered in ED Medications - No data to display  ED Course  I have reviewed the triage vital signs and the nursing notes.  Pertinent labs & imaging results that were available during my care of the patient were reviewed by me and considered in my medical decision making (see chart for details).    MDM Rules/Calculators/A&P                          29 year old female complaining of a headache characterized as pressure x2 days.  She is normotensive with reassuring vital signs.  No fever, nuchal rigidity, meningismus.  No obvious focal deficits on exam.  Did undergo extensive MRI 1 week ago for neurologic complaints which was negative.  Believes that her symptoms may be related to starting trazodone 2 days ago.  I have offered the patient medications for headache management in the ED which she  declines.  Have encouraged her to continue follow-up with her primary doctor.  Did  discuss with the patient that anxiety and/or lack of sleep may be contributing to her symptoms.  She has been seen in an ED, urgent care, or outpatient office setting 9 times in the past 5 days.  Do not feel further emergent work-up is clinically indicated.  Appropriate for discharge and continued PCP follow-up.  Discharged in stable condition.  Matisha Termine Greenlaw was evaluated in Emergency Department on 06/12/2020 for the symptoms described in the history of present illness. She was evaluated in the context of the global COVID-19 pandemic, which necessitated consideration that the patient might be at risk for infection with the SARS-CoV-2 virus that causes COVID-19. Institutional protocols and algorithms that pertain to the evaluation of patients at risk for COVID-19 are in a state of rapid change based on information released by regulatory bodies including the CDC and federal and state organizations. These policies and algorithms were followed during the patient's care in the ED.   Final Clinical Impression(s) / ED Diagnoses Final diagnoses:  Acute nonintractable headache, unspecified headache type    Rx / DC Orders ED Discharge Orders    None       Antony Madura, PA-C 06/12/20 2357    Wynetta Fines, MD 06/15/20 [phone]

## 2020-06-12 NOTE — ED Notes (Signed)
Pt alert in stretcher, on phone call while trying to obtain vital signs, does not stop phone call to answer questions. Pt appears comfortable, no distress noted. Will continue to monitor

## 2020-06-12 NOTE — ED Triage Notes (Signed)
Pt in via [facility] from home with c/o mental health problems. Pt ha been stressed for last couple of weeks and has not been able to sleep. Pt states to EMS that if her condition continues like this it could be life threatening for her. Pt also c/o chest pressure after eating  116/80, HR 70, RR 16, 99% RA, 97.0

## 2020-06-12 NOTE — ED Provider Notes (Signed)
[facility] Emergency Department Provider Note   ____________________________________________   First MD Initiated Contact with Patient 06/12/20 1157     (approximate)  I have reviewed the triage vital signs and the nursing notes.   HISTORY  Chief Complaint Anxiety and Abdominal Pain    HPI Donna Leach is a 29 y.o. female with a stated past medical history of depression/anxiety who presents for intermittent midepigastric pain.  Patient states that this midepigastric pain is worsened whenever she eats and has been worsening over the past 2 weeks.  Patient denies any relieving factors but is concerned that it is related to her insomnia that has also been worsening over the last 2 weeks after being diagnosed with tachycardia/bradycardia syndrome.  Patient states that she wakes every 2 hours with tachycardia that causes severe anxiety and keeps her from going back to sleep.         Past Medical History:  Diagnosis Date  . Depression   . No pertinent past medical history     Patient Active Problem List   Diagnosis Date Noted  . Major depressive disorder, recurrent episode, moderate (HCC)   . Generalized anxiety disorder   . Thyroiditis 11/04/2018  . Traumatic hematoma of finger 11/06/2016  . Depression 04/08/2016    Past Surgical History:  Procedure Laterality Date  . Adnoids    . APPENDECTOMY      Prior to Admission medications   Medication Sig Start Date End Date Taking? Authorizing Provider  ALPRAZolam (XANAX) 0.25 MG tablet Take 1 tablet (0.25 mg total) by mouth 2 (two) times daily as needed for anxiety. 06/08/20   Philip Aspen, Limmie Patricia, MD  hydrOXYzine (ATARAX/VISTARIL) 25 MG tablet Take 1 tablet (25 mg total) by mouth every 6 (six) hours as needed for up to 14 days for anxiety. Patient not taking: Reported on 06/08/2020 06/07/20 06/21/20  Gailen Shelter, PA  Melatonin 10 MG TABS Take 1 tablet by mouth at bedtime  for 14 days. 06/07/20 06/21/20  Gailen Shelter, PA  potassium chloride SA (KLOR-CON) 20 MEQ tablet Take 1 tablet (20 mEq total) by mouth 2 (two) times daily. 06/06/20   Dione Booze, MD  sertraline (ZOLOFT) 50 MG tablet Take 1 tablet (50 mg total) by mouth daily. 06/08/20   Philip Aspen, Limmie Patricia, MD  sucralfate (CARAFATE) 1 g tablet Take 1 tablet (1 g total) by mouth 4 (four) times daily -  with meals and at bedtime for 14 days. 06/07/20 06/21/20  Gailen Shelter, PA  traZODone (DESYREL) 50 MG tablet Take 1 tablet (50 mg total) by mouth at bedtime. 06/12/20   Merwyn Katos, MD    Allergies Penicillins, Amoxicillin, and Cefixime  Family History  Problem Relation Age of Onset  . Hypertension Father   . Diabetes Father   . Diabetes Mother   . Hyperthyroidism Other     Social History Social History   Tobacco Use  . Smoking status: Former Smoker    Years: 2.00    Quit date: 11/14/2016    Years since quitting: 3.5  . Smokeless tobacco: Never Used  . Tobacco comment: no cigarettes in 3 months  Substance Use Topics  . Alcohol use: Yes    Alcohol/week: 1.0 standard drink    Types: 1 Standard drinks or equivalent per week    Comment: socially  . Drug use: No    Review of Systems Constitutional: No fever/chills Eyes: No visual changes. ENT: No sore  throat. Cardiovascular: Denies chest pain. Respiratory: Denies shortness of breath. Gastrointestinal: Endorses abdominal pain.  No nausea, no vomiting.  No diarrhea. Genitourinary: Negative for dysuria. Musculoskeletal: Negative for acute arthralgias Skin: Negative for rash. Neurological: Negative for headaches, weakness/numbness/paresthesias in any extremity Psychiatric: Negative for suicidal ideation/homicidal ideation   ____________________________________________   PHYSICAL EXAM:  VITAL SIGNS: ED Triage Vitals  Enc Vitals Group     BP 06/12/20 0759 114/80     Pulse Rate 06/12/20 0759 81     Resp 06/12/20 0759 16      Temp --      Temp Source 06/12/20 0759 Oral     SpO2 06/12/20 0759 100 %     Weight --      Height 06/12/20 0757 5\' 8"  (1.727 m)     Head Circumference --      Peak Flow --      Pain Score 06/12/20 0757 0     Pain Loc --      Pain Edu? --      Excl. in GC? --    Constitutional: Alert and oriented. Well appearing and in no acute distress. Eyes: Conjunctivae are normal. PERRL. Head: Atraumatic. Nose: No congestion/rhinnorhea. Mouth/Throat: Mucous membranes are moist. Neck: No stridor Cardiovascular: Grossly normal heart sounds.  Good peripheral circulation. Respiratory: Normal respiratory effort.  No retractions. Gastrointestinal: Soft and nontender. No distention. Musculoskeletal: No obvious deformities Neurologic:  Normal speech and language. No gross focal neurologic deficits are appreciated. Skin:  Skin is warm and dry. No rash noted. Psychiatric: Mood and affect are normal. Speech and behavior are normal.  ____________________________________________   LABS (all labs ordered are listed, but only abnormal results are displayed)  Labs Reviewed  URINALYSIS, COMPLETE (UACMP) WITH MICROSCOPIC - Abnormal; Notable for the following components:      Result Value   Color, Urine YELLOW (*)    APPearance HAZY (*)    Hgb urine dipstick SMALL (*)    Bacteria, UA RARE (*)    All other components within normal limits  LIPASE, BLOOD  COMPREHENSIVE METABOLIC PANEL  CBC  POC URINE PREG, ED  POCT PREGNANCY, URINE    PROCEDURES  Procedure(s) performed (including Critical Care):  Procedures   ____________________________________________   INITIAL IMPRESSION / ASSESSMENT AND PLAN / ED COURSE  As part of my medical decision making, I reviewed the following data within the electronic MEDICAL RECORD NUMBER Nursing notes reviewed and incorporated, Labs reviewed, Old chart reviewed, and Notes from prior ED visits reviewed and incorporated        Patients symptoms not typical for  emergent causes of abdominal pain such as, but not limited to, appendicitis, abdominal aortic aneurysm, surgical biliary disease, pancreatitis, SBO, mesenteric ischemia, serious intra-abdominal bacterial illness. Presentation also not typical of gynecologic emergencies such as TOA, Ovarian Torsion, PID. Not Ectopic. Doubt atypical ACS.  Pt tolerating PO. Disposition: Patient will be discharged with strict return precautions and follow up with primary MD within 12-24 hours for further evaluation. Patient understands that this still may have an early presentation of an emergent medical condition such as appendicitis that will require a recheck.      ____________________________________________   FINAL CLINICAL IMPRESSION(S) / ED DIAGNOSES  Final diagnoses:  Abdominal pain, epigastric  Other insomnia     ED Discharge Orders         Ordered    traZODone (DESYREL) 50 MG tablet  Daily at bedtime        06/12/20 1240  Note:  This document was prepared using Dragon voice recognition software and may include unintentional dictation errors.   Merwyn Katos, MD 06/12/20 1314

## 2020-06-12 NOTE — ED Triage Notes (Signed)
Pt comes into the ED via EMS from home with c/o epigastric pain , not sleeping well for the past week. Pt states she has been having a lot of stress and anxiety and was prescribed xanax and zoloft but doesn't seem to be helping.

## 2020-06-12 NOTE — ED Notes (Signed)
Pt ambulated out of ED with steady gait, no distress noted  

## 2020-06-12 NOTE — Discharge Instructions (Addendum)
We recommend Tylenol 1000mg  every 8 hours for headache management. Try and avoid NSAIDs such as Aspirin or Ibuprofen with your history of reflux. Continue follow up with your primary care doctor. You may see a gastroenterologist, if desired, for further evaluation of your reflux symptoms.

## 2020-06-12 NOTE — ED Notes (Addendum)
Pt req food. MD at bedside, will address hungar after exam

## 2020-06-13 ENCOUNTER — Telehealth: Payer: Self-pay | Admitting: Internal Medicine

## 2020-06-13 MED ORDER — PANTOPRAZOLE SODIUM 40 MG PO TBEC: 40.0000 mg | DELAYED_RELEASE_TABLET | Freq: Every day | ORAL | 1 refills | Status: AC

## 2020-06-13 NOTE — Telephone Encounter (Signed)
Office note from 10/1 was completed on same day of visit.Marland KitchenMarland Kitchen

## 2020-06-13 NOTE — Telephone Encounter (Signed)
Pt is calling in stating that she needs for Dr. Ardyth Harps to complete her last office note so that she can get an appointment with [facility] they are stating that it is not completed so they are not able to give her an appointment.

## 2020-06-14 ENCOUNTER — Encounter: Payer: Self-pay | Admitting: Nurse Practitioner

## 2020-06-14 ENCOUNTER — Ambulatory Visit (INDEPENDENT_AMBULATORY_CARE_PROVIDER_SITE_OTHER): Payer: BC Managed Care – PPO | Admitting: Nurse Practitioner

## 2020-06-14 VITALS — BP 110/78 | HR 89 | Ht 68.0 in | Wt 125.8 lb

## 2020-06-14 DIAGNOSIS — J40 Bronchitis, not specified as acute or chronic: Secondary | ICD-10-CM | POA: Diagnosis not present

## 2020-06-14 DIAGNOSIS — R0789 Other chest pain: Secondary | ICD-10-CM | POA: Diagnosis not present

## 2020-06-14 DIAGNOSIS — R079 Chest pain, unspecified: Secondary | ICD-10-CM

## 2020-06-14 NOTE — Progress Notes (Signed)
ASSESSMENT AND PLAN    # Postprandial chest burning / recent solid food dysphagia ( resolved) --Probably GERD causing the postprandial burning --Multiple ED visits for chest pain --Pepcid not helping but not taking it on a consistent. She hasn't started PPI prescribed by previous provider, would rather just wait to find out what is going on.  --Will schedule EGD to evaluate GERD symptoms. The risks and benefits of EGD were discussed and the patient agrees to proceed.   # Mild constipation  --Could be related to recent decreased PO intake. Also, recent medications changes. She has been on several different medications over the last month ( Benadryl, Trazadone, Xanax) . Currently just on Zoloft so hopefully bowel movements will normalize, in the interim start Benefiber Q day   HISTORY OF PRESENT ILLNESS     Primary Gastroenterologist :new - [name], MD  Chief Complaint : chest discomfort after eating.   Donna Leach is a 29 y.o. female with PMH / PSH significant for,  but not necessarily limited to: depression, GAD, appendectomy, former smoker,   Patient has had multiple ED visits for anxiety / chest pain / abdominal pain.  CXR, CT angio of chest unrevealing. Recent labs: preg test, TSH, CMP, CBC, Troponin, lipase all unremarkable. She had been evaluated in ED for tachycardia  Patient comes in with complaints of burning in her chest after eating. It doesn't matter what she eat, the burning can occur even with water. Symptoms started several days ago and says this burning is not the same chest pain for which she was previously seen in the ED.  The burning episodes last a couple of hours after eating. She had a few episodes of solid food dysphagia a month of so ago which has resolved but then she is also trying to stick to soft foods. She doesn't want to eat because of the chest discomfort.  She has been taking Pepcid on and off for a week but it doesn't help. She was  prescribed Omeprazole at one time but never took it. Based on Epic weights she is down about 2 pounds since UC visit on 05/23/20  Patient complains of decreased BMs over the last month. Having a BM every couple of days whereas she used to have a daily BM. No blood in stool.  She has recently had several medication changes.   Previous Endoscopic Evaluations / Pertinent Studies:  none  Past Medical History:  Diagnosis Date  . Anemia   . Anxiety   . Depression      Past Surgical History:  Procedure Laterality Date  . Adnoids    . APPENDECTOMY     Family History  Problem Relation Age of Onset  . Hypertension Father   . Diabetes Father   . Diabetes Mother   . Hyperthyroidism Other   . Esophageal cancer Neg Hx   . Colon cancer Neg Hx   . Liver disease Neg Hx   . Stomach cancer Neg Hx   . Pancreatic cancer Neg Hx    Social History   Tobacco Use  . Smoking status: Former Smoker    Years: 2.00    Quit date: 11/14/2016    Years since quitting: 3.5  . Smokeless tobacco: Never Used  . Tobacco comment: no cigarettes in 3 months  Vaping Use  . Vaping Use: Never used  Substance Use Topics  . Alcohol use: Not Currently  . Drug use: No   Current Outpatient Medications  Medication  Sig Dispense Refill  . Melatonin 10 MG TABS Take 1 tablet by mouth at bedtime for 14 days. 14 tablet 0  . sertraline (ZOLOFT) 50 MG tablet Take 1 tablet (50 mg total) by mouth daily. 90 tablet 1   No current facility-administered medications for this visit.   Allergies  Allergen Reactions  . Penicillins Hives    Has patient had a PCN reaction causing immediate rash, facial/tongue/throat swelling, SOB or lightheadedness with hypotension: Y Has patient had a PCN reaction causing severe rash involving mucus membranes or skin necrosis: Y Has patient had a PCN reaction that required hospitalization: N Has patient had a PCN reaction occurring within the last 10 years: N If all of the above answers are  "NO", then may proceed with Cephalosporin use.  Has patient had a PCN reaction causing immediate rash, facial/tongue/throat swelling, SOB or lightheadedness with hypotension: Y Has patient had a PCN reaction causing severe rash involving mucus membranes or skin necrosis: Y Has patient had a PCN reaction that required hospitalization: N Has patient had a PCN reaction occurring within the last 10 years: N If all of the above answers are "NO", then may proceed with Cephalosporin use. Has patient had a PCN reaction causing immediate rash, facial/tongue/throat swelling, SOB or lightheadedness with hypotension: Y Has patient had a PCN reaction causing severe rash involving mucus membranes or skin necrosis: Y Has patient had a PCN reaction that required hospitalization: N Has patient had a PCN reaction occurring within the last 10 years: N If all of the above answers are "NO", then may proceed with Cephalosporin use. Has patient had a PCN reaction causing immediate rash, facial/tongue/throat swelling, SOB or lightheadedness with hypotension: Y Has patient had a PCN reaction causing severe rash involving mucus membranes or skin necrosis: Y Has patient had a PCN reaction that required hospitalization: N Has patient had a PCN reaction occurring within the last 10 years: N If all of the above answers are "NO", then may proceed with Cephalosporin use. Has patient had a PCN reaction causing immediate rash, facial/tongue/throat swelling, SOB or lightheadedness with hypotension: Y Has patient had a PCN reaction causing severe rash involving mucus membranes or skin necrosis: Y Has patient had a PCN reaction that required hospitalization: N Has patient had a PCN reaction occurring within the last 10 years: N If all of the above answers are "NO", then may proceed with Cephalosporin use. Has patient had a PCN reaction causing immediate rash, facial/tongue/throat swelling, SOB or lightheadedness with hypotension:  Y Has patient had a PCN reaction causing severe rash involving mucus membranes or skin necrosis: Y Has patient had a PCN reaction that required hospitalization: N Has patient had a PCN reaction occurring within the last 10 years: N If all of the above answers are "NO", then may proceed with Cephalosporin use. Has patient had a PCN reaction causing immediate rash, facial/tongue/throat swelling, SOB or lightheadedness with hypotension: Y Has patient had a PCN reaction causing severe rash involvi... (TRUNCATED) Has patient had a PCN reaction causing immediate rash, facial/tongue/throat swelling, SOB or lightheadedness with hypotension: Y Has patient had a PCN reaction causing severe rash involving mucus membranes or skin necrosis: Y Has patient had a PCN reaction that required hospitalization: N Has patient had a PCN reaction occurring within the last 10 years: N If all of the above answers are "NO", then may proceed with Cephalosporin use. Has patient had a PCN reaction causing immediate rash, facial/tongue/throat swelling, SOB or lightheadedness with hypotension:  Y Has patient had a PCN reaction causing severe rash involving mucus membranes or skin necrosis: Y Has patient had a PCN reaction that required hospitalization: N Has patient had a PCN reaction occurring within the last 10 years: N If all of the above answers are "NO", then may proceed with Cephalosporin use. Has patient had a PCN reaction causing immediate rash, facial/tongue/throat swelling, SOB or lightheadedness with hypotension: Y Has patient had a PCN reaction causing severe rash involving mucus membranes or skin necrosis: Y Has patient had a PCN reaction that required hospitalization: N Has patient had a PCN reaction occurring within the last 10 years: N If all of the above answers are "NO", then may proceed with Cephalosporin use. Has patient had a PCN reaction causing immediate rash, facial/tongue/throat swelling, SOB or  lightheadedness with hypotension: Y Has patient had a PCN reaction causing severe rash involvi... (TRUNCATED)   . Amoxicillin Hives  . Cefixime Hives     Review of Systems: Positive for blood in urine, fatigue, headaches, heart rhythm changes, shortness of breath, sleeping problems.   All other systems reviewed and negative except where noted in HPI.   PHYSICAL EXAM :    Wt Readings from Last 3 Encounters:  06/14/20 125 lb 12.8 oz (57.1 kg)  06/08/20 127 lb 6.4 oz (57.8 kg)  06/07/20 126 lb (57.2 kg)    BP 110/78   Pulse 89   Ht 5\' 8"  (1.727 m)   Wt 125 lb 12.8 oz (57.1 kg)   SpO2 99%   BMI 19.13 kg/m  Constitutional:  Pleasant thin female in no acute distress. Psychiatric: Normal mood and affect. Behavior is normal. EENT: Pupils normal.  Conjunctivae are normal. No scleral icterus. Neck supple.  Cardiovascular: Normal rate, regular rhythm. No edema Pulmonary/chest: Effort normal and breath sounds normal. No wheezing, rales or rhonchi. Abdominal: Soft, nondistended, nontender. Bowel sounds active throughout. There are no masses palpable. No hepatomegaly. Neurological: Alert and oriented to person place and time. Skin: Skin is warm and dry. No rashes noted.  , NP  06/14/2020, 1:56 PM  Cc:  08/14/2020, [place]*

## 2020-06-14 NOTE — Patient Instructions (Signed)
Pepcid 20 mg daily.  Start Benefiber 2 teaspoons in 8 ounces of liquid daily.  You have been scheduled for an endoscopy. Please follow written instructions given to you at your visit today. If you use inhalers (even only as needed), please bring them with you on the day of your procedure.  Due to recent changes in healthcare laws, you may see the results of your imaging and laboratory studies on MyChart before your provider has had a chance to review them.  We understand that in some cases there may be results that are confusing or concerning to you. Not all laboratory results come back in the same time frame and the provider may be waiting for multiple results in order to interpret others.  Please give [place] 48 hours in order for your provider to thoroughly review all the results before contacting the office for clarification of your results.

## 2020-06-15 ENCOUNTER — Ambulatory Visit (INDEPENDENT_AMBULATORY_CARE_PROVIDER_SITE_OTHER)
Admission: EM | Admit: 2020-06-15 | Discharge: 2020-06-15 | Disposition: A | Discharge status: Home / Self Care | Payer: BC Managed Care – PPO | Source: Home / Self Care

## 2020-06-15 ENCOUNTER — Other Ambulatory Visit: Payer: Self-pay

## 2020-06-15 ENCOUNTER — Emergency Department (HOSPITAL_COMMUNITY)
Admission: EM | Admit: 2020-06-15 | Discharge: 2020-06-16 | Disposition: A | Discharge status: Home / Self Care | Payer: BC Managed Care – PPO | Attending: Emergency Medicine | Admitting: Emergency Medicine

## 2020-06-15 ENCOUNTER — Encounter (HOSPITAL_COMMUNITY): Payer: Self-pay

## 2020-06-15 DIAGNOSIS — R072 Precordial pain: Secondary | ICD-10-CM

## 2020-06-15 DIAGNOSIS — R079 Chest pain, unspecified: Secondary | ICD-10-CM | POA: Diagnosis not present

## 2020-06-15 DIAGNOSIS — R911 Solitary pulmonary nodule: Secondary | ICD-10-CM | POA: Diagnosis not present

## 2020-06-15 DIAGNOSIS — Z87891 Personal history of nicotine dependence: Secondary | ICD-10-CM | POA: Insufficient documentation

## 2020-06-15 DIAGNOSIS — R5383 Other fatigue: Secondary | ICD-10-CM | POA: Insufficient documentation

## 2020-06-15 LAB — COMPREHENSIVE METABOLIC PANEL
ALT: 11 U/L (ref 0–44)
ALT: 10 U/L (ref 0–44)
AST: 17 U/L (ref 15–41)
AST: 14 U/L — ABNORMAL LOW (ref 15–41)
Albumin: 4.4 g/dL (ref 3.5–5.0)
Albumin: 4.3 g/dL (ref 3.5–5.0)
Alkaline Phosphatase: 36 U/L — ABNORMAL LOW (ref 38–126)
Alkaline Phosphatase: 35 U/L — ABNORMAL LOW (ref 38–126)
Anion gap: 13 (ref 5–15)
Anion gap: 12 (ref 5–15)
BUN: 15 mg/dL (ref 6–20)
BUN: 15 mg/dL (ref 6–20)
CO2: 22 mmol/L (ref 22–32)
CO2: 23 mmol/L (ref 22–32)
Calcium: 9.6 mg/dL (ref 8.9–10.3)
Calcium: 9.4 mg/dL (ref 8.9–10.3)
Chloride: 102 mmol/L (ref 98–111)
Chloride: 102 mmol/L (ref 98–111)
Creatinine, Ser: 0.9 mg/dL (ref 0.44–1.00)
Creatinine, Ser: 0.94 mg/dL (ref 0.44–1.00)
GFR, Estimated: >60 mL/min (ref >60)
GFR, Estimated: >60 mL/min (ref >60)
Glucose, Bld: 97 mg/dL (ref 70–99)
Glucose, Bld: 105 mg/dL — ABNORMAL HIGH (ref 70–99)
Potassium: 4.1 mmol/L (ref 3.5–5.1)
Potassium: 4 mmol/L (ref 3.5–5.1)
Sodium: 137 mmol/L (ref 135–145)
Sodium: 137 mmol/L (ref 135–145)
Total Bilirubin: 1.4 mg/dL — ABNORMAL HIGH (ref 0.3–1.2)
Total Bilirubin: 1.1 mg/dL (ref 0.3–1.2)
Total Protein: 7.3 g/dL (ref 6.5–8.1)
Total Protein: 7 g/dL (ref 6.5–8.1)

## 2020-06-15 LAB — CBC WITH DIFFERENTIAL/PLATELET
Abs Immature Granulocytes: 0.02 10*3/uL (ref 0.00–0.07)
Basophils Absolute: 0 10*3/uL (ref 0.0–0.1)
Basophils Relative: 1 %
Eosinophils Absolute: 0.1 10*3/uL (ref 0.0–0.5)
Eosinophils Relative: 1 %
HCT: 40.8 % (ref 36.0–46.0)
Hemoglobin: 14.5 g/dL (ref 12.0–15.0)
Immature Granulocytes: 0 %
Lymphocytes Relative: 23 %
Lymphs Abs: 1.6 10*3/uL (ref 0.7–4.0)
MCH: 33.1 pg (ref 26.0–34.0)
MCHC: 35.5 g/dL (ref 30.0–36.0)
MCV: 93.2 fL (ref 80.0–100.0)
Monocytes Absolute: 0.5 10*3/uL (ref 0.1–1.0)
Monocytes Relative: 7 %
Neutro Abs: 5 10*3/uL (ref 1.7–7.7)
Neutrophils Relative %: 68 %
Platelets: 296 10*3/uL (ref 150–400)
RBC: 4.38 MIL/uL (ref 3.87–5.11)
RDW: 11.8 % (ref 11.5–15.5)
WBC: 7.3 10*3/uL (ref 4.0–10.5)
nRBC: 0 % (ref 0.0–0.2)

## 2020-06-15 LAB — TROPONIN I (HIGH SENSITIVITY): Troponin I (High Sensitivity): <2 ng/L (ref <18)

## 2020-06-15 NOTE — Discharge Instructions (Signed)
Ask your GI specialist about testing for celiacs disease and lead poisoning.  Follow up on Monday with the specialist as planned I checking some basic labs and for Lyme disease.  Stay hydrated and avoid gluten for now to see if that helps.

## 2020-06-15 NOTE — ED Triage Notes (Addendum)
Pt present fatigue and a sensation when she eats. Pt states that when she eats she get pale and chills. Pt tested positive for covid 19 08/30.

## 2020-06-15 NOTE — ED Triage Notes (Signed)
Pt arrives to ED w/ c/o 4/10 left sided non-radiating chest pain that started approx 2 hours ago. Pt denies n/v, diaphoresis, sob.

## 2020-06-15 NOTE — ED Notes (Addendum)
Pt prefers to not have CXR at this time and states she had cbc and bmet drawn at [facility] so prefers to not have them redone here. Pt refusing Hcg blood test as well.

## 2020-06-16 MED ORDER — ALUM & MAG HYDROXIDE-SIMETH 200-200-20 MG/5ML PO SUSP
30.0000 mL | Freq: Once | ORAL | Status: AC
  Administered 2020-06-16: 30 mL via ORAL
  Filled 2020-06-16: qty 30

## 2020-06-16 NOTE — ED Provider Notes (Signed)
[facility] EMERGENCY DEPARTMENT Provider Note   CSN: [id number] Arrival date & time: 06/15/20  1815     History Chief Complaint  Patient presents with  . Chest Pain    Donna Leach is a 29 y.o. female.  The history is provided by the patient.  Chest Pain Pain quality: aching   Pain radiates to:  Does not radiate Pain severity:  Moderate Onset quality:  Sudden Progression:  Improving Chronicity:  New Context: eating   Relieved by:  Nothing Exacerbated by: Eating. Associated symptoms: nausea and shortness of breath   Associated symptoms: no abdominal pain, no fever and no vomiting   Patient with history of anemia, anxiety presents with chest pain.  She reports she ate a large meal and then began having chest pain.  She also reports she started feeling lightheaded after eating.  She has had this happen previously and because of that she has not been eating a lot of food.  She reports up to 11 pound weight loss in the past month.  She reports she is able to eat and drink small amounts but nothing more.  Her pain is now improved.  No abdominal pain.  No fevers or vomiting.  She does report nausea.  She also feels that her heart rate is increasing.  Patient's had multiple ED visits recently.  She is scheduled to have an endoscopy next week by her gastroenterologist.     Past Medical History:  Diagnosis Date  . Anemia   . Anxiety   . Depression     Patient Active Problem List   Diagnosis Date Noted  . Major depressive disorder, recurrent episode, moderate (HCC)   . Generalized anxiety disorder   . Thyroiditis 11/04/2018  . Traumatic hematoma of finger 11/06/2016  . Depression 04/08/2016    Past Surgical History:  Procedure Laterality Date  . Adnoids    . APPENDECTOMY       OB History    Gravida  0   Para      Term      Preterm      AB      Living        SAB      TAB      Ectopic      Multiple      Live Births               Family History  Problem Relation Age of Onset  . Hypertension Father   . Diabetes Father   . Diabetes Mother   . Hyperthyroidism Other   . Esophageal cancer Neg Hx   . Colon cancer Neg Hx   . Liver disease Neg Hx   . Stomach cancer Neg Hx   . Pancreatic cancer Neg Hx     Social History   Tobacco Use  . Smoking status: Former Smoker    Years: 2.00    Quit date: 11/14/2016    Years since quitting: 3.5  . Smokeless tobacco: Never Used  . Tobacco comment: no cigarettes in 3 months  Vaping Use  . Vaping Use: Never used  Substance Use Topics  . Alcohol use: Not Currently  . Drug use: No    Home Medications Prior to Admission medications   Medication Sig Start Date End Date Taking? Authorizing Provider  ALPRAZolam (XANAX) 0.25 MG tablet Take 1 tablet (0.25 mg total) by mouth 2 (two) times daily as needed for anxiety. Patient not taking: Reported on 06/14/2020  06/08/20   Philip Aspen, Limmie Patricia, MD  hydrOXYzine (ATARAX/VISTARIL) 25 MG tablet Take 1 tablet (25 mg total) by mouth every 6 (six) hours as needed for up to 14 days for anxiety. Patient not taking: Reported on 06/14/2020 06/07/20 06/21/20  Gailen Shelter, PA  Melatonin 10 MG TABS Take 1 tablet by mouth at bedtime for 14 days. 06/07/20 06/21/20  Gailen Shelter, PA  pantoprazole (PROTONIX) 40 MG tablet Take 1 tablet (40 mg total) by mouth daily. Patient not taking: Reported on 06/14/2020 06/13/20   Philip Aspen, Limmie Patricia, MD  potassium chloride SA (KLOR-CON) 20 MEQ tablet Take 1 tablet (20 mEq total) by mouth 2 (two) times daily. Patient not taking: Reported on 06/14/2020 06/06/20   Dione Booze, MD  sertraline (ZOLOFT) 50 MG tablet Take 1 tablet (50 mg total) by mouth daily. 06/08/20   Philip Aspen, Limmie Patricia, MD  sucralfate (CARAFATE) 1 g tablet Take 1 tablet (1 g total) by mouth 4 (four) times daily -  with meals and at bedtime for 14 days. Patient not taking: Reported on 06/14/2020 06/07/20 06/21/20  Gailen Shelter, PA  traZODone (DESYREL) 50 MG tablet Take 1 tablet (50 mg total) by mouth at bedtime. Patient not taking: Reported on 06/14/2020 06/12/20   Merwyn Katos, MD    Allergies    Penicillins, Amoxicillin, and Cefixime  Review of Systems   Review of Systems  Constitutional: Negative for fever.  Respiratory: Positive for shortness of breath.   Cardiovascular: Positive for chest pain.  Gastrointestinal: Positive for nausea. Negative for abdominal pain, blood in stool and vomiting.  Neurological: Positive for light-headedness. Negative for syncope.  All other systems reviewed and are negative.   Physical Exam Updated Vital Signs BP 107/75   Pulse (!) 59   Temp 98.7 F (37.1 C) (Oral)   Resp 14   SpO2 100%   Physical Exam CONSTITUTIONAL: Well developed/well nourished HEAD: Normocephalic/atraumatic EYES: EOMI/PERRL ENMT: Mucous membranes moist NECK: supple no meningeal signs SPINE/BACK:entire spine nontender CV: S1/S2 noted, no murmurs/rubs/gallops noted LUNGS: Lungs are clear to auscultation bilaterally, no apparent distress ABDOMEN: soft, nontender, no rebound or guarding, bowel sounds noted throughout abdomen GU:no cva tenderness NEURO: Pt is awake/alert/appropriate, moves all extremitiesx4.  No facial droop.  No arm or leg drift EXTREMITIES: pulses normal/equalx4, full ROM SKIN: warm, color normal PSYCH: no abnormalities of mood noted, alert and oriented to situation  ED Results / Procedures / Treatments   Labs (all labs ordered are listed, but only abnormal results are displayed) Labs Reviewed  COMPREHENSIVE METABOLIC PANEL - Abnormal; Notable for the following components:      Result Value   Glucose, Bld 105 (*)    AST 14 (*)    Alkaline Phosphatase 35 (*)    All other components within normal limits  TROPONIN I (HIGH SENSITIVITY)  TROPONIN I (HIGH SENSITIVITY)    EKG EKG Interpretation  Date/Time:  Friday June 15 2020 18:25:28 EDT Ventricular  Rate:  90 PR Interval:  140 QRS Duration: 74 QT Interval:  340 QTC Calculation: 415 R Axis:   70 Text Interpretation: Normal sinus rhythm Cannot rule out Anterior infarct , age undetermined Abnormal ECG Confirmed by Zadie Rhine ([id number]) on 06/16/2020 1:43:28 AM   Radiology No results found.  Procedures Procedures  Medications Ordered in ED Medications  alum & mag hydroxide-simeth (MAALOX/MYLANTA) 200-200-20 MG/5ML suspension 30 mL (30 mLs Oral Given 06/16/20 0424)    ED Course  I have reviewed the  triage vital signs and the nursing notes.  Pertinent labs  results that were available during my care of the patient were reviewed by me and considered in my medical decision making (see chart for details).    MDM Rules/Calculators/A&P                          3:38 AM EKG unchanged.  No change in orthostatic vital signs.  Labs reassuring. Patient had extensive evaluation recently.  She declines further work-up at this time.  Will give p.o. challenge and reassess I strongly urged patient to follow-up with gastroenterology 4:52 AM Patient stable in the ED. No hypoxia. No tachycardia. She is resting comfortably on reassessment. She did have some chest pressure after eating, but no vomiting. She was given Maalox with improvement I feel she is safe for discharge home. She has had extensive evaluation in the past. I encouraged her to follow-up with gastroenterology next week. On review of chart, she was found to have a pulmonary nodule and recent CT angio chest. She is a previous smoker. I advised her to have a CAT scan of her chest in the next year to ensure the nodule has not progressed to cancer. Patient agrees with this plan  Final Clinical Impression(s) / ED Diagnoses Final diagnoses:  Lung nodule  Precordial pain    Rx / DC Orders ED Discharge Orders    None       Zadie Rhine, MD 06/16/20 [phone]

## 2020-06-16 NOTE — Discharge Instructions (Addendum)
As we discussed, you will need to have a CAT scan of your chest within the next year to make sure the lung nodule has not progressed to cancer

## 2020-06-16 NOTE — ED Notes (Signed)
After half of sandwich pt experiencing chest pain and pressure. States it "feels like I can breath"

## 2020-06-17 ENCOUNTER — Emergency Department (HOSPITAL_COMMUNITY)
Admission: EM | Admit: 2020-06-17 | Discharge: 2020-06-18 | Disposition: A | Discharge status: Home / Self Care | Payer: BC Managed Care – PPO | Attending: Emergency Medicine | Admitting: Emergency Medicine

## 2020-06-17 DIAGNOSIS — Z87891 Personal history of nicotine dependence: Secondary | ICD-10-CM | POA: Insufficient documentation

## 2020-06-17 DIAGNOSIS — R55 Syncope and collapse: Secondary | ICD-10-CM | POA: Diagnosis not present

## 2020-06-17 DIAGNOSIS — R42 Dizziness and giddiness: Secondary | ICD-10-CM | POA: Insufficient documentation

## 2020-06-17 DIAGNOSIS — R079 Chest pain, unspecified: Secondary | ICD-10-CM | POA: Diagnosis not present

## 2020-06-17 DIAGNOSIS — R0789 Other chest pain: Secondary | ICD-10-CM | POA: Diagnosis not present

## 2020-06-17 LAB — I-STAT VENOUS BLOOD GAS, ED
Acid-Base Excess: 1 mmol/L (ref 0.0–2.0)
Bicarbonate: 26.3 mmol/L (ref 20.0–28.0)
Calcium, Ion: 1.2 mmol/L (ref 1.15–1.40)
HCT: 40 % (ref 36.0–46.0)
Hemoglobin: 13.6 g/dL (ref 12.0–15.0)
O2 Saturation: 93 %
Potassium: 3.8 mmol/L (ref 3.5–5.1)
Sodium: 139 mmol/L (ref 135–145)
TCO2: 28 mmol/L (ref 22–32)
pCO2, Ven: 45.7 mmHg (ref 44.0–60.0)
pH, Ven: 7.368 (ref 7.250–7.430)
pO2, Ven: 70 mmHg — ABNORMAL HIGH (ref 32.0–45.0)

## 2020-06-17 LAB — CBC
HCT: 40.9 % (ref 36.0–46.0)
Hemoglobin: 14.2 g/dL (ref 12.0–15.0)
MCH: 33.4 pg (ref 26.0–34.0)
MCHC: 34.7 g/dL (ref 30.0–36.0)
MCV: 96.2 fL (ref 80.0–100.0)
Platelets: 257 10*3/uL (ref 150–400)
RBC: 4.25 MIL/uL (ref 3.87–5.11)
RDW: 11.9 % (ref 11.5–15.5)
WBC: 8.5 10*3/uL (ref 4.0–10.5)
nRBC: 0 % (ref 0.0–0.2)

## 2020-06-17 LAB — BASIC METABOLIC PANEL
Anion gap: 10 (ref 5–15)
BUN: 15 mg/dL (ref 6–20)
CO2: 25 mmol/L (ref 22–32)
Calcium: 9.6 mg/dL (ref 8.9–10.3)
Chloride: 103 mmol/L (ref 98–111)
Creatinine, Ser: 0.82 mg/dL (ref 0.44–1.00)
GFR, Estimated: >60 mL/min (ref >60)
Glucose, Bld: 93 mg/dL (ref 70–99)
Potassium: 3.8 mmol/L (ref 3.5–5.1)
Sodium: 138 mmol/L (ref 135–145)

## 2020-06-17 NOTE — ED Triage Notes (Signed)
To triage via EMS.  Onset tonight dizziness andand chest pressure tightness.  Pt took omeprazole and pepcid with no relief.  Pt states the difference in tonight is that it discomfort has lasted 3 hours.  Is scheduled for endoscopy in the morning.

## 2020-06-18 LAB — TROPONIN I (HIGH SENSITIVITY): Troponin I (High Sensitivity): <2 ng/L (ref <18)

## 2020-06-18 LAB — URINALYSIS, ROUTINE W REFLEX MICROSCOPIC
Bilirubin Urine: NEGATIVE
Glucose, UA: NEGATIVE mg/dL
Ketones, ur: NEGATIVE mg/dL
Nitrite: NEGATIVE
Protein, ur: NEGATIVE mg/dL
Specific Gravity, Urine: 1.013 (ref 1.005–1.030)
pH: 5 (ref 5.0–8.0)

## 2020-06-18 LAB — B. BURGDORFI ANTIBODIES: B burgdorferi Ab IgG+IgM: <0.91 {ISR} (ref 0.00–0.90)

## 2020-06-18 LAB — CBG MONITORING, ED: Glucose-Capillary: 127 mg/dL — ABNORMAL HIGH (ref 70–99)

## 2020-06-18 NOTE — Addendum Note (Signed)
Addended by: Johnella Moloney on: 06/18/2020 11:25 AM   Modules accepted: Orders

## 2020-06-18 NOTE — ED Provider Notes (Signed)
[facility]    CSN: [id number] Arrival date & time: 06/15/20  1520      History   Chief Complaint Chief Complaint  Patient presents with  . Fatigue    HPI Donna Leach is a 29 y.o. female.   Patient is a 29 year old female past medical history of anemia, anxiety, depression.  She presents today with chronic issues.  She has been having generalized fatigue, loss of appetite, weight loss.  Reporting that every time she eats she gets pale and has chills.  She also reports that her heart races after she eats.  Patient tested positive for Covid on August 29.  These symptoms have been present off and on for many years but worsening over the past few months.  She has been seen by multiple specialists to include cardiologist, gastroenterologist and her primary care physician.  She has had unremarkable lab work.  Did have pulmonary nodule noted on chest x-ray and was recommended CT follow-up.  Currently taking Zoloft for depression and anxiety.      Past Medical History:  Diagnosis Date  . Anemia   . Anxiety   . Depression     Patient Active Problem List   Diagnosis Date Noted  . Major depressive disorder, recurrent episode, moderate (HCC)   . Generalized anxiety disorder   . Thyroiditis 11/04/2018  . Traumatic hematoma of finger 11/06/2016  . Depression 04/08/2016    Past Surgical History:  Procedure Laterality Date  . Adnoids    . APPENDECTOMY      OB History    Gravida  0   Para      Term      Preterm      AB      Living        SAB      TAB      Ectopic      Multiple      Live Births               Home Medications    Prior to Admission medications   Medication Sig Start Date End Date Taking? Authorizing Provider  ALPRAZolam (XANAX) 0.25 MG tablet Take 1 tablet (0.25 mg total) by mouth 2 (two) times daily as needed for anxiety. Patient not taking: Reported on 06/14/2020 06/08/20   Philip Aspen, Limmie Patricia, MD  hydrOXYzine  (ATARAX/VISTARIL) 25 MG tablet Take 1 tablet (25 mg total) by mouth every 6 (six) hours as needed for up to 14 days for anxiety. Patient not taking: Reported on 06/14/2020 06/07/20 06/21/20  Gailen Shelter, PA  Melatonin 10 MG TABS Take 1 tablet by mouth at bedtime for 14 days. 06/07/20 06/21/20  Gailen Shelter, PA  pantoprazole (PROTONIX) 40 MG tablet Take 1 tablet (40 mg total) by mouth daily. Patient not taking: Reported on 06/14/2020 06/13/20   Philip Aspen, Limmie Patricia, MD  potassium chloride SA (KLOR-CON) 20 MEQ tablet Take 1 tablet (20 mEq total) by mouth 2 (two) times daily. Patient not taking: Reported on 06/14/2020 06/06/20   Dione Booze, MD  sertraline (ZOLOFT) 50 MG tablet Take 1 tablet (50 mg total) by mouth daily. 06/08/20   Philip Aspen, Limmie Patricia, MD  sucralfate (CARAFATE) 1 g tablet Take 1 tablet (1 g total) by mouth 4 (four) times daily -  with meals and at bedtime for 14 days. Patient not taking: Reported on 06/14/2020 06/07/20 06/21/20  Gailen Shelter, PA  traZODone (DESYREL) 50 MG tablet Take 1  tablet (50 mg total) by mouth at bedtime. Patient not taking: Reported on 06/14/2020 06/12/20   Merwyn Katos, MD    Family History Family History  Problem Relation Age of Onset  . Hypertension Father   . Diabetes Father   . Diabetes Mother   . Hyperthyroidism Other   . Esophageal cancer Neg Hx   . Colon cancer Neg Hx   . Liver disease Neg Hx   . Stomach cancer Neg Hx   . Pancreatic cancer Neg Hx     Social History Social History   Tobacco Use  . Smoking status: Former Smoker    Years: 2.00    Quit date: 11/14/2016    Years since quitting: 3.5  . Smokeless tobacco: Never Used  . Tobacco comment: no cigarettes in 3 months  Vaping Use  . Vaping Use: Never used  Substance Use Topics  . Alcohol use: Not Currently  . Drug use: No     Allergies   Penicillins, Amoxicillin, and Cefixime   Review of Systems Review of Systems   Physical Exam Triage Vital  Signs ED Triage Vitals  Enc Vitals Group     BP 06/15/20 1615 113/80     Pulse Rate 06/15/20 1615 98     Resp 06/15/20 1615 18     Temp 06/15/20 1615 98.2 F (36.8 C)     Temp Source 06/15/20 1615 Oral     SpO2 06/15/20 1615 100 %     Weight --      Height --      Head Circumference --      Peak Flow --      Pain Score 06/15/20 1617 0     Pain Loc --      Pain Edu? --      Excl. in [facility]? --    No data found.  Updated Vital Signs BP 113/80 (BP Location: Left Arm)   Pulse (S) 83 Comment: Pt did not want to leave because her heart was racing .  HR 83 . Heart beat regular per auscultation  Temp 98.2 F (36.8 C) (Oral)   Resp 18   SpO2 100%   Visual Acuity Right Eye Distance:   Left Eye Distance:   Bilateral Distance:    Right Eye Near:   Left Eye Near:    Bilateral Near:     Physical Exam Vitals and nursing note reviewed.  Constitutional:      General: She is not in acute distress.    Appearance: Normal appearance. She is underweight. She is not ill-appearing, toxic-appearing or diaphoretic.  HENT:     Head: Normocephalic.     Nose: Nose normal.  Eyes:     Conjunctiva/sclera: Conjunctivae normal.  Pulmonary:     Effort: Pulmonary effort is normal.  Musculoskeletal:        General: Normal range of motion.     Cervical back: Normal range of motion.  Skin:    General: Skin is warm and dry.     Findings: No rash.  Neurological:     Mental Status: She is alert.  Psychiatric:        Mood and Affect: Mood normal.     Comments: Tearful during entire encounter       UC Treatments / Results  Labs (all labs ordered are listed, but only abnormal results are displayed) Labs Reviewed  COMPREHENSIVE METABOLIC PANEL - Abnormal; Notable for the following components:      Result Value  Alkaline Phosphatase 36 (*)    Total Bilirubin 1.4 (*)    All other components within normal limits  CBC WITH DIFFERENTIAL/PLATELET  B. BURGDORFI ANTIBODIES     EKG   Radiology No results found.  Procedures Procedures (including critical care time)  Medications Ordered in UC Medications - No data to display  Initial Impression / Assessment and Plan / UC Course  I have reviewed the triage vital signs and the nursing notes.  Pertinent labs & imaging results that were available during my care of the patient were reviewed by me and considered in my medical decision making (see chart for details).     Fatigue Patient with multiple chronic symptoms that have been ongoing for many years and worsening over the past couple months with fatigue, weight loss, tachycardia, numbness, tingling in extremities.  She has been followed up by specialists to include gastroenterologist, cardiology and primary care provider.  She has appointment to see her GI specialist Monday.  Currently taking PPI  Spoke with pt for extended period of time.  Will recheck basic labs and check for Lyme disease  based on unusual prolonged symptoms. Also recommended her GI specialist check her for celiac disease.   Stay hydrated and avoid gluten for now.  See primary care for further issues.  Final Clinical Impressions(s) / UC Diagnoses   Final diagnoses:  Fatigue, unspecified type     Discharge Instructions     Ask your GI specialist about testing for celiacs disease and lead poisoning.  Follow up on Monday with the specialist as planned I checking some basic labs and for Lyme disease.  Stay hydrated and avoid gluten for now to see if that helps.      ED Prescriptions    None     PDMP not reviewed this encounter.   Janace Aris, NP 06/18/20 [phone]

## 2020-06-18 NOTE — ED Notes (Signed)
Pt refusing to be stuck for intial troponin level. Pt states she feels "funny" PA notified

## 2020-06-18 NOTE — ED Notes (Signed)
Patient verbalizes understanding of discharge instructions. Opportunity for questioning and answers were provided. Armband removed by staff, pt discharged from ED to home via POV. Counseled pt about plan for her to call GI to follow-up missed EGD. Pt is tearful, expressed frustration with PCP and navigating care system; attention drawn to number included in dc paperwork to find new PCP if she wishes to do this.

## 2020-06-18 NOTE — Progress Notes (Signed)
Agree with the assessment and plan as outlined by Paula Guenther, NP. ° °Eldar Robitaille, DO, FACG ° °

## 2020-06-18 NOTE — ED Provider Notes (Signed)
[facility] EMERGENCY DEPARTMENT Provider Note   CSN: [id number] Arrival date & time: 06/17/20  2056     History No chief complaint on file.   Donna Leach is a 29 y.o. female.  HPI   Patient with significant medical history of anxiety presents to the emergency department chief complaint of chest pain.  Patient states pain started yesterday after she ate a large meal.  Patient states the chest pain lasted for about 3 hours and then went away on its own.  She denies associated symptoms like becoming short of breath, becoming diaphoretic, radiating pain, paresthesias or weakness.  She does endorse she felt lightheaded and had to lay down.  She states the pain was a squeezing like pressure on the center of her chest.  She has had this in the past and has gone away on its own.  She has been seen by Dr. Mellody Drown of cardiology who performed echocardiogram which did not show any acute abnormalities, she had a Holter monitor placed on which revealed seldom PVCs without any other arrhythmias.  She is also been seen by gastroenterology Charyl Dancer NP who feels patient's chest burning/pain is secondary to GERD.  They scheduled her for a EGD for today but patient will have to miss it due to being here patient is currently on Pepcid and PPIs.  Patient denies headache, fever, chills, shortness of breath, abdominal pain, nausea, vomiting, diarrhea, pedal edema.  Past Medical History:  Diagnosis Date  . Anemia   . Anxiety   . Depression     Patient Active Problem List   Diagnosis Date Noted  . Major depressive disorder, recurrent episode, moderate (HCC)   . Generalized anxiety disorder   . Thyroiditis 11/04/2018  . Traumatic hematoma of finger 11/06/2016  . Depression 04/08/2016    Past Surgical History:  Procedure Laterality Date  . Adnoids    . APPENDECTOMY       OB History    Gravida  0   Para      Term      Preterm      AB      Living         SAB      TAB      Ectopic      Multiple      Live Births              Family History  Problem Relation Age of Onset  . Hypertension Father   . Diabetes Father   . Diabetes Mother   . Hyperthyroidism Other   . Esophageal cancer Neg Hx   . Colon cancer Neg Hx   . Liver disease Neg Hx   . Stomach cancer Neg Hx   . Pancreatic cancer Neg Hx     Social History   Tobacco Use  . Smoking status: Former Smoker    Years: 2.00    Quit date: 11/14/2016    Years since quitting: 3.5  . Smokeless tobacco: Never Used  . Tobacco comment: no cigarettes in 3 months  Vaping Use  . Vaping Use: Never used  Substance Use Topics  . Alcohol use: Not Currently  . Drug use: No    Home Medications Prior to Admission medications   Medication Sig Start Date End Date Taking? Authorizing Provider  ALPRAZolam (XANAX) 0.25 MG tablet Take 1 tablet (0.25 mg total) by mouth 2 (two) times daily as needed for anxiety. Patient not taking: Reported on 06/14/2020  06/08/20   Philip AspenHernandez Acosta, Limmie PatriciaEstela Y, MD  hydrOXYzine (ATARAX/VISTARIL) 25 MG tablet Take 1 tablet (25 mg total) by mouth every 6 (six) hours as needed for up to 14 days for anxiety. Patient not taking: Reported on 06/14/2020 06/07/20 06/21/20  Gailen ShelterFondaw, Wylder S, PA  Melatonin 10 MG TABS Take 1 tablet by mouth at bedtime for 14 days. 06/07/20 06/21/20  Gailen ShelterFondaw, Wylder S, PA  pantoprazole (PROTONIX) 40 MG tablet Take 1 tablet (40 mg total) by mouth daily. Patient not taking: Reported on 06/14/2020 06/13/20   Philip AspenHernandez Acosta, Limmie PatriciaEstela Y, MD  potassium chloride SA (KLOR-CON) 20 MEQ tablet Take 1 tablet (20 mEq total) by mouth 2 (two) times daily. Patient not taking: Reported on 06/14/2020 06/06/20   Dione BoozeGlick, David, MD  sertraline (ZOLOFT) 50 MG tablet Take 1 tablet (50 mg total) by mouth daily. 06/08/20   Philip AspenHernandez Acosta, Limmie PatriciaEstela Y, MD  sucralfate (CARAFATE) 1 g tablet Take 1 tablet (1 g total) by mouth 4 (four) times daily -  with meals and at bedtime  for 14 days. Patient not taking: Reported on 06/14/2020 06/07/20 06/21/20  Gailen ShelterFondaw, Wylder S, PA  traZODone (DESYREL) 50 MG tablet Take 1 tablet (50 mg total) by mouth at bedtime. Patient not taking: Reported on 06/14/2020 06/12/20   Merwyn KatosBradler, Evan K, MD    Allergies    Penicillins, Amoxicillin, and Cefixime  Review of Systems   Review of Systems  Constitutional: Positive for appetite change. Negative for chills and fever.  HENT: Negative for congestion, sore throat, tinnitus and trouble swallowing.   Eyes: Negative for visual disturbance.  Respiratory: Negative for cough and shortness of breath.   Cardiovascular: Positive for chest pain.  Gastrointestinal: Negative for abdominal pain, diarrhea, nausea and vomiting.  Genitourinary: Negative for dysuria, enuresis, flank pain and frequency.  Musculoskeletal: Negative for back pain.  Skin: Negative for rash.  Neurological: Negative for dizziness and headaches.  Hematological: Does not bruise/bleed easily.    Physical Exam Updated Vital Signs BP 102/65   Pulse 63   Temp 98.1 F (36.7 C) (Oral)   Resp 17   SpO2 100%   Physical Exam Vitals and nursing note reviewed.  Constitutional:      General: She is not in acute distress.    Appearance: She is not ill-appearing.  HENT:     Head: Normocephalic and atraumatic.     Nose: No congestion.     Mouth/Throat:     Mouth: Mucous membranes are moist.     Pharynx: Oropharynx is clear.  Eyes:     General: No scleral icterus. Cardiovascular:     Rate and Rhythm: Normal rate and regular rhythm.     Pulses: Normal pulses.     Heart sounds: No murmur heard.  No friction rub. No gallop.   Pulmonary:     Effort: No respiratory distress.     Breath sounds: No wheezing, rhonchi or rales.  Abdominal:     General: There is no distension.     Palpations: Abdomen is soft.     Tenderness: There is abdominal tenderness. There is no right CVA tenderness, left CVA tenderness or guarding.      Comments: Patient abdomen was visualized, nondistended, normoactive bowel sounds, slight tenderness to palpation in the epigastric region, no acute abdomen noted, no peritoneal sign present.  Musculoskeletal:        General: No swelling.     Right lower leg: No edema.     Left lower leg: No edema.  Skin:    General: Skin is warm and dry.     Capillary Refill: Capillary refill takes less than 2 seconds.     Findings: No rash.  Neurological:     Mental Status: She is alert.  Psychiatric:        Mood and Affect: Mood normal.     ED Results / Procedures / Treatments   Labs (all labs ordered are listed, but only abnormal results are displayed) Labs Reviewed  URINALYSIS, ROUTINE W REFLEX MICROSCOPIC - Abnormal; Notable for the following components:      Result Value   Hgb urine dipstick SMALL (*)    Leukocytes,Ua SMALL (*)    Bacteria, UA RARE (*)    All other components within normal limits  CBG MONITORING, ED - Abnormal; Notable for the following components:   Glucose-Capillary 127 (*)    All other components within normal limits  I-STAT VENOUS BLOOD GAS, ED - Abnormal; Notable for the following components:   pO2, Ven 70.0 (*)    All other components within normal limits  BASIC METABOLIC PANEL  CBC  I-STAT BETA HCG BLOOD, ED ([facility], [facility], [facility] ONLY)  TROPONIN I (HIGH SENSITIVITY)  TROPONIN I (HIGH SENSITIVITY)    EKG EKG Interpretation  Date/Time:  Monday June 18 2020 13:31:21 EDT Ventricular Rate:  71 PR Interval:    QRS Duration: 76 QT Interval:  377 QTC Calculation: 410 R Axis:   78 Text Interpretation: Sinus rhythm Borderline T wave abnormalities When compared to prior, similar apperance. No STEMI Confirmed by Theda Belfast ([id number]) on 06/18/2020 1:36:51 PM   Radiology No results found.  Procedures Procedures (including critical care time)  Medications Ordered in ED Medications - No data to display  ED Course  I have reviewed the triage vital signs and the  nursing notes.  Pertinent labs & imaging results that were available during my care of the patient were reviewed by me and considered in my medical decision making (see chart for details).    MDM Rules/Calculators/A&P                          I have personally reviewed all imaging, labs and have interpreted them.  Patient presents with chest pain started yesterday.  She was alert, did not appear in acute distress, vital signs reassuring.  Will order screening labs EKG for further evaluation.  CBC does not show leukocytosis or signs of anemia.  BMP negative for electrolyte abnormalities, no metabolic acidosis, no AKI, no anion gap noted. UA shows negative for nitrates or red blood cells, small leukocytes, rare bacteria.  Troponin less than 2.  EKG was sinus rhythm at signs of ischemia.    I have low suspicion for ACS as history is atypical pain is more substernal happens postprandial, describes the pain as a burning sensation, has no cardiac history.  Initial troponin is less than 2, will defer second troponin as per part way pathway chest pain was over 12 hours ago. Low suspicion for PE as patient has little risk factors she is PERC negative.  Low suspicion for acute abdomen requiring surgical intervention like stomach ulcer with perforation as she was tolerating p.o. without difficulty, no peritoneal sign noted on exam.  Low suspicion for UTI or pyelonephritis as she denies urinary symptoms, no CVA tenderness on exam, UA unremarkable.  Low suspicion for systemic infection as patient is nontoxic-appearing, vital signs reassuring, no obvious source infection on exam.  I  suspect patient's chest pain is multifactorial consisting of anxiety and GERD.  I recommend patient to continue with her PPI and follow-up closely with her GI doctor for further evaluation.  Patient's vital signs remained stable, no indication for hospital admission. Patient was discussed with attending who agrees assessment and plan.  Patient is given at home schedule strict return precautions. Patient verbalized that she understood and agreed to said plan. Final Clinical Impression(s) / ED Diagnoses Final diagnoses:  Atypical chest pain    Rx / DC Orders ED Discharge Orders    None       Carroll Sage, PA-C 06/18/20 1627    Tegeler, Canary Brim, MD 06/21/20 1347

## 2020-06-18 NOTE — ED Notes (Signed)
Pt refused blood draw. PA [employer] notified.

## 2020-06-18 NOTE — Discharge Instructions (Addendum)
You have been seen for chest pain.  Lab work and imaging all looks reassuring.  I recommend continue with your acid pills and limit sodas, caffeine, carbonated drinks, spicy foods, acidic foods, alcohol as these can worsen acid reflux.  Also provide you with ways to help manage stress as this can also increase acid reflux.  It is important that you follow-up with your GI doctor for further evaluation.  Come back to emergency department if develop chest pain, shortness of breath, severe abdominal pain, uncontrolled nausea vomiting diarrhea.

## 2020-06-18 NOTE — ED Notes (Signed)
Pt wanted me to retake bp she states she is feeling weird. I notified RN

## 2020-06-19 DIAGNOSIS — R0789 Other chest pain: Secondary | ICD-10-CM | POA: Diagnosis not present

## 2020-06-19 DIAGNOSIS — R002 Palpitations: Secondary | ICD-10-CM | POA: Diagnosis not present

## 2020-06-21 DIAGNOSIS — F410 Panic disorder [episodic paroxysmal anxiety] without agoraphobia: Secondary | ICD-10-CM | POA: Diagnosis not present

## 2020-06-23 DIAGNOSIS — R1013 Epigastric pain: Secondary | ICD-10-CM | POA: Diagnosis not present

## 2020-06-23 DIAGNOSIS — R0789 Other chest pain: Secondary | ICD-10-CM | POA: Diagnosis not present

## 2020-06-24 ENCOUNTER — Encounter: Payer: Self-pay | Admitting: Certified Registered Nurse Anesthetist

## 2020-06-24 DIAGNOSIS — R079 Chest pain, unspecified: Secondary | ICD-10-CM | POA: Diagnosis not present

## 2020-06-25 ENCOUNTER — Encounter (HOSPITAL_COMMUNITY): Payer: Self-pay

## 2020-06-25 ENCOUNTER — Emergency Department (HOSPITAL_COMMUNITY): Payer: BC Managed Care – PPO

## 2020-06-25 ENCOUNTER — Encounter: Payer: BC Managed Care – PPO | Admitting: Gastroenterology

## 2020-06-25 ENCOUNTER — Emergency Department (HOSPITAL_COMMUNITY)
Admission: EM | Admit: 2020-06-25 | Discharge: 2020-06-25 | Disposition: A | Discharge status: Home / Self Care | Payer: BC Managed Care – PPO | Attending: Emergency Medicine | Admitting: Emergency Medicine

## 2020-06-25 DIAGNOSIS — R0789 Other chest pain: Secondary | ICD-10-CM

## 2020-06-25 DIAGNOSIS — I498 Other specified cardiac arrhythmias: Secondary | ICD-10-CM | POA: Diagnosis not present

## 2020-06-25 DIAGNOSIS — Z87891 Personal history of nicotine dependence: Secondary | ICD-10-CM | POA: Insufficient documentation

## 2020-06-25 DIAGNOSIS — R008 Other abnormalities of heart beat: Secondary | ICD-10-CM | POA: Diagnosis not present

## 2020-06-25 LAB — CBC
HCT: 40.2 % (ref 36.0–46.0)
Hemoglobin: 13.7 g/dL (ref 12.0–15.0)
MCH: 33.6 pg (ref 26.0–34.0)
MCHC: 34.1 g/dL (ref 30.0–36.0)
MCV: 98.5 fL (ref 80.0–100.0)
Platelets: 212 10*3/uL (ref 150–400)
RBC: 4.08 MIL/uL (ref 3.87–5.11)
RDW: 12 % (ref 11.5–15.5)
WBC: 8.4 10*3/uL (ref 4.0–10.5)
nRBC: 0 % (ref 0.0–0.2)

## 2020-06-25 LAB — BASIC METABOLIC PANEL
Anion gap: 12 (ref 5–15)
BUN: 14 mg/dL (ref 6–20)
CO2: 26 mmol/L (ref 22–32)
Calcium: 9.4 mg/dL (ref 8.9–10.3)
Chloride: 102 mmol/L (ref 98–111)
Creatinine, Ser: 0.83 mg/dL (ref 0.44–1.00)
GFR, Estimated: >60 mL/min (ref >60)
Glucose, Bld: 103 mg/dL — ABNORMAL HIGH (ref 70–99)
Potassium: 3.7 mmol/L (ref 3.5–5.1)
Sodium: 140 mmol/L (ref 135–145)

## 2020-06-25 LAB — TROPONIN I (HIGH SENSITIVITY)
Troponin I (High Sensitivity): <2 ng/L (ref <18)
Troponin I (High Sensitivity): <2 ng/L (ref <18)

## 2020-06-25 LAB — I-STAT BETA HCG BLOOD, ED (MC, WL, AP ONLY): I-stat hCG, quantitative: <5 m[IU]/mL (ref <5)

## 2020-06-25 MED ORDER — FAMOTIDINE IN NACL 20-0.9 MG/50ML-% IV SOLN: 20.0000 mg | Freq: Once | INTRAVENOUS | Status: DC

## 2020-06-25 NOTE — ED Triage Notes (Signed)
Pt comes via [facility] for CP that has been going on all day, some SOB, denies n/v

## 2020-06-25 NOTE — ED Provider Notes (Signed)
[facility] EMERGENCY DEPARTMENT Provider Note   CSN: [id number] Arrival date & time: 06/25/20  0107     History Chief Complaint  Patient presents with  . Chest Pain    Donna Leach is a 29 y.o. female history of anemia, anxiety, depression, thyroid disease.  Patient presents today for chest pain she describes center left side of her chest tightness moderate intensity intermittent lasts 1-2 hours at a time before spontaneously resolving occurs 3-4 times per day, pain worse at night, will occasionally radiate to her left arm.  Denies any active pain.  She reports she has had a history of this pain for several months and has been seen multiple times by both cardiology and in the ED without diagnosis.  She currently has a Zio patch on but is awaiting results from her cardiologist.  She is concerned that her apple watch shows that her heart rate drops down into the 30s while sleeping at night.  Associated with shortness of breath when pain occurs.  Denies fever/chills, fall/injury, headache/vision changes, hemoptysis, history of blood clot, extremity swelling/color change, history of cancer, recent surgery/immobilization, exogenous hormone use, abdominal pain, nausea/vomiting, diarrhea or any additional concerns. HPI     Past Medical History:  Diagnosis Date  . Anemia   . Anxiety   . Depression     Patient Active Problem List   Diagnosis Date Noted  . Major depressive disorder, recurrent episode, moderate (HCC)   . Generalized anxiety disorder   . Thyroiditis 11/04/2018  . Traumatic hematoma of finger 11/06/2016  . Depression 04/08/2016    Past Surgical History:  Procedure Laterality Date  . Adnoids    . APPENDECTOMY       OB History    Gravida  0   Para      Term      Preterm      AB      Living        SAB      TAB      Ectopic      Multiple      Live Births              Family History  Problem Relation Age of Onset   . Hypertension Father   . Diabetes Father   . Diabetes Mother   . Hyperthyroidism Other   . Esophageal cancer Neg Hx   . Colon cancer Neg Hx   . Liver disease Neg Hx   . Stomach cancer Neg Hx   . Pancreatic cancer Neg Hx     Social History   Tobacco Use  . Smoking status: Former Smoker    Years: 2.00    Quit date: 11/14/2016    Years since quitting: 3.6  . Smokeless tobacco: Never Used  . Tobacco comment: no cigarettes in 3 months  Vaping Use  . Vaping Use: Never used  Substance Use Topics  . Alcohol use: Not Currently  . Drug use: No    Home Medications Prior to Admission medications   Medication Sig Start Date End Date Taking? Authorizing Provider  ALPRAZolam (XANAX) 0.25 MG tablet Take 1 tablet (0.25 mg total) by mouth 2 (two) times daily as needed for anxiety. Patient not taking: Reported on 06/14/2020 06/08/20   Philip Aspen, Limmie Patricia, MD  pantoprazole (PROTONIX) 40 MG tablet Take 1 tablet (40 mg total) by mouth daily. Patient not taking: Reported on 06/14/2020 06/13/20   Philip Aspen, Limmie Patricia, MD  potassium  chloride SA (KLOR-CON) 20 MEQ tablet Take 1 tablet (20 mEq total) by mouth 2 (two) times daily. Patient not taking: Reported on 06/14/2020 06/06/20   Dione BoozeGlick, David, MD  sertraline (ZOLOFT) 50 MG tablet Take 1 tablet (50 mg total) by mouth daily. 06/08/20   Philip AspenHernandez Acosta, Limmie PatriciaEstela Y, MD  sucralfate (CARAFATE) 1 g tablet Take 1 tablet (1 g total) by mouth 4 (four) times daily -  with meals and at bedtime for 14 days. Patient not taking: Reported on 06/14/2020 06/07/20 06/21/20  Gailen ShelterFondaw, Wylder S, PA  traZODone (DESYREL) 50 MG tablet Take 1 tablet (50 mg total) by mouth at bedtime. Patient not taking: Reported on 06/14/2020 06/12/20   Merwyn KatosBradler, Evan K, MD    Allergies    Penicillins, Amoxicillin, and Cefixime  Review of Systems   Review of Systems Ten systems are reviewed and are negative for acute change except as noted in the HPI  Physical Exam Updated Vital  Signs BP 106/72   Pulse (!) 37   Temp 98.7 F (37.1 C) (Oral)   Resp 16   SpO2 100%   Physical Exam Constitutional:      General: She is not in acute distress.    Appearance: Normal appearance. She is well-developed. She is not ill-appearing or diaphoretic.  HENT:     Head: Normocephalic and atraumatic.  Eyes:     General: Vision grossly intact. Gaze aligned appropriately.     Pupils: Pupils are equal, round, and reactive to light.  Neck:     Trachea: Trachea and phonation normal.  Cardiovascular:     Rate and Rhythm: Normal rate and regular rhythm.     Pulses:          Dorsalis pedis pulses are 2+ on the right side and 2+ on the left side.  Pulmonary:     Effort: Pulmonary effort is normal. No respiratory distress.     Breath sounds: Normal breath sounds.  Abdominal:     General: There is no distension.     Palpations: Abdomen is soft.     Tenderness: There is no abdominal tenderness. There is no guarding or rebound.  Musculoskeletal:        General: Normal range of motion.     Cervical back: Normal range of motion.     Right lower leg: No tenderness. No edema.     Left lower leg: No tenderness. No edema.  Skin:    General: Skin is warm and dry.  Neurological:     Mental Status: She is alert.     GCS: GCS eye subscore is 4. GCS verbal subscore is 5. GCS motor subscore is 6.     Comments: Speech is clear and goal oriented, follows commands Major Cranial nerves without deficit, no facial droop Moves extremities without ataxia, coordination intact  Psychiatric:        Behavior: Behavior normal.     ED Results / Procedures / Treatments   Labs (all labs ordered are listed, but only abnormal results are displayed) Labs Reviewed  BASIC METABOLIC PANEL - Abnormal; Notable for the following components:      Result Value   Glucose, Bld 103 (*)    All other components within normal limits  CBC  I-STAT BETA HCG BLOOD, ED ([facility], [facility], [facility] ONLY)  TROPONIN I (HIGH SENSITIVITY)   TROPONIN I (HIGH SENSITIVITY)    EKG EKG Interpretation  Date/Time:  Monday June 25 2020 01:21:06 EDT Ventricular Rate:  74 PR Interval:  150 QRS Duration: 76 QT Interval:  396 QTC Calculation: 439 R Axis:   73 Text Interpretation: Normal sinus rhythm Normal ECG When compared with ECG of 06/18/2020, Nonspecific T wave abnormality is no longer present Confirmed by Dione Booze ([id number]) on 06/25/2020 1:25:38 AM   Radiology No results found.  Procedures Procedures (including critical care time)  Medications Ordered in ED Medications  famotidine (PEPCID) IVPB 20 mg premix (20 mg Intravenous Refused 06/25/20 0954)    ED Course  I have reviewed the triage vital signs and the nursing notes.  Pertinent labs & imaging results that were available during my care of the patient were reviewed by me and considered in my medical decision making (see chart for details).    MDM Rules/Calculators/A&P                         Additional history obtained from: 1. Nursing notes from this visit. 2. Review of electronic medical record.  Patient had urgent care visit 2 days ago for atypical chest pain and epigastric pain.  She was prescribed viscous lidocaine.  Patient was seen at [facility] cardiology office 6 days ago, diagnosis of chest discomfort and palpitations, she was prescribed propanolol 10 mg twice daily, it appears she is post to start taking 1/2 tablet.  Patient reports that she has not started taking her propanolol. She was given Holter monitor.  Patient is scheduled to follow-up with gastroenterology.  Patient was seen in the ER 8 days ago had a diagnosis of atypical chest pain after negative work-up.  Patient was also seen on June 15, 2020 diagnosis of lung nodule and precordial pain.  It was noted patient had a CT angio of her chest during some previous ER visit and the nodule was noted given she is a smoker she was encouraged to have a follow-up.  Patient had a GI visit on 06/14/2020  diagnosis of postprandial chest burning most likely GERD, she was started on omeprazole and schedule an EGD.  Thyroid studies T4 and TSH were within normal limits on June 07, 2020.  This is the patient's 19th ER or urgent care visit in the last month. - 29 year old female presented for atypical chest tightness.  Overall she is well-appearing no acute distress.  Cardiopulmonary exam within normal limits.  Abdomen soft nontender.  Neurovascular intact to all 4 extremities without evidence of DVT.  She has no tachycardia or hypoxia on room air.  Low risk by Wells criteria PERC negative.  Initial troponin negative with ongoing pain for several weeks no indication for a delta troponin.  Patient has had extensive work-up in the past including CT angio which were negative for acute findings. She refused chest x-ray, she reports to me that she has had 10-12 chest x-rays in the last month which have been normal, cardiopulmonary exam within normal limits feel this is a reasonable request by the patient low suspicion for pneumonia, PTX or other emergent cardiothoracic etiology of symptoms.  Patient also refused thyroid studies and Pepcid. Orthostatic vital signs obtained and are within normal limits without evidence of orthostasis. - The delta troponin was drawn prior to discharge will follow up before discharge. - Cardiac monitor reviewed, she had a documented pulse of 37 in the emergency department. On monitor review patient appears to have short episodes of ventricular bigeminy which likely explains the bradycardia on her apple watch.  Patient denies any syncopal episodes or symptoms while in the emergency department during the  bigeminy. Patient has a Holter monitor in place, encouraged to follow-up with her cardiologist. Also encourage patient to maintain her gastroenterologist appointment as well. I encourage patient to continue all of her home medications as prescribed by her specialists and PCP. - Delta  troponin negative. CBC within normal limits no anemia or leukocytosis. Pregnancy test negative. BMP without emergent derangement, AKI or gap.  At this time there does not appear to be any evidence of an acute emergency medical condition and the patient appears stable for discharge with appropriate outpatient follow up. Diagnosis was discussed with patient who verbalizes understanding of care plan and is agreeable to discharge. I have discussed return precautions with patient who verbalizes understanding. Patient encouraged to follow-up with their PCP, cardiology and gastroenterology. All questions answered.  Patient's case discussed with Dr. Bernette Mayers, agrees with discharge and cardiology follow-up for ventricular bigeminy and atypical chest pain.  Note: Portions of this report may have been transcribed using voice recognition software. Every effort was made to ensure accuracy; however, inadvertent computerized transcription errors may still be present. Final Clinical Impression(s) / ED Diagnoses Final diagnoses:  Atypical chest pain  Ventricular bigeminy seen on cardiac monitor    Rx / DC Orders ED Discharge Orders    None       Elizabeth Palau 06/25/20 1038    Pollyann Savoy, MD 06/25/20 1326

## 2020-06-25 NOTE — ED Notes (Signed)
Pt refused second troponin, pt stated "I will wait until I see a doctor".

## 2020-06-25 NOTE — Discharge Instructions (Addendum)
At this time there does not appear to be the presence of an emergent medical condition, however there is always the potential for conditions to change. Please read and follow the below instructions.  Please return to the Emergency Department immediately for any new or worsening symptoms. Please be sure to follow up with your Primary Care Provider within one week regarding your visit today; please call their office to schedule an appointment even if you are feeling better for a follow-up visit. Be sure to follow-up with your cardiologist and gastroenterologist as scheduled.  Be sure to take all of your home medications including your antacid medications and your panel as prescribed by your specialist.  Please drink plenty water and get plenty of rest.  Go to the nearest Emergency Department immediately if: You have fever or chills Your chest pain is worse. You have a cough that gets worse, or you cough up blood. You have very bad (severe) pain in your belly (abdomen). You pass out (faint). You have either of these for no clear reason: Sudden chest discomfort. Sudden discomfort in your arms, back, neck, or jaw. You have shortness of breath at any time. You suddenly start to sweat, or your skin gets clammy. You feel sick to your stomach (nauseous). You throw up (vomit). You suddenly feel lightheaded or dizzy. You feel very weak or tired. Your heart starts to beat fast, or it feels like it is skipping beats. You have any new/concerning or worsening of symptoms  Please read the additional information packets attached to your discharge summary.  Do not take your medicine if  develop an itchy rash, swelling in your mouth or lips, or difficulty breathing; call 911 and seek immediate emergency medical attention if this occurs.  You may review your lab tests and imaging results in their entirety on your MyChart account.  Please discuss all results of fully with your primary care provider and other  specialist at your follow-up visit.  Note: Portions of this text may have been transcribed using voice recognition software. Every effort was made to ensure accuracy; however, inadvertent computerized transcription errors may still be present.

## 2020-06-25 NOTE — ED Notes (Signed)
Pt refusing xray.

## 2020-06-25 NOTE — ED Notes (Signed)
Patient verbalized understanding of discharge instructions. Opportunity for questions and answers.  

## 2020-06-26 DIAGNOSIS — M9901 Segmental and somatic dysfunction of cervical region: Secondary | ICD-10-CM | POA: Diagnosis not present

## 2020-06-26 DIAGNOSIS — M9902 Segmental and somatic dysfunction of thoracic region: Secondary | ICD-10-CM | POA: Diagnosis not present

## 2020-06-26 DIAGNOSIS — M9903 Segmental and somatic dysfunction of lumbar region: Secondary | ICD-10-CM | POA: Diagnosis not present

## 2020-06-26 DIAGNOSIS — M9905 Segmental and somatic dysfunction of pelvic region: Secondary | ICD-10-CM | POA: Diagnosis not present

## 2020-06-29 ENCOUNTER — Encounter: Payer: Self-pay | Admitting: *Deleted

## 2020-06-29 ENCOUNTER — Other Ambulatory Visit: Payer: Self-pay | Admitting: *Deleted

## 2020-06-29 DIAGNOSIS — K219 Gastro-esophageal reflux disease without esophagitis: Secondary | ICD-10-CM | POA: Insufficient documentation

## 2020-06-29 NOTE — Telephone Encounter (Signed)
Prior Auth Key: [id number] Protonix

## 2020-07-05 DIAGNOSIS — L718 Other rosacea: Secondary | ICD-10-CM | POA: Diagnosis not present

## 2020-07-06 DIAGNOSIS — M9902 Segmental and somatic dysfunction of thoracic region: Secondary | ICD-10-CM | POA: Diagnosis not present

## 2020-07-06 DIAGNOSIS — M9905 Segmental and somatic dysfunction of pelvic region: Secondary | ICD-10-CM | POA: Diagnosis not present

## 2020-07-06 DIAGNOSIS — M9903 Segmental and somatic dysfunction of lumbar region: Secondary | ICD-10-CM | POA: Diagnosis not present

## 2020-07-06 DIAGNOSIS — M9901 Segmental and somatic dysfunction of cervical region: Secondary | ICD-10-CM | POA: Diagnosis not present

## 2020-07-10 DIAGNOSIS — R0789 Other chest pain: Secondary | ICD-10-CM | POA: Diagnosis not present

## 2020-07-10 DIAGNOSIS — M9903 Segmental and somatic dysfunction of lumbar region: Secondary | ICD-10-CM | POA: Diagnosis not present

## 2020-07-10 DIAGNOSIS — I491 Atrial premature depolarization: Secondary | ICD-10-CM | POA: Diagnosis not present

## 2020-07-10 DIAGNOSIS — I499 Cardiac arrhythmia, unspecified: Secondary | ICD-10-CM | POA: Diagnosis not present

## 2020-07-10 DIAGNOSIS — M9905 Segmental and somatic dysfunction of pelvic region: Secondary | ICD-10-CM | POA: Diagnosis not present

## 2020-07-10 DIAGNOSIS — M9901 Segmental and somatic dysfunction of cervical region: Secondary | ICD-10-CM | POA: Diagnosis not present

## 2020-07-10 DIAGNOSIS — M9902 Segmental and somatic dysfunction of thoracic region: Secondary | ICD-10-CM | POA: Diagnosis not present

## 2020-07-10 DIAGNOSIS — R079 Chest pain, unspecified: Secondary | ICD-10-CM | POA: Diagnosis not present

## 2020-07-11 DIAGNOSIS — R002 Palpitations: Secondary | ICD-10-CM | POA: Diagnosis not present

## 2020-07-11 DIAGNOSIS — R0789 Other chest pain: Secondary | ICD-10-CM | POA: Diagnosis not present

## 2020-07-16 DIAGNOSIS — R002 Palpitations: Secondary | ICD-10-CM | POA: Diagnosis not present

## 2020-07-16 DIAGNOSIS — R0789 Other chest pain: Secondary | ICD-10-CM | POA: Diagnosis not present

## 2020-07-25 DIAGNOSIS — R002 Palpitations: Secondary | ICD-10-CM | POA: Diagnosis not present

## 2020-07-25 DIAGNOSIS — I493 Ventricular premature depolarization: Secondary | ICD-10-CM | POA: Diagnosis not present

## 2020-08-05 DIAGNOSIS — Z20822 Contact with and (suspected) exposure to covid-19: Secondary | ICD-10-CM | POA: Diagnosis not present

## 2020-08-13 DIAGNOSIS — Z20822 Contact with and (suspected) exposure to covid-19: Secondary | ICD-10-CM | POA: Diagnosis not present

## 2020-08-14 DIAGNOSIS — M7918 Myalgia, other site: Secondary | ICD-10-CM | POA: Diagnosis not present

## 2020-08-14 DIAGNOSIS — M549 Dorsalgia, unspecified: Secondary | ICD-10-CM | POA: Diagnosis not present

## 2020-08-14 DIAGNOSIS — M542 Cervicalgia: Secondary | ICD-10-CM | POA: Diagnosis not present

## 2020-08-14 DIAGNOSIS — M461 Sacroiliitis, not elsewhere classified: Secondary | ICD-10-CM | POA: Diagnosis not present

## 2020-08-15 DIAGNOSIS — M5442 Lumbago with sciatica, left side: Secondary | ICD-10-CM | POA: Diagnosis not present

## 2020-08-15 DIAGNOSIS — M9904 Segmental and somatic dysfunction of sacral region: Secondary | ICD-10-CM | POA: Diagnosis not present

## 2020-08-15 DIAGNOSIS — M5387 Other specified dorsopathies, lumbosacral region: Secondary | ICD-10-CM | POA: Diagnosis not present

## 2020-08-15 DIAGNOSIS — M62830 Muscle spasm of back: Secondary | ICD-10-CM | POA: Diagnosis not present

## 2020-08-15 DIAGNOSIS — M9903 Segmental and somatic dysfunction of lumbar region: Secondary | ICD-10-CM | POA: Diagnosis not present

## 2020-08-15 DIAGNOSIS — M542 Cervicalgia: Secondary | ICD-10-CM | POA: Diagnosis not present

## 2020-08-15 DIAGNOSIS — M9901 Segmental and somatic dysfunction of cervical region: Secondary | ICD-10-CM | POA: Diagnosis not present

## 2020-08-16 DIAGNOSIS — M9901 Segmental and somatic dysfunction of cervical region: Secondary | ICD-10-CM | POA: Diagnosis not present

## 2020-08-16 DIAGNOSIS — M5442 Lumbago with sciatica, left side: Secondary | ICD-10-CM | POA: Diagnosis not present

## 2020-08-16 DIAGNOSIS — M5387 Other specified dorsopathies, lumbosacral region: Secondary | ICD-10-CM | POA: Diagnosis not present

## 2020-08-16 DIAGNOSIS — M9904 Segmental and somatic dysfunction of sacral region: Secondary | ICD-10-CM | POA: Diagnosis not present

## 2020-08-16 DIAGNOSIS — M62830 Muscle spasm of back: Secondary | ICD-10-CM | POA: Diagnosis not present

## 2020-08-16 DIAGNOSIS — M542 Cervicalgia: Secondary | ICD-10-CM | POA: Diagnosis not present

## 2020-08-16 DIAGNOSIS — M9903 Segmental and somatic dysfunction of lumbar region: Secondary | ICD-10-CM | POA: Diagnosis not present

## 2020-08-17 DIAGNOSIS — R0981 Nasal congestion: Secondary | ICD-10-CM | POA: Diagnosis not present

## 2020-08-17 DIAGNOSIS — R079 Chest pain, unspecified: Secondary | ICD-10-CM | POA: Diagnosis not present

## 2020-08-17 DIAGNOSIS — M549 Dorsalgia, unspecified: Secondary | ICD-10-CM | POA: Diagnosis not present

## 2020-08-17 DIAGNOSIS — Z20822 Contact with and (suspected) exposure to covid-19: Secondary | ICD-10-CM | POA: Diagnosis not present

## 2020-08-19 DIAGNOSIS — I493 Ventricular premature depolarization: Secondary | ICD-10-CM | POA: Diagnosis not present

## 2020-08-20 DIAGNOSIS — R5382 Chronic fatigue, unspecified: Secondary | ICD-10-CM | POA: Diagnosis not present

## 2020-08-20 DIAGNOSIS — R6889 Other general symptoms and signs: Secondary | ICD-10-CM | POA: Diagnosis not present

## 2020-08-20 DIAGNOSIS — I493 Ventricular premature depolarization: Secondary | ICD-10-CM | POA: Diagnosis not present

## 2020-08-21 DIAGNOSIS — M62830 Muscle spasm of back: Secondary | ICD-10-CM | POA: Diagnosis not present

## 2020-08-21 DIAGNOSIS — M9904 Segmental and somatic dysfunction of sacral region: Secondary | ICD-10-CM | POA: Diagnosis not present

## 2020-08-21 DIAGNOSIS — M9903 Segmental and somatic dysfunction of lumbar region: Secondary | ICD-10-CM | POA: Diagnosis not present

## 2020-08-21 DIAGNOSIS — M5442 Lumbago with sciatica, left side: Secondary | ICD-10-CM | POA: Diagnosis not present

## 2020-08-21 DIAGNOSIS — M5387 Other specified dorsopathies, lumbosacral region: Secondary | ICD-10-CM | POA: Diagnosis not present

## 2020-08-21 DIAGNOSIS — M9901 Segmental and somatic dysfunction of cervical region: Secondary | ICD-10-CM | POA: Diagnosis not present

## 2020-08-21 DIAGNOSIS — M542 Cervicalgia: Secondary | ICD-10-CM | POA: Diagnosis not present

## 2020-08-23 DIAGNOSIS — M5442 Lumbago with sciatica, left side: Secondary | ICD-10-CM | POA: Diagnosis not present

## 2020-08-23 DIAGNOSIS — M542 Cervicalgia: Secondary | ICD-10-CM | POA: Diagnosis not present

## 2020-08-23 DIAGNOSIS — M9901 Segmental and somatic dysfunction of cervical region: Secondary | ICD-10-CM | POA: Diagnosis not present

## 2020-08-23 DIAGNOSIS — M5387 Other specified dorsopathies, lumbosacral region: Secondary | ICD-10-CM | POA: Diagnosis not present

## 2020-08-23 DIAGNOSIS — M9904 Segmental and somatic dysfunction of sacral region: Secondary | ICD-10-CM | POA: Diagnosis not present

## 2020-08-23 DIAGNOSIS — M62830 Muscle spasm of back: Secondary | ICD-10-CM | POA: Diagnosis not present

## 2020-08-23 DIAGNOSIS — M9903 Segmental and somatic dysfunction of lumbar region: Secondary | ICD-10-CM | POA: Diagnosis not present

## 2020-08-24 DIAGNOSIS — Z20822 Contact with and (suspected) exposure to covid-19: Secondary | ICD-10-CM | POA: Diagnosis not present

## 2020-08-27 DIAGNOSIS — Z20822 Contact with and (suspected) exposure to covid-19: Secondary | ICD-10-CM | POA: Diagnosis not present

## 2020-09-02 DIAGNOSIS — Z20822 Contact with and (suspected) exposure to covid-19: Secondary | ICD-10-CM | POA: Diagnosis not present

## 2020-09-13 DIAGNOSIS — Z20822 Contact with and (suspected) exposure to covid-19: Secondary | ICD-10-CM | POA: Diagnosis not present

## 2020-09-17 DIAGNOSIS — R002 Palpitations: Secondary | ICD-10-CM | POA: Diagnosis not present

## 2020-09-17 DIAGNOSIS — I493 Ventricular premature depolarization: Secondary | ICD-10-CM | POA: Diagnosis not present

## 2020-09-21 DIAGNOSIS — Z20822 Contact with and (suspected) exposure to covid-19: Secondary | ICD-10-CM | POA: Diagnosis not present

## 2020-09-27 DIAGNOSIS — Z20822 Contact with and (suspected) exposure to covid-19: Secondary | ICD-10-CM | POA: Diagnosis not present

## 2020-10-02 DIAGNOSIS — Z20822 Contact with and (suspected) exposure to covid-19: Secondary | ICD-10-CM | POA: Diagnosis not present

## 2020-10-03 DIAGNOSIS — L718 Other rosacea: Secondary | ICD-10-CM | POA: Diagnosis not present

## 2020-10-03 DIAGNOSIS — L659 Nonscarring hair loss, unspecified: Secondary | ICD-10-CM | POA: Diagnosis not present

## 2020-10-06 DIAGNOSIS — Z20822 Contact with and (suspected) exposure to covid-19: Secondary | ICD-10-CM | POA: Diagnosis not present

## 2020-10-10 DIAGNOSIS — Z20822 Contact with and (suspected) exposure to covid-19: Secondary | ICD-10-CM | POA: Diagnosis not present

## 2020-10-13 DIAGNOSIS — Z20822 Contact with and (suspected) exposure to covid-19: Secondary | ICD-10-CM | POA: Diagnosis not present

## 2020-10-18 DIAGNOSIS — Z20822 Contact with and (suspected) exposure to covid-19: Secondary | ICD-10-CM | POA: Diagnosis not present

## 2020-10-25 DIAGNOSIS — R590 Localized enlarged lymph nodes: Secondary | ICD-10-CM | POA: Diagnosis not present

## 2020-10-25 DIAGNOSIS — Z8639 Personal history of other endocrine, nutritional and metabolic disease: Secondary | ICD-10-CM | POA: Diagnosis not present

## 2020-10-25 DIAGNOSIS — E559 Vitamin D deficiency, unspecified: Secondary | ICD-10-CM | POA: Diagnosis not present

## 2020-10-26 DIAGNOSIS — Z3046 Encounter for surveillance of implantable subdermal contraceptive: Secondary | ICD-10-CM | POA: Diagnosis not present

## 2020-11-29 DIAGNOSIS — Z20822 Contact with and (suspected) exposure to covid-19: Secondary | ICD-10-CM | POA: Diagnosis not present

## 2020-11-30 DIAGNOSIS — Z1329 Encounter for screening for other suspected endocrine disorder: Secondary | ICD-10-CM | POA: Diagnosis not present

## 2020-11-30 DIAGNOSIS — R5383 Other fatigue: Secondary | ICD-10-CM | POA: Diagnosis not present

## 2020-11-30 DIAGNOSIS — R32 Unspecified urinary incontinence: Secondary | ICD-10-CM | POA: Diagnosis not present

## 2020-12-05 DIAGNOSIS — R5383 Other fatigue: Secondary | ICD-10-CM | POA: Diagnosis not present

## 2020-12-05 DIAGNOSIS — T447X6A Underdosing of beta-adrenoreceptor antagonists, initial encounter: Secondary | ICD-10-CM | POA: Diagnosis not present

## 2020-12-05 DIAGNOSIS — Z87891 Personal history of nicotine dependence: Secondary | ICD-10-CM | POA: Diagnosis not present

## 2020-12-05 DIAGNOSIS — Z91128 Patient's intentional underdosing of medication regimen for other reason: Secondary | ICD-10-CM | POA: Diagnosis not present

## 2020-12-05 DIAGNOSIS — R002 Palpitations: Secondary | ICD-10-CM | POA: Diagnosis not present

## 2020-12-05 DIAGNOSIS — I493 Ventricular premature depolarization: Secondary | ICD-10-CM | POA: Diagnosis not present

## 2020-12-05 DIAGNOSIS — R079 Chest pain, unspecified: Secondary | ICD-10-CM | POA: Diagnosis not present

## 2020-12-06 DIAGNOSIS — Z8639 Personal history of other endocrine, nutritional and metabolic disease: Secondary | ICD-10-CM | POA: Diagnosis not present

## 2020-12-06 DIAGNOSIS — R590 Localized enlarged lymph nodes: Secondary | ICD-10-CM | POA: Diagnosis not present

## 2020-12-08 DIAGNOSIS — R079 Chest pain, unspecified: Secondary | ICD-10-CM | POA: Diagnosis not present

## 2020-12-08 DIAGNOSIS — I493 Ventricular premature depolarization: Secondary | ICD-10-CM | POA: Diagnosis not present

## 2020-12-08 DIAGNOSIS — R0789 Other chest pain: Secondary | ICD-10-CM | POA: Diagnosis not present

## 2020-12-13 DIAGNOSIS — I493 Ventricular premature depolarization: Secondary | ICD-10-CM | POA: Diagnosis not present

## 2020-12-17 DIAGNOSIS — L718 Other rosacea: Secondary | ICD-10-CM | POA: Diagnosis not present

## 2020-12-28 DIAGNOSIS — R002 Palpitations: Secondary | ICD-10-CM | POA: Diagnosis not present

## 2021-01-02 DIAGNOSIS — S060X0A Concussion without loss of consciousness, initial encounter: Secondary | ICD-10-CM | POA: Diagnosis not present

## 2021-01-02 DIAGNOSIS — Z20822 Contact with and (suspected) exposure to covid-19: Secondary | ICD-10-CM | POA: Diagnosis not present

## 2021-01-07 DIAGNOSIS — Z20822 Contact with and (suspected) exposure to covid-19: Secondary | ICD-10-CM | POA: Diagnosis not present

## 2021-01-08 DIAGNOSIS — R002 Palpitations: Secondary | ICD-10-CM | POA: Diagnosis not present

## 2021-01-15 DIAGNOSIS — Z20828 Contact with and (suspected) exposure to other viral communicable diseases: Secondary | ICD-10-CM | POA: Diagnosis not present

## 2021-01-17 DIAGNOSIS — Z20822 Contact with and (suspected) exposure to covid-19: Secondary | ICD-10-CM | POA: Diagnosis not present

## 2021-01-28 DIAGNOSIS — T189XXA Foreign body of alimentary tract, part unspecified, initial encounter: Secondary | ICD-10-CM | POA: Diagnosis not present

## 2021-03-14 DIAGNOSIS — N949 Unspecified condition associated with female genital organs and menstrual cycle: Secondary | ICD-10-CM | POA: Diagnosis not present

## 2021-03-14 DIAGNOSIS — R5382 Chronic fatigue, unspecified: Secondary | ICD-10-CM | POA: Diagnosis not present

## 2021-03-14 DIAGNOSIS — R002 Palpitations: Secondary | ICD-10-CM | POA: Diagnosis not present

## 2021-03-19 DIAGNOSIS — R42 Dizziness and giddiness: Secondary | ICD-10-CM | POA: Diagnosis not present

## 2021-04-14 DIAGNOSIS — R1084 Generalized abdominal pain: Secondary | ICD-10-CM | POA: Diagnosis not present

## 2021-04-25 DIAGNOSIS — L218 Other seborrheic dermatitis: Secondary | ICD-10-CM | POA: Diagnosis not present

## 2021-04-25 DIAGNOSIS — L700 Acne vulgaris: Secondary | ICD-10-CM | POA: Diagnosis not present

## 2021-04-25 DIAGNOSIS — L659 Nonscarring hair loss, unspecified: Secondary | ICD-10-CM | POA: Diagnosis not present

## 2021-04-30 DIAGNOSIS — Z20822 Contact with and (suspected) exposure to covid-19: Secondary | ICD-10-CM | POA: Diagnosis not present

## 2021-05-03 DIAGNOSIS — A059 Bacterial foodborne intoxication, unspecified: Secondary | ICD-10-CM | POA: Diagnosis not present

## 2021-05-09 DIAGNOSIS — R002 Palpitations: Secondary | ICD-10-CM | POA: Diagnosis not present

## 2021-05-09 DIAGNOSIS — I493 Ventricular premature depolarization: Secondary | ICD-10-CM | POA: Diagnosis not present

## 2021-05-09 DIAGNOSIS — Z20822 Contact with and (suspected) exposure to covid-19: Secondary | ICD-10-CM | POA: Diagnosis not present

## 2021-05-15 DIAGNOSIS — R194 Change in bowel habit: Secondary | ICD-10-CM | POA: Diagnosis not present

## 2021-05-15 DIAGNOSIS — K219 Gastro-esophageal reflux disease without esophagitis: Secondary | ICD-10-CM | POA: Diagnosis not present

## 2021-06-11 ENCOUNTER — Ambulatory Visit: Payer: BC Managed Care – PPO | Admitting: Nurse Practitioner

## 2021-07-29 ENCOUNTER — Inpatient Hospital Stay
Admit: 2021-07-29 | Discharge: 2021-07-29 | Disposition: A | Discharge status: Home / Self Care | Payer: BLUE CROSS/BLUE SHIELD | Attending: Emergency Medicine

## 2021-07-29 ENCOUNTER — Emergency Department: Admit: 2021-07-29 | Payer: BLUE CROSS/BLUE SHIELD

## 2021-07-29 DIAGNOSIS — S20219A Contusion of unspecified front wall of thorax, initial encounter: Secondary | ICD-10-CM

## 2021-07-29 DIAGNOSIS — Y92410 Unspecified street and highway as the place of occurrence of the external cause: Secondary | ICD-10-CM | POA: Diagnosis not present

## 2021-07-29 DIAGNOSIS — Z87891 Personal history of nicotine dependence: Secondary | ICD-10-CM | POA: Diagnosis not present

## 2021-07-29 DIAGNOSIS — S50811A Abrasion of right forearm, initial encounter: Secondary | ICD-10-CM | POA: Diagnosis not present

## 2021-07-29 DIAGNOSIS — R079 Chest pain, unspecified: Secondary | ICD-10-CM | POA: Diagnosis not present

## 2021-07-29 MED ORDER — ACETAMINOPHEN 500 MG TAB
500 mg | ORAL | Status: AC
Start: 2021-07-29 — End: 2021-07-29
  Administered 2021-07-29: 20:00:00 via ORAL

## 2021-07-29 MED ORDER — CYCLOBENZAPRINE 10 MG TAB
10 mg | ORAL_TABLET | Freq: Three times a day (TID) | ORAL | 0 refills | Status: AC | PRN
Start: 2021-07-29 — End: ?

## 2021-07-29 MED ORDER — IBUPROFEN 800 MG TAB
800 mg | ORAL | Status: AC
Start: 2021-07-29 — End: 2021-07-29
  Administered 2021-07-29: 20:00:00 via ORAL

## 2021-07-29 MED ORDER — IBUPROFEN 800 MG TAB
800 mg | ORAL_TABLET | Freq: Three times a day (TID) | ORAL | 0 refills | Status: AC | PRN
Start: 2021-07-29 — End: 2021-08-05

## 2021-07-29 MED FILL — ACETAMINOPHEN 500 MG TAB: 500 mg | ORAL | Qty: 1

## 2021-07-29 MED FILL — IBUPROFEN 800 MG TAB: 800 mg | ORAL | Qty: 1
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
## 2021-08-20 DIAGNOSIS — Z91018 Allergy to other foods: Secondary | ICD-10-CM | POA: Diagnosis not present

## 2021-08-20 DIAGNOSIS — Z880 Allergy status to penicillin: Secondary | ICD-10-CM | POA: Diagnosis not present

## 2021-08-20 DIAGNOSIS — R5383 Other fatigue: Secondary | ICD-10-CM | POA: Diagnosis not present

## 2021-08-20 DIAGNOSIS — J101 Influenza due to other identified influenza virus with other respiratory manifestations: Secondary | ICD-10-CM | POA: Diagnosis not present
